# Patient Record
Sex: Male | Born: 1970 | Hispanic: No | Marital: Married | Smoking: Never smoker
Health system: Southern US, Community
[De-identification: ages and names within clinical notes are randomized; demographics above are authoritative.]

## PROBLEM LIST (undated history)

## (undated) DIAGNOSIS — J189 Pneumonia, unspecified organism: Secondary | ICD-10-CM

## (undated) DIAGNOSIS — J45909 Unspecified asthma, uncomplicated: Secondary | ICD-10-CM

## (undated) DIAGNOSIS — K219 Gastro-esophageal reflux disease without esophagitis: Secondary | ICD-10-CM

## (undated) DIAGNOSIS — E785 Hyperlipidemia, unspecified: Secondary | ICD-10-CM

## (undated) DIAGNOSIS — C801 Malignant (primary) neoplasm, unspecified: Secondary | ICD-10-CM

## (undated) DIAGNOSIS — I10 Essential (primary) hypertension: Secondary | ICD-10-CM

## (undated) DIAGNOSIS — E119 Type 2 diabetes mellitus without complications: Secondary | ICD-10-CM

## (undated) DIAGNOSIS — F1110 Opioid abuse, uncomplicated: Secondary | ICD-10-CM

## (undated) HISTORY — DX: Opioid abuse, uncomplicated: F11.10

## (undated) HISTORY — DX: Essential (primary) hypertension: I10

## (undated) HISTORY — DX: Gastro-esophageal reflux disease without esophagitis: K21.9

## (undated) HISTORY — DX: Hyperlipidemia, unspecified: E78.5

## (undated) HISTORY — DX: Type 2 diabetes mellitus without complications: E11.9

## (undated) HISTORY — DX: Malignant (primary) neoplasm, unspecified: C80.1

---

## 2003-08-18 ENCOUNTER — Emergency Department (HOSPITAL_COMMUNITY): Admission: EM | Admit: 2003-08-18 | Discharge: 2003-08-18 | Payer: Self-pay | Admitting: Emergency Medicine

## 2010-09-03 ENCOUNTER — Other Ambulatory Visit: Payer: Self-pay | Admitting: Orthopedic Surgery

## 2010-09-03 DIAGNOSIS — M79603 Pain in arm, unspecified: Secondary | ICD-10-CM

## 2010-09-09 ENCOUNTER — Other Ambulatory Visit: Payer: Self-pay

## 2010-09-14 ENCOUNTER — Other Ambulatory Visit: Payer: Self-pay

## 2010-09-14 DIAGNOSIS — R52 Pain, unspecified: Secondary | ICD-10-CM

## 2010-09-15 ENCOUNTER — Ambulatory Visit
Admission: RE | Admit: 2010-09-15 | Discharge: 2010-09-15 | Disposition: A | Discharge status: Home / Self Care | Payer: 59 | Source: Ambulatory Visit

## 2010-09-15 DIAGNOSIS — R52 Pain, unspecified: Secondary | ICD-10-CM

## 2011-08-01 ENCOUNTER — Ambulatory Visit (INDEPENDENT_AMBULATORY_CARE_PROVIDER_SITE_OTHER): Payer: 59

## 2011-08-01 DIAGNOSIS — R7989 Other specified abnormal findings of blood chemistry: Secondary | ICD-10-CM

## 2011-08-01 DIAGNOSIS — E7800 Pure hypercholesterolemia, unspecified: Secondary | ICD-10-CM

## 2011-08-03 HISTORY — PX: OTHER SURGICAL HISTORY: SHX169

## 2012-07-20 ENCOUNTER — Other Ambulatory Visit: Payer: Self-pay | Admitting: Family Medicine

## 2012-07-21 NOTE — Telephone Encounter (Signed)
Chart pulled and forwarded to pa pool Dos [phone]  bf

## 2012-07-21 NOTE — Telephone Encounter (Signed)
Needs office visit.

## 2012-10-11 ENCOUNTER — Ambulatory Visit (INDEPENDENT_AMBULATORY_CARE_PROVIDER_SITE_OTHER): Payer: 59 | Admitting: Family Medicine

## 2012-10-11 VITALS — BP 140/100 | HR 88 | Temp 98.4°F | Resp 18 | Wt 192.0 lb

## 2012-10-11 DIAGNOSIS — I10 Essential (primary) hypertension: Secondary | ICD-10-CM

## 2012-10-11 DIAGNOSIS — B001 Herpesviral vesicular dermatitis: Secondary | ICD-10-CM

## 2012-10-11 DIAGNOSIS — J309 Allergic rhinitis, unspecified: Secondary | ICD-10-CM

## 2012-10-11 DIAGNOSIS — L0100 Impetigo, unspecified: Secondary | ICD-10-CM | POA: Insufficient documentation

## 2012-10-11 DIAGNOSIS — E785 Hyperlipidemia, unspecified: Secondary | ICD-10-CM

## 2012-10-11 DIAGNOSIS — E7800 Pure hypercholesterolemia, unspecified: Secondary | ICD-10-CM

## 2012-10-11 MED ORDER — MUPIROCIN CALCIUM 2 % EX CREA: TOPICAL_CREAM | Freq: Three times a day (TID) | CUTANEOUS | Status: DC

## 2012-10-11 MED ORDER — PRAVASTATIN SODIUM 40 MG PO TABS: 40.0000 mg | ORAL_TABLET | Freq: Every day | ORAL | Status: DC

## 2012-10-11 MED ORDER — FLUTICASONE PROPIONATE 50 MCG/ACT NA SUSP: 2.0000 | Freq: Every day | NASAL | Status: DC

## 2012-10-11 MED ORDER — VALACYCLOVIR HCL 1 G PO TABS: 1000.0000 mg | ORAL_TABLET | Freq: Two times a day (BID) | ORAL | Status: DC

## 2012-10-11 MED ORDER — AMLODIPINE BESYLATE 10 MG PO TABS: 10.0000 mg | ORAL_TABLET | Freq: Every day | ORAL | Status: DC

## 2012-10-11 NOTE — Assessment & Plan Note (Signed)
Still elevated but not on any medications at this time. Refilled his amlodipine. He will take this daily. Patient will come back in 6 weeks for further checkup. The patient told to keep a log at home of his blood pressures and if his systolic stays above 140 while on medication to come back in for evaluation. The patient does return he will get a basic metabolic panel. Future order in computer.

## 2012-10-11 NOTE — Patient Instructions (Signed)
Very nice to meet you. I will refill your medications. I would like you to come back in 6 weeks and have your labs drawn. I would like you to come back in 8 weeks to see Dr. Perrin Maltese to review your labs and check meds accordingly.  I am giving you a nose spray for your allergies.  I will give you a script for valtrex in case your cold sore comes back.  I am givng you a cream for your mouth to help with the impetigo. Here is some information about it below.     Impetigo Impetigo is an infection of the skin, most common in babies and children.  CAUSES  It is caused by staphylococcal or streptococcal germs (bacteria). Impetigo can start after any damage to the skin. The damage to the skin may be from things like:   Chickenpox.  Scrapes.  Scratches.  Insect bites (common when children scratch the bite).  Cuts.  Nail biting or chewing. Impetigo is contagious. It can be spread from one person to another. Avoid close skin contact, or sharing towels or clothing. SYMPTOMS  Impetigo usually starts out as small blisters or pustules. Then they turn into tiny yellow-crusted sores (lesions).  There may also be:  Large blisters.  Itching or pain.  Pus.  Swollen lymph glands. With scratching, irritation, or non-treatment, these small areas may get larger. Scratching can cause the germs to get under the fingernails; then scratching another part of the skin can cause the infection to be spread there. DIAGNOSIS  Diagnosis of impetigo is usually made by a physical exam. A skin culture (test to grow bacteria) may be done to prove the diagnosis or to help decide the best treatment.  TREATMENT  Mild impetigo can be treated with prescription antibiotic cream. Oral antibiotic medicine may be used in more severe cases. Medicines for itching may be used. HOME CARE INSTRUCTIONS   To avoid spreading impetigo to other body areas:  Keep fingernails short and clean.  Avoid scratching.  Cover infected  areas if necessary to keep from scratching.  Gently wash the infected areas with antibiotic soap and water.  Soak crusted areas in warm soapy water using antibiotic soap.  Gently rub the areas to remove crusts. Do not scrub.  Wash hands often to avoid spread this infection.  Keep children with impetigo home from school or daycare until they have used an antibiotic cream for 48 hours (2 days) or oral antibiotic medicine for 24 hours (1 day), and their skin shows significant improvement.  Children may attend school or daycare if they only have a few sores and if the sores can be covered by a bandage or clothing. SEEK MEDICAL CARE IF:   More blisters or sores show up despite treatment.  Other family members get sores.  Rash is not improving after 48 hours (2 days) of treatment. SEEK IMMEDIATE MEDICAL CARE IF:   You see spreading redness or swelling of the skin around the sores.  You see red streaks coming from the sores.  Your child develops a fever of 100.4 F (37.2 C) or higher.  Your child develops a sore throat.  Your child is acting ill (lethargic, sick to their stomach). Document Released: 07/16/2000 Document Revised: 10/11/2011 Document Reviewed: 05/15/2008 [facility] Patient Information 2013 [place], [place].

## 2012-10-11 NOTE — Progress Notes (Signed)
  Subjective:    Patient ID: Terry [name], male    DOB: [birth year], 42 y.o.   MRN: [id number]  HPI 1. Hypertension Blood pressure at home: not checking Blood pressure today: 140/100 on recheck Taking Meds: no not for 2 months Side effects:none ROS: Denies headache visual changes nausea, vomiting, chest pain or abdominal pain or shortness of breath  Hypercholesterolemia- Has been on pravastatin for quite some time ran out of it a couple months ago. Patient states that it was significantly elevated previously. Unable to find documentation. Patient denies any side effects to the medications and he was taking it.  Patient had a cold sore on the right side of his mouth it has gone away but now he has some dryness of the skin which is fairly painful. Patient states that it has been there for approximately 3 weeks and starting to become very irritating.  Patient is also having some seasonal allergies. Patient has had this multiple years overall and continues over-the-counter antihistamines. Patient says that they seem to improve during the day but gets worse then again at night. Patient denies any fevers or chills or any type of productive cough.  Review of Systems     Objective:   Physical Exam BP 140/120  Pulse 88  Temp(Src) 98.4 F (36.9 C) (Oral)  Resp 18  Wt 192 lb (87.091 kg) General appearance: alert and cooperative Eyes: conjunctivae/corneas clear. PERRL, EOM's intact. Fundi benign. Ears: Tympanic membranes are visualized bilaterally with mild fluid air levels. Nonbulging nonerythematous. Throat: patient has what appears to be impetigo on right concer of mouth.   mild post nasal drip Neck: no adenopathy, supple, symmetrical, trachea midline and thyroid not enlarged, symmetric, no tenderness/mass/nodules Back: symmetric, no curvature. ROM normal. No CVA tenderness. Lungs: clear to auscultation bilaterally Heart: regular rate and rhythm, S1, S2 normal, no murmur, click, rub or  gallop Abdomen: soft, non-tender; bowel sounds normal; no masses,  no organomegaly Extremities: extremities normal, atraumatic, no cyanosis or edema Pulses: 2+ and symmetric Neurologic: Grossly normal     Assessment & Plan:

## 2012-10-11 NOTE — Assessment & Plan Note (Signed)
Patient has history of high cholesterol. Unable to find record stating this. Patient though has been on prednisone in the past and did have it refilled. Patient will return in 6 weeks have labs drawn. Patient will follow up with primary care provider in 8 weeks.

## 2013-01-16 ENCOUNTER — Ambulatory Visit (INDEPENDENT_AMBULATORY_CARE_PROVIDER_SITE_OTHER): Payer: 59 | Admitting: Family Medicine

## 2013-01-16 VITALS — BP 143/91 | HR 78 | Temp 98.4°F | Resp 17 | Ht 70.5 in | Wt 190.0 lb

## 2013-01-16 DIAGNOSIS — J309 Allergic rhinitis, unspecified: Secondary | ICD-10-CM

## 2013-01-16 DIAGNOSIS — B009 Herpesviral infection, unspecified: Secondary | ICD-10-CM

## 2013-01-16 DIAGNOSIS — I10 Essential (primary) hypertension: Secondary | ICD-10-CM

## 2013-01-16 DIAGNOSIS — R42 Dizziness and giddiness: Secondary | ICD-10-CM

## 2013-01-16 DIAGNOSIS — E785 Hyperlipidemia, unspecified: Secondary | ICD-10-CM

## 2013-01-16 LAB — POCT CBC
Granulocyte percent: 70.2 %G (ref 37–80)
HCT, POC: 52.3 % (ref 43.5–53.7)
Hemoglobin: 17.2 g/dL (ref 14.1–18.1)
Lymph, poc: 1.7 (ref 0.6–3.4)
MCHC: 32.9 g/dL (ref 31.8–35.4)
MPV: 6.7 fL (ref 0–99.8)
POC Granulocyte: 5.5 (ref 2–6.9)
POC MID %: 7.7 %M (ref 0–12)
RBC: 5.7 M/uL (ref 4.69–6.13)

## 2013-01-16 LAB — POCT URINALYSIS DIPSTICK
Blood, UA: NEGATIVE
Glucose, UA: NEGATIVE
Nitrite, UA: NEGATIVE
Spec Grav, UA: >=1.03
Urobilinogen, UA: 0.2
pH, UA: 5.5

## 2013-01-16 LAB — GLUCOSE, POCT (MANUAL RESULT ENTRY): POC Glucose: 91 mg/dl (ref 70–99)

## 2013-01-16 MED ORDER — AMLODIPINE BESYLATE 10 MG PO TABS: 10.0000 mg | ORAL_TABLET | Freq: Every day | ORAL | Status: DC

## 2013-01-16 MED ORDER — PRAVASTATIN SODIUM 40 MG PO TABS: 40.0000 mg | ORAL_TABLET | Freq: Every day | ORAL | Status: DC

## 2013-01-16 MED ORDER — MECLIZINE HCL 25 MG PO TABS: 25.0000 mg | ORAL_TABLET | Freq: Three times a day (TID) | ORAL | Status: DC | PRN

## 2013-01-16 MED ORDER — VALACYCLOVIR HCL 1 G PO TABS: ORAL_TABLET | ORAL | Status: DC

## 2013-01-16 NOTE — Patient Instructions (Addendum)
At onset of cold sore symptoms - take 2 of Valtrex 1 gram tablets, then repeat this dose once in 12 hours. If frequent outbreaks - return to discuss daily medications.  Restart blood pressure and cholesterol medicines and recheck fasting office visit in next 2 - 3 months. Keep a record of your blood pressures outside of the office and bring them to the next office visit. Ok to restart flonase for allergies and nasal congestion.  Increase water and fluids, decrease caffiene by 1/2.  If not improved tomorrow - can try meclizine.  If still not improved in 3-4 days with increasing fluid intake - recheck. You should receive a call or letter about your lab results within the next week to 10 days.  Return to the clinic or go to the nearest emergency room if any of your symptoms worsen or new symptoms occur.  Dizziness Dizziness is a common problem. It is a feeling of unsteadiness or lightheadedness. You may feel like you are about to faint. Dizziness can lead to injury if you stumble or fall. A person of any age group can suffer from dizziness, but dizziness is more common in older adults. CAUSES  Dizziness can be caused by many different things, including:  Middle ear problems.  Standing for too long.  Infections.  An allergic reaction.  Aging.  An emotional response to something, such as the sight of blood.  Side effects of medicines.  Fatigue.  Problems with circulation or blood pressure.  Excess use of alcohol, medicines, or illegal drug use.  Breathing too fast (hyperventilation).  An arrhythmia or problems with your heart rhythm.  Low red blood cell count (anemia).  Pregnancy.  Vomiting, diarrhea, fever, or other illnesses that cause dehydration.  Diseases or conditions such as Parkinson's disease, high blood pressure (hypertension), diabetes, and thyroid problems.  Exposure to extreme heat. DIAGNOSIS  To find the cause of your dizziness, your caregiver may do a physical  exam, lab tests, radiologic imaging scans, or an electrocardiography test (ECG).  TREATMENT  Treatment of dizziness depends on the cause of your symptoms and can vary greatly. HOME CARE INSTRUCTIONS   Drink enough fluids to keep your urine clear or pale yellow. This is especially important in very hot weather. In the elderly, it is also important in cold weather.  If your dizziness is caused by medicines, take them exactly as directed. When taking blood pressure medicines, it is especially important to get up slowly.  Rise slowly from chairs and steady yourself until you feel okay.  In the morning, first sit up on the side of the bed. When this seems okay, stand slowly while holding onto something until you know your balance is fine.  If you need to stand in one place for a long time, be sure to move your legs often. Tighten and relax the muscles in your legs while standing.  If dizziness continues to be a problem, have someone stay with you for a day or two. Do this until you feel you are well enough to stay alone. Have the person call your caregiver if he or she notices changes in you that are concerning.  Do not drive or use heavy machinery if you feel dizzy.  Do not drink alcohol. SEEK IMMEDIATE MEDICAL CARE IF:   Your dizziness or lightheadedness gets worse.  You feel nauseous or vomit.  You develop problems with talking, walking, weakness, or using your arms, hands, or legs.  You are not thinking clearly or  you have difficulty forming sentences. It may take a friend or family member to determine if your thinking is normal.  You develop chest pain, abdominal pain, shortness of breath, or sweating.  Your vision changes.  You notice any bleeding.  You have side effects from medicine that seems to be getting worse rather than better. MAKE SURE YOU:   Understand these instructions.  Will watch your condition.  Will get help right away if you are not doing well or get  worse. Document Released: 01/12/2001 Document Revised: 10/11/2011 Document Reviewed: 02/05/2011 [facility] Patient Information 2014 [place], [place].

## 2013-01-16 NOTE — Progress Notes (Signed)
Subjective:    Patient ID: Terry [name], male    DOB: 1971/06/13, 42 y.o.   MRN: [id number]  HPI Tyjay Galindo is a 42 y.o. male Hx of HTN, hyperlipidemia presenting with dizziness/lightheadedness.  Started past 3-4 days. Out on the beach Sunday when first noticed. Noticed with standing.  Only had 1 beer at the time. Did not feel overheated, but had only been drinking diet pepsi's.  No chest pain/palpitations. No ha/focal weakness/slurred speech. No syncope. Notes more with standing, not with head movement. No personal hx of cardiac disease, no fh of early CAD.    Hx of cold sores - out of valtrex. Episodic flairs - sometimes few per month, sometimes months without.   Ran out of blood pressure medicine and cholesterol medicine past few months.   SH: 5 diet pepsi per day. No other caffeine. Nonsmoker. Few drinks per week. Rare marijuana about a month ago.  22, 21,and 64 yo children.    Past Medical History  Diagnosis Date  . Hyperlipidemia   . Hypertension    History reviewed. No pertinent past surgical history. No Known Allergies Prior to Admission medications   Medication Sig Start Date End Date Taking? Authorizing Provider  amLODipine (NORVASC) 10 MG tablet Take 1 tablet (10 mg total) by mouth daily. 10/11/12  Yes Judi Saa, DO  pravastatin (PRAVACHOL) 40 MG tablet Take 1 tablet (40 mg total) by mouth daily. 10/11/12  Yes Judi Saa, DO   History   Social History  . Marital Status: Married    Spouse Name: N/A    Number of Children: N/A  . Years of Education: N/A   Occupational History  . Not on file.   Social History Main Topics  . Smoking status: Never Smoker   . Smokeless tobacco: Not on file  . Alcohol Use: Yes  . Drug Use: No  . Sexually Active: Yes   Other Topics Concern  . Not on file   Social History Narrative  . No narrative on file    Review of Systems  Constitutional: Negative for fever and chills.  HENT: Negative for congestion (slight  congestion this summer with allergies. no recent nasal spray or allergy meds. ), facial swelling, rhinorrhea, neck stiffness and sinus pressure.   Eyes: Negative for photophobia and visual disturbance.  Respiratory: Negative for chest tightness and shortness of breath.   Cardiovascular: Negative for chest pain, palpitations and leg swelling.  Gastrointestinal: Negative for vomiting, abdominal pain, diarrhea, blood in stool and anal bleeding.  Genitourinary: Negative for difficulty urinating.       Dark yellow urine.   Neurological: Positive for dizziness and light-headedness. Negative for tremors, seizures, syncope, facial asymmetry, speech difficulty, weakness and headaches.      Objective:   Physical Exam  Vitals reviewed. Constitutional: He is oriented to person, place, and time. He appears well-developed and well-nourished.  HENT:  Head: Normocephalic and atraumatic.  Eyes: Pupils are equal, round, and reactive to light. Right eye exhibits nystagmus. Left eye exhibits nystagmus (1-2 beats of horizontal nystagmus looking left only with partial reproduction of symptoms. ).  Neck: No JVD present. Carotid bruit is not present.  Cardiovascular: Normal rate, regular rhythm and normal heart sounds.   No murmur heard. Pulmonary/Chest: Effort normal and breath sounds normal. He has no rales.  Musculoskeletal: He exhibits no edema.  Neurological: He is alert and oriented to person, place, and time. No cranial nerve deficit. Coordination and gait normal. GCS eye subscore is 4.  GCS verbal subscore is 5. GCS motor subscore is 6.  Skin: Skin is warm and dry.  Psychiatric: He has a normal mood and affect.    EKG: TWI in III only, early repol precordial leads. No acute findings otherwise.   Results for orders placed in visit on 01/16/13  GLUCOSE, POCT (MANUAL RESULT ENTRY)      Result Value Range   POC Glucose 91  70 - 99 mg/dl  POCT URINALYSIS DIPSTICK      Result Value Range   Color, UA  yellow     Clarity, UA clear     Glucose, UA neg     Bilirubin, UA small     Ketones, UA 15     Spec Grav, UA >=1.030     Blood, UA neg     pH, UA 5.5     Protein, UA trace     Urobilinogen, UA 0.2     Nitrite, UA neg     Leukocytes, UA Negative    POCT CBC      Result Value Range   WBC 7.8  4.6 - 10.2 K/uL   Lymph, poc 1.7  0.6 - 3.4   POC LYMPH PERCENT 22.1  10 - 50 %L   MID (cbc) 0.6  0 - 0.9   POC MID % 7.7  0 - 12 %M   POC Granulocyte 5.5  2 - 6.9   Granulocyte percent 70.2  37 - 80 %G   RBC 5.70  4.69 - 6.13 M/uL   Hemoglobin 17.2  14.1 - 18.1 g/dL   HCT, POC 16.1  09.6 - 53.7 %   MCV 91.8  80 - 97 fL   MCH, POC 30.2  27 - 31.2 pg   MCHC 32.9  31.8 - 35.4 g/dL   RDW, POC 04.5     Platelet Count, POC 470 (*) 142 - 424 K/uL   MPV 6.7  0 - 99.8 fL      Assessment & Plan:  Trendon Zaring is a 42 y.o. male Dizziness - Plan: EKG 12-Lead, Comprehensive metabolic panel, POCT glucose (manual entry), POCT urinalysis dipstick, POCT CBC, TSH.  mulifactorial likely with relative volume depletion - decrease soda intake and increase water, ETD with allergic rhinitis/vertigo with slight nystagmus.  nonfocal exam and unlikely any acute findings on EKG.  Increase fluid intake, restart pravachol and norvasc for HTN, flonase for AR,  meclizine if needed, but recehck next few days if not improving with above.  ER/RTC precautions discussed.  Was oow yesterday and today.  HTN (hypertension) - Plan: Comprehensive metabolic panel, amLODipine (NORVASC) 10 MG tablet refilled. Outside BP record to next ov.   Other and unspecified hyperlipidemia - Plan: Comprehensive metabolic panel, pravastatin (PRAVACHOL) 40 MG tablet, restarted.  Plan on fasting visiting in next 2-3 months for lipids and to assess BP on meds.   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 Discussed use.   Allergic rhinitis - restart flonase NS.    Meds ordered this encounter  Medications  . valACYclovir  (VALTREX) 1000 MG tablet    Sig: Take as instructed.    Dispense:  30 tablet    Refill:  0  . pravastatin (PRAVACHOL) 40 MG tablet    Sig: Take 1 tablet (40 mg total) by mouth daily.    Dispense:  90 tablet    Refill:  1  . amLODipine (NORVASC) 10 MG tablet    Sig: Take 1 tablet (10 mg total)  by mouth daily.    Dispense:  90 tablet    Refill:  1  . meclizine (ANTIVERT) 25 MG tablet    Sig: Take 1 tablet (25 mg total) by mouth 3 (three) times daily as needed.    Dispense:  30 tablet    Refill:  0   Patient Instructions  At onset of cold sore symptoms - take 2 of Valtrex 1 gram tablets, then repeat this dose once in 12 hours. If frequent outbreaks - return to discuss daily medications.  Restart blood pressure and cholesterol medicines and recheck fasting office visit in next 2 - 3 months. Keep a record of your blood pressures outside of the office and bring them to the next office visit. Ok to restart flonase for allergies and nasal congestion.  Increase water and fluids, decrease caffiene by 1/2.  If not improved tomorrow - can try meclizine.  If still not improved in 3-4 days with increasing fluid intake - recheck. You should receive a call or letter about your lab results within the next week to 10 days.  Return to the clinic or go to the nearest emergency room if any of your symptoms worsen or new symptoms occur.  Dizziness Dizziness is a common problem. It is a feeling of unsteadiness or lightheadedness. You may feel like you are about to faint. Dizziness can lead to injury if you stumble or fall. A person of any age group can suffer from dizziness, but dizziness is more common in older adults. CAUSES  Dizziness can be caused by many different things, including:  Middle ear problems.  Standing for too long.  Infections.  An allergic reaction.  Aging.  An emotional response to something, such as the sight of blood.  Side effects of medicines.  Fatigue.  Problems with  circulation or blood pressure.  Excess use of alcohol, medicines, or illegal drug use.  Breathing too fast (hyperventilation).  An arrhythmia or problems with your heart rhythm.  Low red blood cell count (anemia).  Pregnancy.  Vomiting, diarrhea, fever, or other illnesses that cause dehydration.  Diseases or conditions such as Parkinson's disease, high blood pressure (hypertension), diabetes, and thyroid problems.  Exposure to extreme heat. DIAGNOSIS  To find the cause of your dizziness, your caregiver may do a physical exam, lab tests, radiologic imaging scans, or an electrocardiography test (ECG).  TREATMENT  Treatment of dizziness depends on the cause of your symptoms and can vary greatly. HOME CARE INSTRUCTIONS   Drink enough fluids to keep your urine clear or pale yellow. This is especially important in very hot weather. In the elderly, it is also important in cold weather.  If your dizziness is caused by medicines, take them exactly as directed. When taking blood pressure medicines, it is especially important to get up slowly.  Rise slowly from chairs and steady yourself until you feel okay.  In the morning, first sit up on the side of the bed. When this seems okay, stand slowly while holding onto something until you know your balance is fine.  If you need to stand in one place for a long time, be sure to move your legs often. Tighten and relax the muscles in your legs while standing.  If dizziness continues to be a problem, have someone stay with you for a day or two. Do this until you feel you are well enough to stay alone. Have the person call your caregiver if he or she notices changes in you that are  concerning.  Do not drive or use heavy machinery if you feel dizzy.  Do not drink alcohol. SEEK IMMEDIATE MEDICAL CARE IF:   Your dizziness or lightheadedness gets worse.  You feel nauseous or vomit.  You develop problems with talking, walking, weakness, or using  your arms, hands, or legs.  You are not thinking clearly or you have difficulty forming sentences. It may take a friend or family member to determine if your thinking is normal.  You develop chest pain, abdominal pain, shortness of breath, or sweating.  Your vision changes.  You notice any bleeding.  You have side effects from medicine that seems to be getting worse rather than better. MAKE SURE YOU:   Understand these instructions.  Will watch your condition.  Will get help right away if you are not doing well or get worse. Document Released: 01/12/2001 Document Revised: 10/11/2011 Document Reviewed: 02/05/2011 [facility] Patient Information 2014 [place], [place].

## 2013-01-17 LAB — COMPREHENSIVE METABOLIC PANEL
ALT: 21 U/L (ref 0–53)
AST: 14 U/L (ref 0–37)
Albumin: 4.4 g/dL (ref 3.5–5.2)
CO2: 24 mEq/L (ref 19–32)
Calcium: 10 mg/dL (ref 8.4–10.5)
Chloride: 101 mEq/L (ref 96–112)
Creat: 1.25 mg/dL (ref 0.50–1.35)
Potassium: 4.1 mEq/L (ref 3.5–5.3)
Total Protein: 7.1 g/dL (ref 6.0–8.3)

## 2013-04-02 HISTORY — PX: TRICEPS TENDON REPAIR: SHX2577

## 2013-04-19 ENCOUNTER — Other Ambulatory Visit: Payer: Self-pay | Admitting: Orthopedic Surgery

## 2013-04-19 DIAGNOSIS — M25522 Pain in left elbow: Secondary | ICD-10-CM

## 2013-04-20 ENCOUNTER — Ambulatory Visit
Admission: RE | Admit: 2013-04-20 | Discharge: 2013-04-20 | Disposition: A | Discharge status: Home / Self Care | Payer: 59 | Source: Ambulatory Visit | Attending: Orthopedic Surgery | Admitting: Orthopedic Surgery

## 2013-04-20 DIAGNOSIS — M25522 Pain in left elbow: Secondary | ICD-10-CM

## 2013-10-18 ENCOUNTER — Ambulatory Visit (INDEPENDENT_AMBULATORY_CARE_PROVIDER_SITE_OTHER): Payer: BC Managed Care – PPO | Admitting: Emergency Medicine

## 2013-10-18 VITALS — BP 128/88 | HR 88 | Temp 98.1°F | Resp 16 | Ht 68.5 in | Wt 185.8 lb

## 2013-10-18 DIAGNOSIS — I10 Essential (primary) hypertension: Secondary | ICD-10-CM

## 2013-10-18 DIAGNOSIS — E785 Hyperlipidemia, unspecified: Secondary | ICD-10-CM

## 2013-10-18 DIAGNOSIS — Z9119 Patient's noncompliance with other medical treatment and regimen: Secondary | ICD-10-CM

## 2013-10-18 DIAGNOSIS — B009 Herpesviral infection, unspecified: Secondary | ICD-10-CM

## 2013-10-18 DIAGNOSIS — E7800 Pure hypercholesterolemia, unspecified: Secondary | ICD-10-CM

## 2013-10-18 DIAGNOSIS — Z91199 Patient's noncompliance with other medical treatment and regimen due to unspecified reason: Secondary | ICD-10-CM

## 2013-10-18 MED ORDER — AMLODIPINE BESYLATE 10 MG PO TABS: 10.0000 mg | ORAL_TABLET | Freq: Every day | ORAL | Status: DC

## 2013-10-18 MED ORDER — VALACYCLOVIR HCL 1 G PO TABS: ORAL_TABLET | ORAL | Status: DC

## 2013-10-18 MED ORDER — VALACYCLOVIR HCL 1 G PO TABS: 1000.0000 mg | ORAL_TABLET | Freq: Three times a day (TID) | ORAL | Status: DC

## 2013-10-18 MED ORDER — PRAVASTATIN SODIUM 40 MG PO TABS: 40.0000 mg | ORAL_TABLET | Freq: Every day | ORAL | Status: DC

## 2013-10-18 NOTE — Patient Instructions (Signed)

## 2013-10-18 NOTE — Progress Notes (Signed)
[facility] [address] [place] [address] [phone]  Date:  10/18/2013   Name:  Terry [name]   DOB:  [birth year]   MRN:  [id number]  PCP:  Kennon Portela, MD    Chief Complaint: Medication Refill, Headache and Dizziness   History of Present Illness:  Terry [name] is a 43 y.o. very pleasant male patient who presents with the following:  Has a history of hypertension and hyperlipidemia. Has been off his meds for 2 months.  Yesterday he noted his blood pressure to be 170/100 and today woke up with a headache and took a neighbor's medication.  He is now asymptomatic.  He has a current outbreak of vesicular lesions on his face and has been treated for herpes Type 1 but has never been tested.  No improvement with over the counter medications or other home remedies. Denies other complaint or health concern today.   Patient Active Problem List   Diagnosis Date Noted  . HTN (hypertension) 10/11/2012  . Pure hypercholesterolemia 10/11/2012  . Allergic rhinitis 10/11/2012  . Impetigo 10/11/2012  . 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 Hyperlipidemia   . Hypertension     Past Surgical History  Procedure Laterality Date  . Triceps tendon repair Left 04/2013    History  Substance Use Topics  . Smoking status: Never Smoker   . Smokeless tobacco: Not on file  . Alcohol Use: Yes    Family History  Problem Relation Age of Onset  . Diabetes Mother   . Diabetes Father     No Known Allergies  Medication list has been reviewed and updated.  Current Outpatient Prescriptions on File Prior to Visit  Medication Sig Dispense Refill  . amLODipine (NORVASC) 10 MG tablet Take 1 tablet (10 mg total) by mouth daily.  90 tablet  1  . pravastatin (PRAVACHOL) 40 MG tablet Take 1 tablet (40 mg total) by mouth daily.  90 tablet  1  . valACYclovir (VALTREX) 1000 MG tablet Take as instructed.  30 tablet  0   No current  facility-administered medications on file prior to visit.    Review of Systems:  As per HPI, otherwise negative.   Physical Examination: Filed Vitals:   10/18/13 1856  BP: 128/88  Pulse: 88  Temp: 98.1 F (36.7 C)  Resp: 16   Filed Vitals:   10/18/13 1856  Height: 5' 8.5" (1.74 m)  Weight: 185 lb 12.8 oz (84.278 kg)   Body mass index is 27.84 kg/(m^2). Ideal Body Weight: Weight in (lb) to have BMI = 25: 166.5  GEN: WDWN, NAD, Non-toxic, A & O x 3 HEENT: Atraumatic, Normocephalic. Neck supple. No masses, No LAD. Ears and Nose: No external deformity. CV: RRR, No M/G/R. No JVD. No thrill. No extra heart sounds. PULM: CTA B, no wheezes, crackles, rhonchi. No retractions. No resp. distress. No accessory muscle use. ABD: S, NT, ND, +BS. No rebound. No HSM. EXTR: No c/c/e NEURO Normal gait.  PSYCH: Normally interactive. Conversant. Not depressed or anxious appearing.  Calm demeanor.  Skin:  Vesicular eruption on right side of face consistent with herpes with two patches of coalescent lesions.    Assessment and Plan: Hypertension Hyperlipidemia Herpes  Continue valtrex Resume meds Discussed need to make an informed judgement about his meds and the need to follow up while he is on the medications.  Signed,  Ellison Carwin, MD

## 2013-10-22 LAB — HERPES SIMPLEX VIRUS CULTURE: ORGANISM ID, BACTERIA: DETECTED

## 2014-12-07 ENCOUNTER — Emergency Department (HOSPITAL_COMMUNITY)
Admission: EM | Admit: 2014-12-07 | Discharge: 2014-12-07 | Disposition: A | Discharge status: Home / Self Care | Payer: Self-pay | Attending: Emergency Medicine | Admitting: Emergency Medicine

## 2014-12-07 ENCOUNTER — Encounter (HOSPITAL_COMMUNITY): Payer: Self-pay | Admitting: Emergency Medicine

## 2014-12-07 ENCOUNTER — Emergency Department (HOSPITAL_COMMUNITY): Payer: Self-pay

## 2014-12-07 DIAGNOSIS — J189 Pneumonia, unspecified organism: Secondary | ICD-10-CM

## 2014-12-07 DIAGNOSIS — Z79899 Other long term (current) drug therapy: Secondary | ICD-10-CM | POA: Insufficient documentation

## 2014-12-07 DIAGNOSIS — E785 Hyperlipidemia, unspecified: Secondary | ICD-10-CM | POA: Insufficient documentation

## 2014-12-07 DIAGNOSIS — R062 Wheezing: Secondary | ICD-10-CM

## 2014-12-07 DIAGNOSIS — I10 Essential (primary) hypertension: Secondary | ICD-10-CM | POA: Insufficient documentation

## 2014-12-07 DIAGNOSIS — J159 Unspecified bacterial pneumonia: Secondary | ICD-10-CM | POA: Insufficient documentation

## 2014-12-07 LAB — CBC WITH DIFFERENTIAL/PLATELET
BASOS PCT: 1 % (ref 0–1)
Basophils Absolute: 0.1 10*3/uL (ref 0.0–0.1)
EOS ABS: 1.6 10*3/uL — AB (ref 0.0–0.7)
Eosinophils Relative: 15 % — ABNORMAL HIGH (ref 0–5)
HCT: 48.6 % (ref 39.0–52.0)
HEMOGLOBIN: 17 g/dL (ref 13.0–17.0)
LYMPHS PCT: 21 % (ref 12–46)
Lymphs Abs: 2.2 10*3/uL (ref 0.7–4.0)
MCH: 29.4 pg (ref 26.0–34.0)
MCHC: 35 g/dL (ref 30.0–36.0)
MCV: 84.1 fL (ref 78.0–100.0)
Monocytes Absolute: 0.7 10*3/uL (ref 0.1–1.0)
Monocytes Relative: 7 % (ref 3–12)
Neutro Abs: 6.1 10*3/uL (ref 1.7–7.7)
Neutrophils Relative %: 56 % (ref 43–77)
PLATELETS: 335 10*3/uL (ref 150–400)
RBC: 5.78 MIL/uL (ref 4.22–5.81)
RDW: 12.1 % (ref 11.5–15.5)
WBC: 10.8 10*3/uL — AB (ref 4.0–10.5)

## 2014-12-07 LAB — BASIC METABOLIC PANEL
Anion gap: 5 (ref 5–15)
BUN: 18 mg/dL (ref 6–20)
CO2: 26 mmol/L (ref 22–32)
Calcium: 9 mg/dL (ref 8.9–10.3)
Chloride: 107 mmol/L (ref 101–111)
Creatinine, Ser: 1.14 mg/dL (ref 0.61–1.24)
GFR calc Af Amer: >60 mL/min (ref >60)
GFR calc non Af Amer: >60 mL/min (ref >60)
GLUCOSE: 148 mg/dL — AB (ref 70–99)
POTASSIUM: 3.8 mmol/L (ref 3.5–5.1)
SODIUM: 138 mmol/L (ref 135–145)

## 2014-12-07 MED ORDER — IPRATROPIUM-ALBUTEROL 0.5-2.5 (3) MG/3ML IN SOLN
3.0000 mL | Freq: Once | RESPIRATORY_TRACT | Status: AC
  Administered 2014-12-07: 3 mL via RESPIRATORY_TRACT
  Filled 2014-12-07: qty 3

## 2014-12-07 MED ORDER — AZITHROMYCIN 250 MG PO TABS: 250.0000 mg | ORAL_TABLET | Freq: Every day | ORAL | Status: DC

## 2014-12-07 NOTE — ED Provider Notes (Signed)
CSN: [id number]     Arrival date & time 12/07/14  1635 History   First MD Initiated Contact with Patient 12/07/14 1653     Chief Complaint  Patient presents with  . Shortness of Breath     (Consider location/radiation/quality/duration/timing/severity/associated sxs/prior Treatment) Patient is a 44 y.o. male presenting with shortness of breath. The history is provided by the patient. No language interpreter was used.  Shortness of Breath Associated symptoms: cough   Associated symptoms: no fever, no sore throat and no vomiting   Mr. Gut is a 44 y.o male with a history of HTN and hyperlipidemia who presents for new onset shortness of breath that has worsened over the last week.  He has had a non productive cough for the past couple of days. He states he has been wheezing which has been audible to his wife while sitting across the room.  It is worse with exertion and he states even walk back to the ED room made it worse.  He has never had this before. He denies smoking cigarettes.  He denies a history of asthma or COPD.  He denies any fever, chills, headache, or sore throat.  Past Medical History  Diagnosis Date  . Hyperlipidemia   . Hypertension    Past Surgical History  Procedure Laterality Date  . Triceps tendon repair Left 04/2013   Family History  Problem Relation Age of Onset  . Diabetes Mother   . Diabetes Father    History  Substance Use Topics  . Smoking status: Never Smoker   . Smokeless tobacco: Not on file  . Alcohol Use: Yes    Review of Systems  Constitutional: Negative for fever.  HENT: Negative for sore throat.   Respiratory: Positive for cough and shortness of breath.   Gastrointestinal: Negative for nausea and vomiting.  All other systems reviewed and are negative.     Allergies  Review of patient's allergies indicates no known allergies.  Home Medications   Prior to Admission medications   Medication Sig Start Date End Date Taking? Authorizing  Provider  Phenylephrine-DM-GG 2.5-5-100 MG/5ML LIQD Take 30 mLs by mouth every 4 (four) hours as needed (for cold and cough).   Yes Historical Provider, MD  amLODipine (NORVASC) 10 MG tablet Take 1 tablet (10 mg total) by mouth daily. Patient not taking: Reported on 12/07/2014 10/18/13   Roselee Culver, MD  azithromycin (ZITHROMAX) 250 MG tablet Take 1 tablet (250 mg total) by mouth daily. Take first 2 tablets together, then 1 every day until finished. 12/07/14   Reynard Christoffersen Patel-Mills, PA-C  pravastatin (PRAVACHOL) 40 MG tablet Take 1 tablet (40 mg total) by mouth daily. Patient not taking: Reported on 12/07/2014 10/18/13   Roselee Culver, MD  valACYclovir (VALTREX) 1000 MG tablet Take as instructed. Patient not taking: Reported on 12/07/2014 10/18/13   Roselee Culver, MD  valACYclovir (VALTREX) 1000 MG tablet Take 1 tablet (1,000 mg total) by mouth 3 (three) times daily. Patient not taking: Reported on 12/07/2014 10/18/13   Roselee Culver, MD   BP 166/118 mmHg  Pulse 88  Temp(Src) 98 F (36.7 C) (Oral)  Resp 22  SpO2 93% Physical Exam  Constitutional: He is oriented to person, place, and time. He appears well-developed and well-nourished.  HENT:  Head: Normocephalic and atraumatic.  Eyes: Conjunctivae are normal.  Neck: Normal range of motion. Neck supple.  Cardiovascular: Normal rate, regular rhythm and normal heart sounds.   Pulmonary/Chest: Effort normal. No accessory muscle usage.  No respiratory distress. He has wheezes in the right middle field, the left middle field and the left lower field. He has no rales.  Patient receiving neb treatment.   Abdominal: Soft. There is no tenderness.  Musculoskeletal: Normal range of motion. He exhibits no edema.  Neurological: He is alert and oriented to person, place, and time.  Skin: Skin is warm and dry.  Nursing note and vitals reviewed.   ED Course  Procedures (including critical care time) Labs Review Labs Reviewed  CBC WITH  DIFFERENTIAL/PLATELET - Abnormal; Notable for the following:    WBC 10.8 (*)    Eosinophils Relative 15 (*)    Eosinophils Absolute 1.6 (*)    All other components within normal limits  BASIC METABOLIC PANEL - Abnormal; Notable for the following:    Glucose, Bld 148 (*)    All other components within normal limits    Imaging Review Dg Chest 2 View  12/07/2014   CLINICAL DATA:  Shortness of breath for 2 days  EXAM: CHEST  2 VIEW  COMPARISON:  None.  FINDINGS: Cardiac shadow is within normal limits. The lungs are well aerated bilaterally. Some fullness is noted in the right infrahilar region which may represent some early infiltrate. Short-term followup following appropriate therapy is recommended. No other focal abnormality is seen. No bony abnormality is noted.  IMPRESSION: Increased density in the right infrahilar region likely representing an early infiltrate. Followup PA and lateral chest X-ray is recommended in 3-4 weeks following trial of antibiotic therapy to ensure resolution and exclude underlying malignancy.   Electronically Signed   By: Inez Catalina M.D.   On: 12/07/2014 17:47     EKG Interpretation None      MDM   Final diagnoses:  Wheezing  Community acquired pneumonia  Patient presents for shortness of breath for the past week.  No history of asthma, no sore throat, no chest pain. He has bilateral lower lobe wheezing on exam L>R.   17:58 He states he is feeling "wonderful" after nebulizer treatment.  His chest xray shows right infrahilar region likely representing an early infiltrate.  He is non toxic, not hypoxic and not febrile. His WBC is 10.8 and his BMP is normal.  I will put him on azithromycin and he can follow up with his pcp in 2 days using the resource guide.  I also discussed getting a repeat chest xray in 3-4 weeks. I have given him strict return precautions. He agrees with the plan.      Ottie Glazier, PA-C 12/07/14 1846  Quintella Reichert, MD 12/07/14  Curly Rim

## 2014-12-07 NOTE — Discharge Instructions (Signed)
Pneumonia °Pneumonia is an infection of the lungs.  °CAUSES °Pneumonia may be caused by bacteria or a virus. Usually, these infections are caused by breathing infectious particles into the lungs (respiratory tract). °SIGNS AND SYMPTOMS  °· Cough. °· Fever. °· Chest pain. °· Increased rate of breathing. °· Wheezing. °· Mucus production. °DIAGNOSIS  °If you have the common symptoms of pneumonia, your health care provider will typically confirm the diagnosis with a chest X-ray. The X-ray will show an abnormality in the lung (pulmonary infiltrate) if you have pneumonia. Other tests of your blood, urine, or sputum may be done to find the specific cause of your pneumonia. Your health care provider may also do tests (blood gases or pulse oximetry) to see how well your lungs are working. °TREATMENT  °Some forms of pneumonia may be spread to other people when you cough or sneeze. You may be asked to wear a mask before and during your exam. Pneumonia that is caused by bacteria is treated with antibiotic medicine. Pneumonia that is caused by the influenza virus may be treated with an antiviral medicine. Most other viral infections must run their course. These infections will not respond to antibiotics.  °HOME CARE INSTRUCTIONS  °· Cough suppressants may be used if you are losing too much rest. However, coughing protects you by clearing your lungs. You should avoid using cough suppressants if you can. °· Your health care provider may have prescribed medicine if he or she thinks your pneumonia is caused by bacteria or influenza. Finish your medicine even if you start to feel better. °· Your health care provider may also prescribe an expectorant. This loosens the mucus to be coughed up. °· Take medicines only as directed by your health care provider. °· Do not smoke. Smoking is a common cause of bronchitis and can contribute to pneumonia. If you are a smoker and continue to smoke, your cough may last several weeks after your  pneumonia has cleared. °· A cold steam vaporizer or humidifier in your room or home may help loosen mucus. °· Coughing is often worse at night. Sleeping in a semi-upright position in a recliner or using a couple pillows under your head will help with this. °· Get rest as you feel it is needed. Your body will usually let you know when you need to rest. °PREVENTION °A pneumococcal shot (vaccine) is available to prevent a common bacterial cause of pneumonia. This is usually suggested for: °· People over 65 years old. °· Patients on chemotherapy. °· People with chronic lung problems, such as bronchitis or emphysema. °· People with immune system problems. °If you are over 65 or have a high risk condition, you may receive the pneumococcal vaccine if you have not received it before. In some countries, a routine influenza vaccine is also recommended. This vaccine can help prevent some cases of pneumonia. You may be offered the influenza vaccine as part of your care. °If you smoke, it is time to quit. You may receive instructions on how to stop smoking. Your health care provider can provide medicines and counseling to help you quit. °SEEK MEDICAL CARE IF: °You have a fever. °SEEK IMMEDIATE MEDICAL CARE IF:  °· Your illness becomes worse. This is especially true if you are elderly or weakened from any other disease. °· You cannot control your cough with suppressants and are losing sleep. °· You begin coughing up blood. °· You develop pain which is getting worse or is uncontrolled with medicines. °· Any of the symptoms   which initially brought you in for treatment are getting worse rather than better. °· You develop shortness of breath or chest pain. °MAKE SURE YOU:  °· Understand these instructions. °· Will watch your condition. °· Will get help right away if you are not doing well or get worse. °Document Released: 07/19/2005 Document Revised: 12/03/2013 Document Reviewed: 10/08/2010 °ExitCare® Patient Information ©2015  [employer]. This information is not intended to replace advice given to you by your health care provider. Make sure you discuss any questions you have with your health care provider. ° ° °Emergency Department Resource Guide °1) Find a Doctor and Pay Out of Pocket °Although you won't have to find out who is covered by your insurance plan, it is a good idea to ask around and get recommendations. You will then need to call the office and see if the doctor you have chosen will accept you as a new patient and what types of options they offer for patients who are self-pay. Some doctors offer discounts or will set up payment plans for their patients who do not have insurance, but you will need to ask so you aren't surprised when you get to your appointment. ° °2) Contact Your Local Health Department °Not all health departments have doctors that can see patients for sick visits, but many do, so it is worth a call to see if yours does. If you don't know where your local health department is, you can check in your phone book. The CDC also has a tool to help you locate your state's health department, and many state websites also have listings of all of their local health departments. ° °3) Find a Walk-in Clinic °If your illness is not likely to be very severe or complicated, you may want to try a walk in clinic. These are popping up all over the country in pharmacies, drugstores, and shopping centers. They're usually staffed by nurse practitioners or physician assistants that have been trained to treat common illnesses and complaints. They're usually fairly quick and inexpensive. However, if you have serious medical issues or chronic medical problems, these are probably not your best option. ° °No Primary Care Doctor: °- Call [facility] at  832-8000 - they can help you locate a primary care doctor that  accepts your insurance, provides certain services, etc. °- Physician Referral Service- 1-800-533-3463 ° °Chronic Pain  Problems: °Organization         Address  Phone   Notes  °[facility]  (336) 297-2271 Patients need to be referred by their primary care doctor.  ° °Medication Assistance: °Organization         Address  Phone   Notes  °[place] [facility] [address] °[place], [place] [address] (336) 641-8030 --Must be a resident of [place] °-- Must have NO insurance coverage whatsoever (no Medicaid/ Medicare, etc.) °-- The pt. MUST have a primary care doctor that directs their care regularly and follows them in the community °  °[facility]  (866) 331-1348   °[employer]  (888) 892-1162   ° °Agencies that provide inexpensive medical care: °Organization         Address  Phone   Notes  °[facility]  (336) 832-8035   °[facility]    (336) 832-7272   °[facility] [address] °[place], [place] [address] (336) 832-4777   °[facility] [address], °[place] (336) 271-4999   °  [facility]    (336) 373-0678   °[facility]    (336) 272-1050   °[facility] ° [address], [place] Phone:  (336) 832-4444, Fax:  (336) 832-4440 Hours of Operation:  9 am - 6 pm, M-F.  Also accepts Medicaid/Medicare and self-pay.  °[facility] ° [address], [place] Phone: (336) 832-3150, Fax: (336) 832-3151. Hours of Operation:  8:30 am - 5:30 pm, M-F.  Also accepts Medicaid and self-pay.  °[facility] [place] [address], [place] Phone: (336) 878-6027   °[facility] [address], [place], [place] (336)723-1848, Ext. 123 Mondays & Thursdays: 7-9 AM.  First 15 patients are seen on a first come, first serve basis. °  ° °Medicaid-accepting [place] Providers: ° °Organization         Address  Phone   Notes  °[facility] [address], [place] (336) 641-2100 Also  accepts self-pay patients.  °[facility] [address], [place] ° (336) 856-9996   °[facility] [address], [place] (336) 288-8857   °[facility] [address] [place] [address], [place] (336) 299-7000   °Veita Bland [address], [place]  ° (336) 373-1557 Only accepts [place] Access Medicaid patients after they have their name applied to their card.  ° °Self-Pay (no insurance) in [place]: ° °Organization         Address  Phone   Notes  °Sickle Cell Patients, [facility] [address], [place] (336) 832-1970   °[facility] [address], [place] (336) 832-4400   °[facility] ° [address], [place] (336) 992-4800   °[facility]/Dr. Osei-Bonsu ° [address] [place] [address], [place] or [address], [place] (336) [phone] Phone number for both [place] and [place] locations is the same.  °[facility] [address], [place] (336) 299-0000   °[facility] [address] [place] [address], [place] or [address] (336) 852-7530 °(336) 878-2260   °[facility] [address], [place] (336) 350-1642, phone; (336) 294-5005, fax Sees patients 1st and 3rd Saturday of every month.  Must not qualify for public or private insurance (i.e. Medicaid, Medicare, [place] Health Choice, Veterans' Benefits) • Household income should be no more than 200% of the poverty level •The clinic cannot treat you if you are pregnant or think you are pregnant • Sexually transmitted diseases are not treated at the clinic.  ° ° °Dental Care: °Organization         Address  Phone  Notes  °[facility] [address],  (336) 641-6152 Accepts children up to age 21 who are enrolled in Medicaid or [place] Health Choice; pregnant  women with a Medicaid card; and children who have applied for Medicaid or [place] Health Choice, but were declined, whose parents can pay a reduced fee at time of service.  °[facility] [place]  [address], [place] (336) 641-7733 Accepts children up to age 21 who are enrolled in Medicaid or [place] Health Choice; pregnant women with a Medicaid card; and children who have applied for Medicaid or [place] Health Choice, but were declined,   whose parents can pay a reduced fee at time of service.  °[facility] ° [address], [place] (336) 641-4533 Patients are seen by appointment only. Walk-ins are not accepted. [facility] will see patients 18 years of age and older. °Monday - Tuesday (8am-5pm) °Most Wednesdays (8:30-5pm) °$30 per visit, cash only  °[facility] ° [address], [place] (336) 641-4533 Patients are seen by appointment only. Walk-ins are not accepted. [facility] will see patients 18 years of age and older. °One Wednesday Evening (Monthly: Volunteer Based).  $30 per visit, cash only  °[facility]  (919) 537-3737 for adults; Children under age 4, call Graduate Pediatric Dentistry at (919) [phone]. Children aged 4-14, please call (919) [phone] to request a pediatric application. ° Dental services are provided in all areas of dental care including fillings, crowns and bridges, complete and partial dentures, implants, gum treatment, root canals, and extractions. Preventive care is also provided. Treatment is provided to both adults and children. °Patients are selected via a lottery and there is often a waiting list. °  °[facility] [address], °[place] ° (336) 763-8833 [email or web address] °  °[facility] [address], [place], [place] (336)723-1848, Ext. 123 Second and Fourth Thursday of each month, opens at 6:30 AM; Clinic ends at 9 AM.  Patients are  seen on a first-come first-served basis, and a limited number are seen during each clinic.  ° °[facility] ° [address], [place], [place] (336) 723-7904   Eligibility Requirements °You must have lived in [place], [place], or [place] counties for at least the last three months. °  You cannot be eligible for state or federal sponsored healthcare insurance, including Veterans Administration, Medicaid, or Medicare. °  You generally cannot be eligible for healthcare insurance through your employer.  °  How to apply: °Eligibility screenings are held every Tuesday and Wednesday afternoon from 1:00 pm until 4:00 pm. You do not need an appointment for the interview!  °[facility] [address], [place], [place] 336-631-2330   °[facility]  336-342-8273   °[facility]  336-703-3100   °[facility]  336-570-6415   ° °Behavioral Health Resources in the Community: °Intensive Outpatient Programs °Organization         Address  Phone  Notes  °[facility] [address], [place], [place] 336-878-6098   °[facility] [address], [place], [place] 336-832-9800   °[facility] [address], [place], [place] ° 336-882-2125   °[facility] [address],  °[place], [place] 1-800-853-5163 or 336-641-4981   °Substance Abuse Resources °Organization         Address  Phone  Notes  °[facility]  336-882-2125   °[facility]  336-784-9470   °[facility]  336-285-9073   °[facility]  336-845-3988   °Residential & Outpatient Substance Abuse Program  1-800-659-3381   °Psychological Services °Organization         Address  Phone  Notes  °[facility]  336- 832-9600   °[facility]  336- 378-7881   °[facility] [address], [place] 1-800-853-5163 or 336-641-4981   ° °Mobile Crisis  Teams °Organization         Address  Phone  Notes  °[facility]   Crisis Care Unit  1-877-626-1772   °Assertive °[facility] ° [address] [place], [place] 336-834-9664   °Sharon DeEsch [address] °[place] 336-554-5454   ° °Self-Help/Support Groups °Organization         Address  Phone             Notes  °[facility] [place] - variety of support groups  336- 373-1402 Call for more information  °Narcotics Anonymous (NA), [facility] [address], °[place]  2 meetings at this location  ° °Residential Treatment Programs °Organization         Address  Phone  Notes  °[facility] [address],    °[place]  1-866-801-8205   °[facility] ° [address], [place], [place] 704-293-8524   °[facility] [address], [place] 336-845-3988 Admissions: 8am-3pm M-F  °[facility] [address]    °[place], [place] 336-841-1104   °[facility] [address], [place], [place] 336-379-7146   °[facility] [address]  °[place], [place] 336-285-9073   °[facility] - Intensive Outpatient [address], [place], [place] 336-852-3033   °[facility] [address]  °[place], [place] 1-877-615-2722 or 336-784-9470   °[facility] [address] [place], [place] 336-227-7417 Accepts Medicaid  °[facility] [address]  °[place] 1-800-659-3381 Substance Abuse/Addiction Treatment  ° °[place] Behavioral Health Resources °Organization         Address  Phone  Notes  °[facility]  (888) 581-9988   °Julie Brannon, PhD [address] [place], [place]   (336) 349-5553 or (336) 951-0000   °[facility]   [address] °[place], [place] (336) 349-4454   °[facility] [address], [place], [place] (336) 342-8316  Insurance/Medicaid/sponsorship through [facility]  °[facility] [address]                                    [place], [place] (336) 342-8316 Therapy/tele-psych/case  °[facility] [address]  ° [place], [place] (336) 349-2233    °Dr. Arfeen  (336) 349-4544   °[facility] [place]  [employer] [place] [facility] 1) [address], [place] °2) [address], [place] °3)  [address], [place] (336) 349-3220 °(336) 342-7768 ° °(336) 342-8140   °[place] [facility] (336) 342-1394 or (336) 342-3537 (After Hours)    ° ° ° °

## 2014-12-07 NOTE — ED Notes (Signed)
Patient c/o SOB, significant inspiratory and expiratory wheezing throughout lung fields.

## 2014-12-08 ENCOUNTER — Encounter (HOSPITAL_COMMUNITY): Payer: Self-pay | Admitting: Emergency Medicine

## 2014-12-08 ENCOUNTER — Emergency Department (HOSPITAL_COMMUNITY): Payer: Self-pay

## 2014-12-08 ENCOUNTER — Inpatient Hospital Stay (HOSPITAL_COMMUNITY)
Admission: EM | Admit: 2014-12-08 | Discharge: 2014-12-11 | DRG: 193 | Disposition: A | Discharge status: Home / Self Care | Payer: Self-pay | Attending: Internal Medicine | Admitting: Internal Medicine

## 2014-12-08 DIAGNOSIS — I10 Essential (primary) hypertension: Secondary | ICD-10-CM | POA: Diagnosis present

## 2014-12-08 DIAGNOSIS — I471 Supraventricular tachycardia: Secondary | ICD-10-CM

## 2014-12-08 DIAGNOSIS — J189 Pneumonia, unspecified organism: Principal | ICD-10-CM | POA: Diagnosis present

## 2014-12-08 DIAGNOSIS — Z833 Family history of diabetes mellitus: Secondary | ICD-10-CM

## 2014-12-08 DIAGNOSIS — Z79899 Other long term (current) drug therapy: Secondary | ICD-10-CM

## 2014-12-08 DIAGNOSIS — J9601 Acute respiratory failure with hypoxia: Secondary | ICD-10-CM | POA: Diagnosis present

## 2014-12-08 DIAGNOSIS — E785 Hyperlipidemia, unspecified: Secondary | ICD-10-CM | POA: Diagnosis present

## 2014-12-08 DIAGNOSIS — R000 Tachycardia, unspecified: Secondary | ICD-10-CM | POA: Insufficient documentation

## 2014-12-08 LAB — BASIC METABOLIC PANEL
ANION GAP: 10 (ref 5–15)
BUN: 15 mg/dL (ref 6–20)
CO2: 24 mmol/L (ref 22–32)
Calcium: 9.2 mg/dL (ref 8.9–10.3)
Chloride: 104 mmol/L (ref 101–111)
Creatinine, Ser: 1.11 mg/dL (ref 0.61–1.24)
GFR calc Af Amer: >60 mL/min (ref >60)
GLUCOSE: 136 mg/dL — AB (ref 70–99)
POTASSIUM: 3.8 mmol/L (ref 3.5–5.1)
SODIUM: 138 mmol/L (ref 135–145)

## 2014-12-08 LAB — CBC WITH DIFFERENTIAL/PLATELET
BASOS PCT: 1 % (ref 0–1)
Basophils Absolute: 0.1 10*3/uL (ref 0.0–0.1)
EOS ABS: 2 10*3/uL — AB (ref 0.0–0.7)
Eosinophils Relative: 16 % — ABNORMAL HIGH (ref 0–5)
HEMATOCRIT: 49.1 % (ref 39.0–52.0)
HEMOGLOBIN: 17.2 g/dL — AB (ref 13.0–17.0)
LYMPHS ABS: 2.9 10*3/uL (ref 0.7–4.0)
Lymphocytes Relative: 24 % (ref 12–46)
MCH: 29.4 pg (ref 26.0–34.0)
MCHC: 35 g/dL (ref 30.0–36.0)
MCV: 83.9 fL (ref 78.0–100.0)
MONO ABS: 0.6 10*3/uL (ref 0.1–1.0)
MONOS PCT: 5 % (ref 3–12)
NEUTROS PCT: 54 % (ref 43–77)
Neutro Abs: 6.4 10*3/uL (ref 1.7–7.7)
Platelets: 383 10*3/uL (ref 150–400)
RBC: 5.85 MIL/uL — ABNORMAL HIGH (ref 4.22–5.81)
RDW: 12 % (ref 11.5–15.5)
WBC: 12 10*3/uL — ABNORMAL HIGH (ref 4.0–10.5)

## 2014-12-08 LAB — I-STAT CG4 LACTIC ACID, ED: Lactic Acid, Venous: 1.12 mmol/L (ref 0.5–2.0)

## 2014-12-08 MED ORDER — DEXTROSE 5 % IV SOLN
500.0000 mg | Freq: Once | INTRAVENOUS | Status: AC
  Administered 2014-12-08: 500 mg via INTRAVENOUS
  Filled 2014-12-08: qty 500

## 2014-12-08 MED ORDER — SODIUM CHLORIDE 0.9 % IV BOLUS (SEPSIS)
1000.0000 mL | Freq: Once | INTRAVENOUS | Status: AC
  Administered 2014-12-08: 1000 mL via INTRAVENOUS

## 2014-12-08 MED ORDER — ALBUTEROL (5 MG/ML) CONTINUOUS INHALATION SOLN
15.0000 mg/h | INHALATION_SOLUTION | RESPIRATORY_TRACT | Status: DC
  Administered 2014-12-08: 15 mg/h via RESPIRATORY_TRACT
  Filled 2014-12-08: qty 20

## 2014-12-08 MED ORDER — AZITHROMYCIN 500 MG PO TABS
500.0000 mg | ORAL_TABLET | ORAL | Status: DC
  Administered 2014-12-09 – 2014-12-10 (×2): 500 mg via ORAL
  Filled 2014-12-08 (×4): qty 1

## 2014-12-08 MED ORDER — DEXTROSE 5 % IV SOLN: 500.0000 mg | Freq: Once | INTRAVENOUS | Status: DC

## 2014-12-08 MED ORDER — DEXTROSE 5 % IV SOLN
1.0000 g | Freq: Once | INTRAVENOUS | Status: AC
  Administered 2014-12-08: 1 g via INTRAVENOUS
  Filled 2014-12-08: qty 10

## 2014-12-08 MED ORDER — ALBUTEROL SULFATE (2.5 MG/3ML) 0.083% IN NEBU
2.5000 mg | INHALATION_SOLUTION | Freq: Four times a day (QID) | RESPIRATORY_TRACT | Status: DC
  Administered 2014-12-09 (×2): 2.5 mg via RESPIRATORY_TRACT
  Filled 2014-12-08 (×2): qty 3

## 2014-12-08 MED ORDER — METHYLPREDNISOLONE SODIUM SUCC 125 MG IJ SOLR
125.0000 mg | Freq: Once | INTRAMUSCULAR | Status: AC
  Administered 2014-12-08: 125 mg via INTRAVENOUS
  Filled 2014-12-08: qty 2

## 2014-12-08 MED ORDER — IPRATROPIUM-ALBUTEROL 0.5-2.5 (3) MG/3ML IN SOLN
3.0000 mL | Freq: Once | RESPIRATORY_TRACT | Status: AC
  Administered 2014-12-08: 3 mL via RESPIRATORY_TRACT
  Filled 2014-12-08: qty 3

## 2014-12-08 MED ORDER — CEFTRIAXONE SODIUM IN DEXTROSE 20 MG/ML IV SOLN
1.0000 g | INTRAVENOUS | Status: DC
  Administered 2014-12-09 – 2014-12-10 (×2): 1 g via INTRAVENOUS
  Filled 2014-12-08 (×2): qty 50

## 2014-12-08 MED ORDER — ALBUTEROL SULFATE (2.5 MG/3ML) 0.083% IN NEBU
2.5000 mg | INHALATION_SOLUTION | RESPIRATORY_TRACT | Status: DC | PRN
  Administered 2014-12-09: 2.5 mg via RESPIRATORY_TRACT
  Filled 2014-12-08: qty 3

## 2014-12-08 MED ORDER — ENOXAPARIN SODIUM 40 MG/0.4ML ~~LOC~~ SOLN
40.0000 mg | SUBCUTANEOUS | Status: DC
  Administered 2014-12-09 – 2014-12-10 (×2): 40 mg via SUBCUTANEOUS
  Filled 2014-12-08 (×3): qty 0.4

## 2014-12-08 NOTE — ED Notes (Signed)
Pt from home c/o shortness of breath. Seen yesterday DX with Pneumonia and d/c with Z pack. Inspiratory and expiratory wheezes throughout.

## 2014-12-08 NOTE — H&P (Signed)
[facility] Admission History and Physical       Terry [name] BOF:[id number] DOB: [birth year] DOA: 12/08/2014  Referring physician: EDP PCP: Pcp Not In System  Specialists:   Chief Complaint: Fever Chills Cough  HPI: Terry [name] is a 44 y.o. male who resents to the ED with complaints of worsening SOB, Cough and Fevers, Chills x 4-5 days He began to have Wheezing over the past 24 hours.  He was seen in the ED 1 days ago and diagnosed with Pneumonia and placed on Azithromycin.  He took his first dose of Azithromycin in the AM.   He was evaluated in the ED this evening and his WBCs had increased from 10.8 to 12.0, and his chest X-ray findings have also worsened with RLL infiltrate and now a right apical infiltrate.    He was placed on IV Rocephin and Azithromycin for CAP pneumonia and also administered IV Solumedrol due to hs Wheezing.     Review of Systems:  Constitutional: No Weight Loss, No Weight Gain, Night Sweats, +Fevers, +Chills, Dizziness, Light Headedness, Fatigue, or Generalized Weakness HEENT: No Headaches, Difficulty Swallowing,Tooth/Dental Problems,Sore Throat,  No Sneezing, Rhinitis, Ear Ache, Nasal Congestion, or Post Nasal Drip,  Cardio-vascular:  No Chest pain, Orthopnea, PND, Edema in Lower Extremities, Anasarca, Dizziness, Palpitations  Resp: No Dyspnea, No DOE, No Productive Cough, +Non-Productive Cough, No Hemoptysis, No Wheezing.    GI: No Heartburn, Indigestion, Abdominal Pain, Nausea, Vomiting, Diarrhea, Constipation, Hematemesis, Hematochezia, Melena, Change in Bowel Habits,  Loss of Appetite  GU: No Dysuria, No Change in Color of Urine, No Urgency or Urinary Frequency, No Flank pain.  Musculoskeletal: No Joint Pain or Swelling, No Decreased Range of Motion, No Back Pain.  Neurologic: No Syncope, No Seizures, Muscle Weakness, Paresthesia, Vision Disturbance or Loss, No Diplopia, No Vertigo, No Difficulty Walking,  Skin: No Rash or Lesions. Psych: No Change in  Mood or Affect, No Depression or Anxiety, No Memory loss, No Confusion, or Hallucinations   Past Medical History  Diagnosis Date  . Hyperlipidemia   . Hypertension      Past Surgical History  Procedure Laterality Date  . Triceps tendon repair Left 04/2013      Prior to Admission medications   Medication Sig Start Date End Date Taking? Authorizing Provider  azithromycin (ZITHROMAX) 250 MG tablet Take 1 tablet (250 mg total) by mouth daily. Take first 2 tablets together, then 1 every day until finished. 12/07/14  Yes Hanna Patel-Mills, PA-C  Phenylephrine-DM-GG 2.5-5-100 MG/5ML LIQD Take 30 mLs by mouth every 4 (four) hours as needed (for cold and cough).   Yes Historical Provider, MD  amLODipine (NORVASC) 10 MG tablet Take 1 tablet (10 mg total) by mouth daily. Patient not taking: Reported on 12/07/2014 10/18/13   Roselee Culver, MD  pravastatin (PRAVACHOL) 40 MG tablet Take 1 tablet (40 mg total) by mouth daily. Patient not taking: Reported on 12/07/2014 10/18/13   Roselee Culver, MD  valACYclovir (VALTREX) 1000 MG tablet Take as instructed. Patient not taking: Reported on 12/07/2014 10/18/13   Roselee Culver, MD  valACYclovir (VALTREX) 1000 MG tablet Take 1 tablet (1,000 mg total) by mouth 3 (three) times daily. Patient not taking: Reported on 12/07/2014 10/18/13   Roselee Culver, MD     No Known Allergies  Social History:  reports that he has never smoked. He does not have any smokeless tobacco history on file. He reports that he drinks alcohol. He reports that he does not use illicit  drugs.    Family History  Problem Relation Age of Onset  . Diabetes Mother   . Diabetes Father        Physical Exam:  GEN:  Pleasant Well Nourished and Well Developed   44 y.o. Hispanic  male examined and in no acute distress; cooperative with exam Filed Vitals:   12/08/14 2200 12/08/14 2217 12/08/14 2230 12/08/14 2252  BP: 150/86 123/86 134/74 134/74  Pulse: 120 74 41 57  Temp:        TempSrc:      Resp: 17 19 25 18   SpO2: 98% 94% 93% 94%   Blood pressure 134/74, pulse 57, temperature 98.1 F (36.7 C), temperature source Oral, resp. rate 18, SpO2 94 %. PSYCH: He is alert and oriented x4; does not appear anxious does not appear depressed; affect is normal HEENT: Normocephalic and Atraumatic, Mucous membranes pink; PERRLA; EOM intact; Fundi:  Benign;  No scleral icterus, Nares: Patent, Oropharynx: Clear, Fair Dentition,    Neck:  FROM, No Cervical Lymphadenopathy nor Thyromegaly or Carotid Bruit; No JVD; Breasts:: Not examined CHEST WALL: No tenderness CHEST: diffuse Expiratory Wheezes,   Decreased Breath Sounds  No Rhonchi, or Rales,    HEART: Regular rate and rhythm; no murmurs rubs or gallops BACK: No kyphosis or scoliosis; No CVA tenderness ABDOMEN: Positive Bowel Sounds, Soft Non-Tender, No Rebound or Guarding; No Masses, No Organomegaly Rectal Exam: Not done EXTREMITIES: No Cyanosis, Clubbing, or Edema; No Ulcerations. Genitalia: not examined PULSES: 2+ and symmetric SKIN: Normal hydration no rash or ulceration CNS:  Alert and Oriented x 4, No Focal Deficits Vascular: pulses palpable throughout    Labs on Admission:  Basic Metabolic Panel:  Recent Labs Lab 12/07/14 1723 12/08/14 2046  NA 138 138  K 3.8 3.8  CL 107 104  CO2 26 24  GLUCOSE 148* 136*  BUN 18 15  CREATININE 1.14 1.11  CALCIUM 9.0 9.2   Liver Function Tests: No results for input(s): AST, ALT, ALKPHOS, BILITOT, PROT, ALBUMIN in the last 168 hours. No results for input(s): LIPASE, AMYLASE in the last 168 hours. No results for input(s): AMMONIA in the last 168 hours. CBC:  Recent Labs Lab 12/07/14 1723 12/08/14 2046  WBC 10.8* 12.0*  NEUTROABS 6.1 6.4  HGB 17.0 17.2*  HCT 48.6 49.1  MCV 84.1 83.9  PLT 335 383   Cardiac Enzymes: No results for input(s): CKTOTAL, CKMB, CKMBINDEX, TROPONINI in the last 168 hours.  BNP (last 3 results) No results for input(s): BNP in the last  8760 hours.  ProBNP (last 3 results) No results for input(s): PROBNP in the last 8760 hours.  CBG: No results for input(s): GLUCAP in the last 168 hours.  Radiological Exams on Admission: Dg Chest 2 View  12/07/2014   CLINICAL DATA:  Shortness of breath for 2 days  EXAM: CHEST  2 VIEW  COMPARISON:  None.  FINDINGS: Cardiac shadow is within normal limits. The lungs are well aerated bilaterally. Some fullness is noted in the right infrahilar region which may represent some early infiltrate. Short-term followup following appropriate therapy is recommended. No other focal abnormality is seen. No bony abnormality is noted.  IMPRESSION: Increased density in the right infrahilar region likely representing an early infiltrate. Followup PA and lateral chest X-ray is recommended in 3-4 weeks following trial of antibiotic therapy to ensure resolution and exclude underlying malignancy.   Electronically Signed   By: Inez Catalina M.D.   On: 12/07/2014 17:47   Dg Chest [facility]  1 View  12/08/2014   CLINICAL DATA:  Shortness of Breath  EXAM: PORTABLE CHEST - 1 VIEW  COMPARISON:  12/07/2014  FINDINGS: Cardiac shadow is stable. The lungs are well aerated bilaterally. Persistent right infrahilar density is. There is also some increased density in the medial aspect of the right lung apex. A portion of this is related to extrinsic artifact although mild infiltrative density is seen. No bony abnormality is noted.  IMPRESSION: Persistent right basilar changes with new right apical changes.   Electronically Signed   By: Inez Catalina M.D.   On: 12/08/2014 21:27     EKG: Independently reviewed.    Assessment/Plan:   44 y.o. male with  Principal Problem:   1.   Community acquired pneumonia   IV Rocephin and Azithromycin   Albuterol Nebs   IVFs   Active Problems:   2.   Sinus Tachycardia      3.    DVT Prophylaxis   Lovenox          Code Status:     FULL CODE       Family Communication:    Family Present      Disposition Plan:    Inpatient  Status        Time spent:  [facility] [place] Pager [phone]   If [place] Please Contact the Day Rounding Team MD for [facility]  If 7PM-7AM, Please Contact Night-Floor Coverage  [email or web address] Password [email or web address] 12/08/2014, 11:09 PM     ADDENDUM:   Patient was seen and examined on 12/08/2014

## 2014-12-08 NOTE — ED Provider Notes (Signed)
CSN: [id number]     Arrival date & time 12/08/14  2008 History   First MD Initiated Contact with Patient 12/08/14 2026     Chief Complaint  Patient presents with  . Shortness of Breath     (Consider location/radiation/quality/duration/timing/severity/associated sxs/prior Treatment) HPI 44 year old male presents with shortness of breath. This is been ongoing for the past 2-3 days. The patient was seen here yesterday, diagnosed with pneumonia, and discharged with azithromycin. He has taken the 500 mg but no other medicines. Patient has been having cough, wheezing, and shortness of breath over this last couple days. No fevers. Patient feels like his gasping for air. Was given a DuoNeb prior to my arrival and states he feels much better. Has chest pain when coughing but otherwise no chest pain. No known history of asthma or COPD. No prior smoking history. Initial oxygen saturation 90% on room air.  Past Medical History  Diagnosis Date  . Hyperlipidemia   . Hypertension    Past Surgical History  Procedure Laterality Date  . Triceps tendon repair Left 04/2013   Family History  Problem Relation Age of Onset  . Diabetes Mother   . Diabetes Father    History  Substance Use Topics  . Smoking status: Never Smoker   . Smokeless tobacco: Not on file  . Alcohol Use: Yes    Review of Systems  Constitutional: Negative for fever.  Respiratory: Positive for cough, shortness of breath and wheezing.   Cardiovascular: Positive for chest pain.  Gastrointestinal: Negative for vomiting.  All other systems reviewed and are negative.     Allergies  Review of patient's allergies indicates no known allergies.  Home Medications   Prior to Admission medications   Medication Sig Start Date End Date Taking? Authorizing Provider  azithromycin (ZITHROMAX) 250 MG tablet Take 1 tablet (250 mg total) by mouth daily. Take first 2 tablets together, then 1 every day until finished. 12/07/14  Yes Hanna  Patel-Mills, PA-C  Phenylephrine-DM-GG 2.5-5-100 MG/5ML LIQD Take 30 mLs by mouth every 4 (four) hours as needed (for cold and cough).   Yes Historical Provider, MD  amLODipine (NORVASC) 10 MG tablet Take 1 tablet (10 mg total) by mouth daily. Patient not taking: Reported on 12/07/2014 10/18/13   Roselee Culver, MD  pravastatin (PRAVACHOL) 40 MG tablet Take 1 tablet (40 mg total) by mouth daily. Patient not taking: Reported on 12/07/2014 10/18/13   Roselee Culver, MD  valACYclovir (VALTREX) 1000 MG tablet Take as instructed. Patient not taking: Reported on 12/07/2014 10/18/13   Roselee Culver, MD  valACYclovir (VALTREX) 1000 MG tablet Take 1 tablet (1,000 mg total) by mouth 3 (three) times daily. Patient not taking: Reported on 12/07/2014 10/18/13   Roselee Culver, MD   BP 160/110 mmHg  Pulse 98  Temp(Src) 98.1 F (36.7 C) (Oral)  Resp 24  SpO2 91% Physical Exam  Constitutional: He is oriented to person, place, and time. He appears well-developed and well-nourished. No distress.  HENT:  Head: Normocephalic and atraumatic.  Right Ear: External ear normal.  Left Ear: External ear normal.  Nose: Nose normal.  Eyes: Right eye exhibits no discharge. Left eye exhibits no discharge.  Neck: Neck supple.  Cardiovascular: Normal rate, regular rhythm, normal heart sounds and intact distal pulses.   Pulmonary/Chest: Effort normal. He has wheezes (diffuse expiratory wheezes).  Abdominal: Soft. He exhibits no distension. There is no tenderness.  Musculoskeletal: He exhibits no edema.  Neurological: He is alert and oriented  to person, place, and time.  Skin: Skin is warm and dry. He is not diaphoretic.  Nursing note and vitals reviewed.   ED Course  Procedures (including critical care time) Labs Review Labs Reviewed  BASIC METABOLIC PANEL - Abnormal; Notable for the following:    Glucose, Bld 136 (*)    All other components within normal limits  CBC WITH DIFFERENTIAL/PLATELET -  Abnormal; Notable for the following:    WBC 12.0 (*)    RBC 5.85 (*)    Hemoglobin 17.2 (*)    Eosinophils Relative 16 (*)    Eosinophils Absolute 2.0 (*)    All other components within normal limits  CULTURE, BLOOD (ROUTINE X 2)  CULTURE, BLOOD (ROUTINE X 2)  CULTURE, BLOOD (ROUTINE X 2)  CULTURE, BLOOD (ROUTINE X 2)  CULTURE, EXPECTORATED SPUTUM-ASSESSMENT  GRAM STAIN  HIV ANTIBODY (ROUTINE TESTING)  LEGIONELLA ANTIGEN, URINE  STREP PNEUMONIAE URINARY ANTIGEN  I-STAT CG4 LACTIC ACID, ED    Imaging Review Dg Chest 2 View  12/07/2014   CLINICAL DATA:  Shortness of breath for 2 days  EXAM: CHEST  2 VIEW  COMPARISON:  None.  FINDINGS: Cardiac shadow is within normal limits. The lungs are well aerated bilaterally. Some fullness is noted in the right infrahilar region which may represent some early infiltrate. Short-term followup following appropriate therapy is recommended. No other focal abnormality is seen. No bony abnormality is noted.  IMPRESSION: Increased density in the right infrahilar region likely representing an early infiltrate. Followup PA and lateral chest X-ray is recommended in 3-4 weeks following trial of antibiotic therapy to ensure resolution and exclude underlying malignancy.   Electronically Signed   By: Inez Catalina M.D.   On: 12/07/2014 17:47   Dg Chest Port 1 View  12/08/2014   CLINICAL DATA:  Shortness of Breath  EXAM: PORTABLE CHEST - 1 VIEW  COMPARISON:  12/07/2014  FINDINGS: Cardiac shadow is stable. The lungs are well aerated bilaterally. Persistent right infrahilar density is. There is also some increased density in the medial aspect of the right lung apex. A portion of this is related to extrinsic artifact although mild infiltrative density is seen. No bony abnormality is noted.  IMPRESSION: Persistent right basilar changes with new right apical changes.   Electronically Signed   By: Inez Catalina M.D.   On: 12/08/2014 21:27     EKG Interpretation   Date/Time:   Sunday Dec 08 2014 20:19:45 EDT Ventricular Rate:  93 PR Interval:  126 QRS Duration: 79 QT Interval:  343 QTC Calculation: 427 R Axis:   91 Text Interpretation:  Sinus tachycardia Atrial premature complexes Right  atrial enlargement Borderline right axis deviation No old tracing to  compare Confirmed by [place]  MD, Cera Rorke ([id number]) on 12/08/2014 8:27:24 PM      MDM   Final diagnoses:  Community acquired pneumonia    Patient has significant bronchospasm and increased work of breathing. Given a continuous albuterol nebulizer with improvement symptomatically but still having expiratory wheezing. Oxygen saturations are hovering between 90 and 93%. Still has some mild increased work of breathing. X-ray appears slightly worse as well blood cell count is up a little bit. At this point I believe patient would benefit from IV antibiotics and respiratory support in the hospital. He will be admitted to the hospitalist.    Sherwood Gambler, MD 12/09/14 0010

## 2014-12-09 ENCOUNTER — Encounter (HOSPITAL_COMMUNITY): Payer: Self-pay | Admitting: *Deleted

## 2014-12-09 DIAGNOSIS — R000 Tachycardia, unspecified: Secondary | ICD-10-CM | POA: Insufficient documentation

## 2014-12-09 LAB — HIV ANTIBODY (ROUTINE TESTING W REFLEX): HIV Screen 4th Generation wRfx: NONREACTIVE

## 2014-12-09 MED ORDER — ALBUTEROL SULFATE (2.5 MG/3ML) 0.083% IN NEBU
2.5000 mg | INHALATION_SOLUTION | Freq: Three times a day (TID) | RESPIRATORY_TRACT | Status: DC
  Administered 2014-12-09 – 2014-12-11 (×6): 2.5 mg via RESPIRATORY_TRACT
  Filled 2014-12-09 (×7): qty 3

## 2014-12-09 MED ORDER — GI COCKTAIL ~~LOC~~
30.0000 mL | Freq: Once | ORAL | Status: AC
  Administered 2014-12-09: 30 mL via ORAL
  Filled 2014-12-09: qty 30

## 2014-12-09 NOTE — Progress Notes (Signed)
Patient Demographics  Terry [name], is a 44 y.o. male, DOB - 03/20/1971, UKG:[id number]  Admit date - 12/08/2014   Admitting Physician Theressa Millard, MD  Outpatient Primary MD for the patient is Pcp Not In System  LOS - 1   Chief Complaint  Patient presents with  . Shortness of Breath        Subjective:   Terry [name] today has, No headache, No chest pain, No abdominal pain - No Nausea, No new weakness tingling or numbness, improved but positive Cough and SOB.    Assessment & Plan    1.CAP with acute hypoxic respiratory failure - continue empiric IV antibiotics, continue supportive care with oxygen nebulizer treatments. Pending HIV, strep pneumo, Legionella, blood and sputum cultures will follow.   2. Tachycardia. Due to #1 above. Supportive care. Lactic acid normal, nontoxic appearing. Do not think this was sepsis.    Code Status: Full  Family Communication: Wife bedside  Disposition Plan: Home   Procedures None   Consults   None   Medications  Scheduled Meds: . albuterol  2.5 mg Nebulization Q6H  . azithromycin  500 mg Oral Q24H  . cefTRIAXone (ROCEPHIN)  IV  1 g Intravenous Q24H  . enoxaparin (LOVENOX) injection  40 mg Subcutaneous Q24H   Continuous Infusions: . albuterol Stopped (12/08/14 2308)   PRN Meds:.albuterol  DVT Prophylaxis  Lovenox    Lab Results  Component Value Date   PLT 383 12/08/2014    Antibiotics     Anti-infectives    Start     Dose/Rate Route Frequency Ordered Stop   12/09/14 2100  azithromycin (ZITHROMAX) tablet 500 mg     500 mg Oral Every 24 hours 12/08/14 2359 12/16/14 2059   12/09/14 2000  cefTRIAXone (ROCEPHIN) 1 g in dextrose 5 % 50 mL IVPB - Premix     1 g 100 mL/hr over 30 Minutes Intravenous Every 24 hours 12/08/14 2359  12/16/14 1959   12/08/14 2245  azithromycin (ZITHROMAX) 500 mg in dextrose 5 % 250 mL IVPB     500 mg 250 mL/hr over 60 Minutes Intravenous  Once 12/08/14 2233 12/09/14 0030   12/08/14 2230  cefTRIAXone (ROCEPHIN) 1 g in dextrose 5 % 50 mL IVPB     1 g 100 mL/hr over 30 Minutes Intravenous  Once 12/08/14 2217 12/09/14 0030   12/08/14 2230  azithromycin (ZITHROMAX) 500 mg in dextrose 5 % 250 mL IVPB  Status:  Discontinued     500 mg 250 mL/hr over 60 Minutes Intravenous  Once 12/08/14 2217 12/08/14 2217          Objective:   Filed Vitals:   12/09/14 0153 12/09/14 0452 12/09/14 0743 12/09/14 0913  BP:  126/81  157/84  Pulse:  117  119  Temp:  97.9 F (36.6 C)  98 F (36.7 C)  TempSrc:  Oral  Oral  Resp:  19  20  Height:      Weight:      SpO2: 94% 95% 92% 93%    Wt Readings from Last 3 Encounters:  12/08/14 77.837 kg (171 lb 9.6 oz)  10/18/13 84.278 kg (185 lb 12.8 oz)  01/16/13 86.183 kg (190 lb)     Intake/Output Summary (  Last 24 hours) at 12/09/14 1005 Last data filed at 12/09/14 0900  Gross per 24 hour  Intake    780 ml  Output      0 ml  Net    780 ml     Physical Exam  Awake Alert, Oriented X 3, No new F.N deficits, Normal affect [place].AT,PERRAL Supple Neck,No JVD, No cervical lymphadenopathy appriciated.  Symmetrical Chest wall movement, Good air movement bilaterally, coarse bilateral breath sounds RRR,No Gallops,Rubs or new Murmurs, No Parasternal Heave +ve B.Sounds, Abd Soft, No tenderness, No organomegaly appriciated, No rebound - guarding or rigidity. No Cyanosis, Clubbing or edema, No new Rash or bruise      Data Review   Micro Results No results found for this or any previous visit (from the past 240 hour(s)).  Radiology Reports Dg Chest 2 View  12/07/2014   CLINICAL DATA:  Shortness of breath for 2 days  EXAM: CHEST  2 VIEW  COMPARISON:  None.  FINDINGS: Cardiac shadow is within normal limits. The lungs are well aerated bilaterally. Some  fullness is noted in the right infrahilar region which may represent some early infiltrate. Short-term followup following appropriate therapy is recommended. No other focal abnormality is seen. No bony abnormality is noted.  IMPRESSION: Increased density in the right infrahilar region likely representing an early infiltrate. Followup PA and lateral chest X-ray is recommended in 3-4 weeks following trial of antibiotic therapy to ensure resolution and exclude underlying malignancy.   Electronically Signed   By: Inez Catalina M.D.   On: 12/07/2014 17:47   Dg Chest Port 1 View  12/08/2014   CLINICAL DATA:  Shortness of Breath  EXAM: PORTABLE CHEST - 1 VIEW  COMPARISON:  12/07/2014  FINDINGS: Cardiac shadow is stable. The lungs are well aerated bilaterally. Persistent right infrahilar density is. There is also some increased density in the medial aspect of the right lung apex. A portion of this is related to extrinsic artifact although mild infiltrative density is seen. No bony abnormality is noted.  IMPRESSION: Persistent right basilar changes with new right apical changes.   Electronically Signed   By: Inez Catalina M.D.   On: 12/08/2014 21:27     CBC  Recent Labs Lab 12/07/14 1723 12/08/14 2046  WBC 10.8* 12.0*  HGB 17.0 17.2*  HCT 48.6 49.1  PLT 335 383  MCV 84.1 83.9  MCH 29.4 29.4  MCHC 35.0 35.0  RDW 12.1 12.0  LYMPHSABS 2.2 2.9  MONOABS 0.7 0.6  EOSABS 1.6* 2.0*  BASOSABS 0.1 0.1    Chemistries   Recent Labs Lab 12/07/14 1723 12/08/14 2046  NA 138 138  K 3.8 3.8  CL 107 104  CO2 26 24  GLUCOSE 148* 136*  BUN 18 15  CREATININE 1.14 1.11  CALCIUM 9.0 9.2   ------------------------------------------------------------------------------------------------------------------ estimated creatinine clearance is 84.7 mL/min (by C-G formula based on Cr of 1.11). ------------------------------------------------------------------------------------------------------------------ No results  for input(s): HGBA1C in the last 72 hours. ------------------------------------------------------------------------------------------------------------------ No results for input(s): CHOL, HDL, LDLCALC, TRIG, CHOLHDL, LDLDIRECT in the last 72 hours. ------------------------------------------------------------------------------------------------------------------ No results for input(s): TSH, T4TOTAL, T3FREE, THYROIDAB in the last 72 hours.  Invalid input(s): FREET3 ------------------------------------------------------------------------------------------------------------------ No results for input(s): VITAMINB12, FOLATE, FERRITIN, TIBC, IRON, RETICCTPCT in the last 72 hours.  Coagulation profile No results for input(s): INR, PROTIME in the last 168 hours.  No results for input(s): DDIMER in the last 72 hours.  Cardiac Enzymes No results for input(s): CKMB, TROPONINI, MYOGLOBIN in the last 168 hours.  Invalid input(s): CK ------------------------------------------------------------------------------------------------------------------ Invalid input(s): POCBNP     Time Spent in minutes 35   SINGH,PRASHANT K M.D on 12/09/2014 at 10:05 AM  Between 7am to 7pm - Pager - [phone]  After 7pm go to [email or web address] - password [email or web address]  [facility]   Office  [phone]

## 2014-12-10 ENCOUNTER — Inpatient Hospital Stay (HOSPITAL_COMMUNITY): Payer: Self-pay

## 2014-12-10 LAB — URINALYSIS, ROUTINE W REFLEX MICROSCOPIC
Bilirubin Urine: NEGATIVE
Glucose, UA: NEGATIVE mg/dL
Hgb urine dipstick: NEGATIVE
KETONES UR: NEGATIVE mg/dL
Leukocytes, UA: NEGATIVE
Nitrite: NEGATIVE
Protein, ur: NEGATIVE mg/dL
Specific Gravity, Urine: 1.021 (ref 1.005–1.030)
Urobilinogen, UA: 0.2 mg/dL (ref 0.0–1.0)
pH: 6.5 (ref 5.0–8.0)

## 2014-12-10 LAB — STREP PNEUMONIAE URINARY ANTIGEN: STREP PNEUMO URINARY ANTIGEN: NEGATIVE

## 2014-12-10 MED ORDER — METHYLPREDNISOLONE SODIUM SUCC 125 MG IJ SOLR
60.0000 mg | Freq: Every day | INTRAMUSCULAR | Status: DC
  Administered 2014-12-10 – 2014-12-11 (×2): 60 mg via INTRAVENOUS
  Filled 2014-12-10 (×2): qty 2

## 2014-12-10 NOTE — Progress Notes (Signed)
Patient Demographics  Caulder Wehner, is a 44 y.o. male, DOB - 02-28-1971, UDJ:[id number]  Admit date - 12/08/2014   Admitting Physician Theressa Millard, MD  Outpatient Primary MD for the patient is Pcp Not In System  LOS - 2   Chief Complaint  Patient presents with  . Shortness of Breath        Subjective:   Samson Ralph today has, No headache, No chest pain, No abdominal pain - No Nausea, No new weakness tingling or numbness, much improved Cough and SOB.    Assessment & Plan    1.CAP with acute hypoxic respiratory failure - has clinically improved and feels better, continue empiric IV antibiotics, continue supportive care with oxygen nebulizer treatments. Does have some wheezing today, question some reactive airway disease due to seasonal allergies may be undiagnosed asthma, trial of solid Medrol one dose, add flutter valve for pulmonary hygiene and monitor. Negative HIV, pending strep pneumo, Legionella, blood and sputum cultures will follow.   2. Tachycardia. Due to #1 above. Resolved with Supportive care. Lactic acid normal, nontoxic appearing. Do not think this was sepsis.    Code Status: Full  Family Communication: Wife bedside  Disposition Plan: Home 12/11/2014 if stable   Procedures None   Consults   None   Medications  Scheduled Meds: . albuterol  2.5 mg Nebulization TID  . azithromycin  500 mg Oral Q24H  . cefTRIAXone (ROCEPHIN)  IV  1 g Intravenous Q24H  . enoxaparin (LOVENOX) injection  40 mg Subcutaneous Q24H  . methylPREDNISolone (SOLU-MEDROL) injection  60 mg Intravenous Daily   Continuous Infusions: . albuterol Stopped (12/08/14 2308)   PRN Meds:.albuterol  DVT Prophylaxis  Lovenox    Lab Results  Component Value Date   PLT 383 12/08/2014     Antibiotics     Anti-infectives    Start     Dose/Rate Route Frequency Ordered Stop   12/09/14 2100  azithromycin (ZITHROMAX) tablet 500 mg     500 mg Oral Every 24 hours 12/08/14 2359 12/16/14 2059   12/09/14 2000  cefTRIAXone (ROCEPHIN) 1 g in dextrose 5 % 50 mL IVPB - Premix     1 g 100 mL/hr over 30 Minutes Intravenous Every 24 hours 12/08/14 2359 12/16/14 1959   12/08/14 2245  azithromycin (ZITHROMAX) 500 mg in dextrose 5 % 250 mL IVPB     500 mg 250 mL/hr over 60 Minutes Intravenous  Once 12/08/14 2233 12/09/14 0030   12/08/14 2230  cefTRIAXone (ROCEPHIN) 1 g in dextrose 5 % 50 mL IVPB     1 g 100 mL/hr over 30 Minutes Intravenous  Once 12/08/14 2217 12/09/14 0030   12/08/14 2230  azithromycin (ZITHROMAX) 500 mg in dextrose 5 % 250 mL IVPB  Status:  Discontinued     500 mg 250 mL/hr over 60 Minutes Intravenous  Once 12/08/14 2217 12/08/14 2217          Objective:   Filed Vitals:   12/09/14 2017 12/09/14 2306 12/09/14 2354 12/10/14 0538  BP:  123/71  109/71  Pulse:  100  73  Temp:  99.3 F (37.4 C)  97.9 F (36.6 C)  TempSrc:  Oral  Oral  Resp:  20  18  Height:  Weight:      SpO2: 95% 100% 99% 94%    Wt Readings from Last 3 Encounters:  12/08/14 77.837 kg (171 lb 9.6 oz)  10/18/13 84.278 kg (185 lb 12.8 oz)  01/16/13 86.183 kg (190 lb)     Intake/Output Summary (Last 24 hours) at 12/10/14 0929 Last data filed at 12/10/14 0900  Gross per 24 hour  Intake   1970 ml  Output    700 ml  Net   1270 ml     Physical Exam  Awake Alert, Oriented X 3, No new F.N deficits, Normal affect [place].AT,PERRAL Supple Neck,No JVD, No cervical lymphadenopathy appriciated.  Symmetrical Chest wall movement, Good air movement bilaterally, coarse bilateral breath sounds with some wheezing RRR,No Gallops,Rubs or new Murmurs, No Parasternal Heave +ve B.Sounds, Abd Soft, No tenderness, No organomegaly appriciated, No rebound - guarding or rigidity. No Cyanosis, Clubbing  or edema, No new Rash or bruise      Data Review   Micro Results No results found for this or any previous visit (from the past 240 hour(s)).  Radiology Reports Dg Chest 2 View  12/10/2014   CLINICAL DATA:  44 year old male with cough congestion and shortness of Breath. Community-acquired pneumonia. Initial encounter.  EXAM: CHEST  2 VIEW  COMPARISON:  12/08/2014 and earlier.  FINDINGS: Streaky bilateral perihilar opacity greater on the right re- identified. Mildly improved ventilation in the left lower lobe since 12/07/2014. No pleural effusion. No new areas of involvement. Lung volumes remain normal. Normal cardiac size and mediastinal contours. Visualized tracheal air column is within normal limits. No acute osseous abnormality identified.  IMPRESSION: Bilateral bronchopneumonia. No pleural effusion. Mildly improved ventilation in the left lower lobe since 12/07/2014.   Electronically Signed   By: Genevie Ann M.D.   On: 12/10/2014 08:58   Dg Chest 2 View  12/07/2014   CLINICAL DATA:  Shortness of breath for 2 days  EXAM: CHEST  2 VIEW  COMPARISON:  None.  FINDINGS: Cardiac shadow is within normal limits. The lungs are well aerated bilaterally. Some fullness is noted in the right infrahilar region which may represent some early infiltrate. Short-term followup following appropriate therapy is recommended. No other focal abnormality is seen. No bony abnormality is noted.  IMPRESSION: Increased density in the right infrahilar region likely representing an early infiltrate. Followup PA and lateral chest X-ray is recommended in 3-4 weeks following trial of antibiotic therapy to ensure resolution and exclude underlying malignancy.   Electronically Signed   By: Inez Catalina M.D.   On: 12/07/2014 17:47   Dg Chest Port 1 View  12/08/2014   CLINICAL DATA:  Shortness of Breath  EXAM: PORTABLE CHEST - 1 VIEW  COMPARISON:  12/07/2014  FINDINGS: Cardiac shadow is stable. The lungs are well aerated bilaterally.  Persistent right infrahilar density is. There is also some increased density in the medial aspect of the right lung apex. A portion of this is related to extrinsic artifact although mild infiltrative density is seen. No bony abnormality is noted.  IMPRESSION: Persistent right basilar changes with new right apical changes.   Electronically Signed   By: Inez Catalina M.D.   On: 12/08/2014 21:27     CBC  Recent Labs Lab 12/07/14 1723 12/08/14 2046  WBC 10.8* 12.0*  HGB 17.0 17.2*  HCT 48.6 49.1  PLT 335 383  MCV 84.1 83.9  MCH 29.4 29.4  MCHC 35.0 35.0  RDW 12.1 12.0  LYMPHSABS 2.2 2.9  MONOABS 0.7 0.6  EOSABS 1.6* 2.0*  BASOSABS 0.1 0.1    Chemistries   Recent Labs Lab 12/07/14 1723 12/08/14 2046  NA 138 138  K 3.8 3.8  CL 107 104  CO2 26 24  GLUCOSE 148* 136*  BUN 18 15  CREATININE 1.14 1.11  CALCIUM 9.0 9.2   ------------------------------------------------------------------------------------------------------------------ estimated creatinine clearance is 84.7 mL/min (by C-G formula based on Cr of 1.11). ------------------------------------------------------------------------------------------------------------------ No results for input(s): HGBA1C in the last 72 hours. ------------------------------------------------------------------------------------------------------------------ No results for input(s): CHOL, HDL, LDLCALC, TRIG, CHOLHDL, LDLDIRECT in the last 72 hours. ------------------------------------------------------------------------------------------------------------------ No results for input(s): TSH, T4TOTAL, T3FREE, THYROIDAB in the last 72 hours.  Invalid input(s): FREET3 ------------------------------------------------------------------------------------------------------------------ No results for input(s): VITAMINB12, FOLATE, FERRITIN, TIBC, IRON, RETICCTPCT in the last 72 hours.  Coagulation profile No results for input(s): INR, PROTIME in the  last 168 hours.  No results for input(s): DDIMER in the last 72 hours.  Cardiac Enzymes No results for input(s): CKMB, TROPONINI, MYOGLOBIN in the last 168 hours.  Invalid input(s): CK ------------------------------------------------------------------------------------------------------------------ Invalid input(s): POCBNP     Time Spent in minutes 35   Lavene Penagos K M.D on 12/10/2014 at 9:29 AM  Between 7am to 7pm - Pager - [phone]  After 7pm go to [email or web address] - password [facility]  [facility]   Office  [phone]

## 2014-12-11 LAB — URINE CULTURE
COLONY COUNT: NO GROWTH
CULTURE: NO GROWTH

## 2014-12-11 LAB — LEGIONELLA ANTIGEN, URINE

## 2014-12-11 MED ORDER — PREDNISONE 5 MG PO TABS: ORAL_TABLET | ORAL | Status: DC

## 2014-12-11 MED ORDER — LEVOFLOXACIN 750 MG PO TABS: 750.0000 mg | ORAL_TABLET | Freq: Every day | ORAL | Status: DC

## 2014-12-11 MED ORDER — ALBUTEROL SULFATE HFA 108 (90 BASE) MCG/ACT IN AERS: 2.0000 | INHALATION_SPRAY | Freq: Four times a day (QID) | RESPIRATORY_TRACT | Status: DC | PRN

## 2014-12-11 NOTE — Discharge Instructions (Signed)

## 2014-12-11 NOTE — Discharge Summary (Signed)
Terry [name], is a 44 y.o. male  DOB July 11, 1971  MRN [id number].  Admission date:  12/08/2014  Admitting Physician  Theressa Millard, MD  Discharge Date:  12/11/2014   Primary MD  Pcp Not In System  Recommendations for primary care physician for things to follow:   Needs repeat 2 view chest x-ray, CBC and BMP in a week   Admission Diagnosis  shortness of breath   Discharge Diagnosis  shortness of breath     Principal Problem:   Community acquired pneumonia Active Problems:   CAP (community acquired pneumonia)   Sinus tachycardia      Past Medical History  Diagnosis Date  . Hyperlipidemia   . Hypertension     Past Surgical History  Procedure Laterality Date  . Triceps tendon repair Left 04/2013       History of present illness and  Hospital Course:     Kindly see H&P for history of present illness and admission details, please review complete Labs, Consult reports and Test reports for all details in brief  HPI  from the history and physical done on the day of admission  Terry [name] is a 44 y.o. male who resents to the ED with complaints of worsening SOB, Cough and Fevers, Chills x 4-5 days He began to have Wheezing over the past 24 hours. He was seen in the ED 1 days ago and diagnosed with Pneumonia and placed on Azithromycin. He took his first dose of Azithromycin in the AM. He was evaluated in the ED this evening and his WBCs had increased from 10.8 to 12.0, and his chest X-ray findings have also worsened with RLL infiltrate and now a right apical infiltrate. He was placed on IV Rocephin and Azithromycin for CAP pneumonia and also administered IV Solumedrol due to hs Wheezing.   Hospital Course    1.CAP with acute hypoxic respiratory failure - has clinically improved and feels better,  he was treated with empiric IV antibiotics, IV fluids initially required oxygen and applies a treatments. Now completely symptom-free, oxygen free, afebrile. Negative HIV & strep pneumo,  blood and sputum cultures, pending legionella antigen. He will be discharged home on oral Levaquin, and C had some wheezing for the last 2 days he has been placed on on a steroid taper and given albuterol inhaler, have requested him to follow with PCP and pulmonary one time. Question undiagnosed reactive airway disease. Needs repeat chest x-ray to document clearing of his infiltrate.   2. Tachycardia. Due to #1 above. Resolved with Supportive care. Lactic acid normal, nontoxic appearing. Do not think this was sepsis.   Discharge Condition: Stable   Follow UP  Follow-up Information    Follow up with [facility]    . Schedule an appointment as soon as possible for a visit in 1 week.   Contact information:   [address] [place] [address] [phone]      Follow up with [facility], MD. Schedule an appointment as  soon as possible for a visit in 1 week.   Specialty:  Pulmonary Disease   Contact information:   [place] [address] [phone]         Discharge Instructions  and  Discharge Medications      Discharge Instructions    Diet - low sodium heart healthy    Complete by:  As directed      Discharge instructions    Complete by:  As directed   Follow with Primary MD  in 7 days   Get CBC, CMP, 2 view Chest X ray checked  by Primary MD next visit.    Activity: As tolerated with Full fall precautions use walker/cane & assistance as needed   Disposition Home    Diet: Heart Healthy   For Heart failure patients - Check your Weight same time everyday, if you gain over 2 pounds, or you develop in leg swelling, experience more shortness of breath or chest pain, call your Primary MD immediately. Follow Cardiac Low  Salt Diet and 1.5 lit/day fluid restriction.   On your next visit with your primary care physician please Get Medicines reviewed and adjusted.   Please request your Prim.MD to go over all Hospital Tests and Procedure/Radiological results at the follow up, please get all Hospital records sent to your Prim MD by signing hospital release before you go home.   If you experience worsening of your admission symptoms, develop shortness of breath, life threatening emergency, suicidal or homicidal thoughts you must seek medical attention immediately by calling 911 or calling your MD immediately  if symptoms less severe.  You Must read complete instructions/literature along with all the possible adverse reactions/side effects for all the Medicines you take and that have been prescribed to you. Take any new Medicines after you have completely understood and accpet all the possible adverse reactions/side effects.   Do not drive, operating heavy machinery, perform activities at heights, swimming or participation in water activities or provide baby sitting services if your were admitted for syncope or siezures until you have seen by Primary MD or a Neurologist and advised to do so again.  Do not drive when taking Pain medications.    Do not take more than prescribed Pain, Sleep and Anxiety Medications  Special Instructions: If you have smoked or chewed Tobacco  in the last 2 yrs please stop smoking, stop any regular Alcohol  and or any Recreational drug use.  Wear Seat belts while driving.   Please note  You were cared for by a hospitalist during your hospital stay. If you have any questions about your discharge medications or the care you received while you were in the hospital after you are discharged, you can call the unit and asked to speak with the hospitalist on call if the hospitalist that took care of you is not available. Once you are discharged, your primary care physician will handle any  further medical issues. Please note that NO REFILLS for any discharge medications will be authorized once you are discharged, as it is imperative that you return to your primary care physician (or establish a relationship with a primary care physician if you do not have one) for your aftercare needs so that they can reassess your need for medications and monitor your lab values.     Increase activity slowly    Complete by:  As directed             Medication List  STOP taking these medications        azithromycin 250 MG tablet  Commonly known as:  ZITHROMAX      TAKE these medications        albuterol 108 (90 BASE) MCG/ACT inhaler  Commonly known as:  PROVENTIL HFA;VENTOLIN HFA  Inhale 2 puffs into the lungs every 6 (six) hours as needed for wheezing or shortness of breath.     amLODipine 10 MG tablet  Commonly known as:  NORVASC  Take 1 tablet (10 mg total) by mouth daily.     levofloxacin 750 MG tablet  Commonly known as:  LEVAQUIN  Take 1 tablet (750 mg total) by mouth daily.     Phenylephrine-DM-GG 2.5-5-100 MG/5ML Liqd  Take 30 mLs by mouth every 4 (four) hours as needed (for cold and cough).     pravastatin 40 MG tablet  Commonly known as:  PRAVACHOL  Take 1 tablet (40 mg total) by mouth daily.     predniSONE 5 MG tablet  Commonly known as:  DELTASONE  Label  & dispense according to the schedule below. 6 Pills PO for 3 days, 4 Pills PO for 3 days, 2 Pills PO for 3 days, 1 Pills PO for 3 days, 1/2 Pill  PO for 3 days then STOP. Total 95 pills.     valACYclovir 1000 MG tablet  Commonly known as:  VALTREX  Take as instructed.     valACYclovir 1000 MG tablet  Commonly known as:  VALTREX  Take 1 tablet (1,000 mg total) by mouth 3 (three) times daily.          Diet and Activity recommendation: See Discharge Instructions above   Consults obtained - None   Major procedures and Radiology Reports - PLEASE review detailed and final reports for all details, in  brief -       Dg Chest 2 View  12/10/2014   CLINICAL DATA:  44 year old male with cough congestion and shortness of Breath. Community-acquired pneumonia. Initial encounter.  EXAM: CHEST  2 VIEW  COMPARISON:  12/08/2014 and earlier.  FINDINGS: Streaky bilateral perihilar opacity greater on the right re- identified. Mildly improved ventilation in the left lower lobe since 12/07/2014. No pleural effusion. No new areas of involvement. Lung volumes remain normal. Normal cardiac size and mediastinal contours. Visualized tracheal air column is within normal limits. No acute osseous abnormality identified.  IMPRESSION: Bilateral bronchopneumonia. No pleural effusion. Mildly improved ventilation in the left lower lobe since 12/07/2014.   Electronically Signed   By: Genevie Ann M.D.   On: 12/10/2014 08:58   Dg Chest 2 View  12/07/2014   CLINICAL DATA:  Shortness of breath for 2 days  EXAM: CHEST  2 VIEW  COMPARISON:  None.  FINDINGS: Cardiac shadow is within normal limits. The lungs are well aerated bilaterally. Some fullness is noted in the right infrahilar region which may represent some early infiltrate. Short-term followup following appropriate therapy is recommended. No other focal abnormality is seen. No bony abnormality is noted.  IMPRESSION: Increased density in the right infrahilar region likely representing an early infiltrate. Followup PA and lateral chest X-ray is recommended in 3-4 weeks following trial of antibiotic therapy to ensure resolution and exclude underlying malignancy.   Electronically Signed   By: Inez Catalina M.D.   On: 12/07/2014 17:47   Dg Chest Port 1 View  12/08/2014   CLINICAL DATA:  Shortness of Breath  EXAM: PORTABLE CHEST - 1 VIEW  COMPARISON:  12/07/2014  FINDINGS: Cardiac shadow is stable. The lungs are well aerated bilaterally. Persistent right infrahilar density is. There is also some increased density in the medial aspect of the right lung apex. A portion of this is related to  extrinsic artifact although mild infiltrative density is seen. No bony abnormality is noted.  IMPRESSION: Persistent right basilar changes with new right apical changes.   Electronically Signed   By: Inez Catalina M.D.   On: 12/08/2014 21:27    Micro Results      Recent Results (from the past 240 hour(s))  Blood culture (routine x 2)     Status: None (Preliminary result)   Collection Time: 12/08/14 10:40 PM  Result Value Ref Range Status   Specimen Description BLOOD RIGHT FOREARM  Final   Special Requests BOTTLES DRAWN AEROBIC AND ANAEROBIC 4ML  Final   Culture   Final           BLOOD CULTURE RECEIVED NO GROWTH TO DATE CULTURE WILL BE HELD FOR 5 DAYS BEFORE ISSUING A FINAL NEGATIVE REPORT Performed at [facility]    Report Status PENDING  Incomplete  Blood culture (routine x 2)     Status: None (Preliminary result)   Collection Time: 12/08/14 10:40 PM  Result Value Ref Range Status   Specimen Description BLOOD LEFT FOREARM  Final   Special Requests BOTTLES DRAWN AEROBIC AND ANAEROBIC 5ML  Final   Culture   Final           BLOOD CULTURE RECEIVED NO GROWTH TO DATE CULTURE WILL BE HELD FOR 5 DAYS BEFORE ISSUING A FINAL NEGATIVE REPORT Performed at [facility]    Report Status PENDING  Incomplete       Today   Subjective:   Terry [name] today has no headache,no chest abdominal pain,no new weakness tingling or numbness, feels much better wants to go home today.   Objective:   Blood pressure 118/75, pulse 68, temperature 97.9 F (36.6 C), temperature source Oral, resp. rate 20, height 5\' 8"  (1.727 m), weight 77.837 kg (171 lb 9.6 oz), SpO2 93 %.   Intake/Output Summary (Last 24 hours) at 12/11/14 0823 Last data filed at 12/10/14 1500  Gross per 24 hour  Intake    840 ml  Output    325 ml  Net    515 ml    Exam Awake Alert, Oriented x 3, No new F.N deficits, Normal affect .AT,PERRAL Supple Neck,No JVD, No cervical lymphadenopathy appriciated.    Symmetrical Chest wall movement, Good air movement bilaterally, minimal wheezing RRR,No Gallops,Rubs or new Murmurs, No Parasternal Heave +ve B.Sounds, Abd Soft, Non tender, No organomegaly appriciated, No rebound -guarding or rigidity. No Cyanosis, Clubbing or edema, No new Rash or bruise  Data Review   CBC w Diff: Lab Results  Component Value Date   WBC 12.0* 12/08/2014   WBC 7.8 01/16/2013   HGB 17.2* 12/08/2014   HGB 17.2 01/16/2013   HCT 49.1 12/08/2014   HCT 52.3 01/16/2013   PLT 383 12/08/2014   LYMPHOPCT 24 12/08/2014   MONOPCT 5 12/08/2014   EOSPCT 16* 12/08/2014   BASOPCT 1 12/08/2014    CMP: Lab Results  Component Value Date   NA 138 12/08/2014   K 3.8 12/08/2014   CL 104 12/08/2014   CO2 24 12/08/2014   BUN 15 12/08/2014   CREATININE 1.11 12/08/2014   CREATININE 1.25 01/16/2013   PROT 7.1 01/16/2013   ALBUMIN 4.4 01/16/2013   BILITOT 1.8* 01/16/2013  ALKPHOS 70 01/16/2013   AST 14 01/16/2013   ALT 21 01/16/2013  .   Total Time in preparing paper work, data evaluation and todays exam - 35 minutes  Thurnell Lose M.D on 12/11/2014 at 8:23 AM  [facility]   Office  [phone]

## 2014-12-15 LAB — CULTURE, BLOOD (ROUTINE X 2)
Culture: NO GROWTH
Culture: NO GROWTH

## 2014-12-16 ENCOUNTER — Inpatient Hospital Stay: Payer: Self-pay | Admitting: Family Medicine

## 2014-12-26 ENCOUNTER — Inpatient Hospital Stay: Payer: Self-pay | Admitting: Internal Medicine

## 2015-04-25 ENCOUNTER — Encounter (HOSPITAL_COMMUNITY): Payer: Self-pay | Admitting: Emergency Medicine

## 2015-04-25 ENCOUNTER — Emergency Department (HOSPITAL_COMMUNITY)
Admission: EM | Admit: 2015-04-25 | Discharge: 2015-04-26 | Disposition: A | Discharge status: Home / Self Care | Payer: Self-pay | Attending: Emergency Medicine | Admitting: Emergency Medicine

## 2015-04-25 ENCOUNTER — Emergency Department (HOSPITAL_COMMUNITY): Payer: Self-pay

## 2015-04-25 DIAGNOSIS — I10 Essential (primary) hypertension: Secondary | ICD-10-CM | POA: Insufficient documentation

## 2015-04-25 DIAGNOSIS — Z79899 Other long term (current) drug therapy: Secondary | ICD-10-CM | POA: Insufficient documentation

## 2015-04-25 DIAGNOSIS — E785 Hyperlipidemia, unspecified: Secondary | ICD-10-CM | POA: Insufficient documentation

## 2015-04-25 DIAGNOSIS — J9801 Acute bronchospasm: Secondary | ICD-10-CM | POA: Insufficient documentation

## 2015-04-25 DIAGNOSIS — Z792 Long term (current) use of antibiotics: Secondary | ICD-10-CM | POA: Insufficient documentation

## 2015-04-25 MED ORDER — IPRATROPIUM-ALBUTEROL 0.5-2.5 (3) MG/3ML IN SOLN
3.0000 mL | Freq: Once | RESPIRATORY_TRACT | Status: AC
  Administered 2015-04-25: 3 mL via RESPIRATORY_TRACT
  Filled 2015-04-25: qty 3

## 2015-04-25 MED ORDER — PREDNISONE 20 MG PO TABS
60.0000 mg | ORAL_TABLET | Freq: Once | ORAL | Status: AC
  Administered 2015-04-26: 60 mg via ORAL
  Filled 2015-04-25: qty 3

## 2015-04-25 MED ORDER — ALBUTEROL SULFATE (2.5 MG/3ML) 0.083% IN NEBU
5.0000 mg | INHALATION_SOLUTION | Freq: Once | RESPIRATORY_TRACT | Status: AC
  Administered 2015-04-25: 5 mg via RESPIRATORY_TRACT
  Filled 2015-04-25: qty 6

## 2015-04-25 NOTE — ED Notes (Signed)
Pt reports SOB started yesterday without precipitating events. 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 Denies recent colds/sickness. Was diagnosed with community acquired PNA in May but says, "I feel like I got over that." Recently ran out of Albuterol inhaler which was given to him after PNA treatment. Denies hx asthma. Audible inspiratory and expiratory wheezing without associated chest pain noted. RR even but labored. No other c/c.

## 2015-04-25 NOTE — ED Provider Notes (Signed)
CSN: [id number]     Arrival date & time 04/25/15  2232 History  This chart was scribed for Virgel Manifold, MD by Rayna Sexton, ED scribe. This patient was seen in room WA21/WA21 and the patient's care was started at 11:16 PM.  Chief Complaint  Patient presents with  . Shortness of Breath   The history is provided by the patient. No language interpreter was used.    HPI Comments: Terry [name] is a 44 y.o. male, with a hx of HLD and HTN, who presents to the Emergency Department complaining of worsening, moderate, SOB with onset 4 days ago. Pt notes having had pneumonia 2 months ago and to deal with his chronic SOB was using an albuterol inhaler as needed and since running out of his rx 4 days ago began experiencing a worsening of his SOB. He notes associated, moderate, productive cough and moderate wheezing. He notes that he recently began working a new job around [employer] and further notes a recent lack of sleep due to work which he says has exacerbated his symptoms. He denies any hx of asthma, bronchitis or breathing issues. Pt denies CP, leg swelling, fevers and chills.   Past Medical History  Diagnosis Date  . Hyperlipidemia   . Hypertension    Past Surgical History  Procedure Laterality Date  . Triceps tendon repair Left 04/2013   Family History  Problem Relation Age of Onset  . Diabetes Mother   . Diabetes Father    Social History  Substance Use Topics  . Smoking status: Never Smoker   . Smokeless tobacco: None  . Alcohol Use: Yes    Review of Systems  Respiratory: Positive for cough, shortness of breath and wheezing.   All other systems reviewed and are negative.  Allergies  Review of patient's allergies indicates no known allergies.  Home Medications   Prior to Admission medications   Medication Sig Start Date End Date Taking? Authorizing Provider  albuterol (PROVENTIL HFA;VENTOLIN HFA) 108 (90 BASE) MCG/ACT inhaler Inhale 2 puffs into the lungs every 6 (six) hours  as needed for wheezing or shortness of breath. 12/11/14   Thurnell Lose, MD  amLODipine (NORVASC) 10 MG tablet Take 1 tablet (10 mg total) by mouth daily. Patient not taking: Reported on 12/07/2014 10/18/13   Roselee Culver, MD  levofloxacin (LEVAQUIN) 750 MG tablet Take 1 tablet (750 mg total) by mouth daily. 12/11/14   Thurnell Lose, MD  Phenylephrine-DM-GG 2.5-5-100 MG/5ML LIQD Take 30 mLs by mouth every 4 (four) hours as needed (for cold and cough).    Historical Provider, MD  pravastatin (PRAVACHOL) 40 MG tablet Take 1 tablet (40 mg total) by mouth daily. Patient not taking: Reported on 12/07/2014 10/18/13   Roselee Culver, MD  predniSONE (DELTASONE) 5 MG tablet Label  & dispense according to the schedule below. 6 Pills PO for 3 days, 4 Pills PO for 3 days, 2 Pills PO for 3 days, 1 Pills PO for 3 days, 1/2 Pill  PO for 3 days then STOP. Total 95 pills. 12/11/14   Thurnell Lose, MD  valACYclovir (VALTREX) 1000 MG tablet Take as instructed. Patient not taking: Reported on 12/07/2014 10/18/13   Roselee Culver, MD  valACYclovir (VALTREX) 1000 MG tablet Take 1 tablet (1,000 mg total) by mouth 3 (three) times daily. Patient not taking: Reported on 12/07/2014 10/18/13   Roselee Culver, MD   BP 167/112 mmHg  Pulse 92  Temp(Src) 98.4 F (36.9 C) (  Oral)  Resp 30  SpO2 91% Physical Exam  Constitutional: He is oriented to person, place, and time. He appears well-developed and well-nourished.  HENT:  Head: Normocephalic and atraumatic.  Mouth/Throat: No oropharyngeal exudate.  Neck: Normal range of motion. No tracheal deviation present.  Cardiovascular: Normal rate.   Pulmonary/Chest: Tachypnea noted. He has wheezes.  Course breath sounds bilaterally expiratory wheezing; tachypnea present  Abdominal: Soft. There is no tenderness.  Musculoskeletal: Normal range of motion. He exhibits no edema or tenderness.  No LE edema or back tenderness  Neurological: He is alert and oriented to  person, place, and time.  Skin: Skin is warm and dry. He is not diaphoretic.  Psychiatric: He has a normal mood and affect. His behavior is normal.  Nursing note and vitals reviewed.  ED Course  Procedures  DIAGNOSTIC STUDIES: Oxygen Saturation is 91% on RA, low by my interpretation.    COORDINATION OF CARE: 11:19 PM Discussed treatment plan with pt at bedside and pt agreed to plan.  Labs Review Labs Reviewed - No data to display   Dg Chest 2 View  04/25/2015   CLINICAL DATA:  Shortness of breath starting yesterday.  EXAM: CHEST  2 VIEW  COMPARISON:  12/10/2014  FINDINGS: Normal inspiration. Normal heart size and pulmonary vascularity. Peribronchial thickening with streaky perihilar opacities probably representing asthma or bronchiolitis. No focal consolidation or airspace disease. No blunting of costophrenic angles. No pneumothorax. Mediastinal contours appear intact.  IMPRESSION: Peribronchial and perihilar changes likely representing asthma or bronchiolitis.   Electronically Signed   By: Lucienne Capers M.D.   On: 04/25/2015 23:29    Imaging Review Dg Chest 2 View  04/25/2015   CLINICAL DATA:  Shortness of breath starting yesterday.  EXAM: CHEST  2 VIEW  COMPARISON:  12/10/2014  FINDINGS: Normal inspiration. Normal heart size and pulmonary vascularity. Peribronchial thickening with streaky perihilar opacities probably representing asthma or bronchiolitis. No focal consolidation or airspace disease. No blunting of costophrenic angles. No pneumothorax. Mediastinal contours appear intact.  IMPRESSION: Peribronchial and perihilar changes likely representing asthma or bronchiolitis.   Electronically Signed   By: Lucienne Capers M.D.   On: 04/25/2015 23:29   I have personally reviewed and evaluated these images and lab results as part of my medical decision-making.   EKG Interpretation None      MDM   Final diagnoses:  Acute bronchospasm    1:26 AM Pt sleeping in bed on recheck.  Mild tachypnea right around 20 but otherwise appears comfortable. Better air movement. Reports feels significantly better. Will give albuterol MDI at DC. Continued steroids.       Virgel Manifold, MD 05/03/15 [phone]

## 2015-04-26 MED ORDER — PREDNISONE 20 MG PO TABS: 40.0000 mg | ORAL_TABLET | Freq: Every day | ORAL | Status: DC

## 2015-04-26 MED ORDER — ALBUTEROL SULFATE HFA 108 (90 BASE) MCG/ACT IN AERS
2.0000 | INHALATION_SPRAY | Freq: Once | RESPIRATORY_TRACT | Status: AC
  Administered 2015-04-26: 2 via RESPIRATORY_TRACT
  Filled 2015-04-26: qty 6.7

## 2015-04-26 NOTE — Discharge Instructions (Signed)

## 2015-07-23 ENCOUNTER — Emergency Department (HOSPITAL_COMMUNITY): Payer: Self-pay

## 2015-07-23 ENCOUNTER — Encounter (HOSPITAL_COMMUNITY): Payer: Self-pay | Admitting: *Deleted

## 2015-07-23 ENCOUNTER — Emergency Department (HOSPITAL_COMMUNITY)
Admission: EM | Admit: 2015-07-23 | Discharge: 2015-07-23 | Disposition: A | Discharge status: Home / Self Care | Payer: Self-pay | Attending: Emergency Medicine | Admitting: Emergency Medicine

## 2015-07-23 DIAGNOSIS — J40 Bronchitis, not specified as acute or chronic: Secondary | ICD-10-CM | POA: Insufficient documentation

## 2015-07-23 DIAGNOSIS — E785 Hyperlipidemia, unspecified: Secondary | ICD-10-CM | POA: Insufficient documentation

## 2015-07-23 DIAGNOSIS — Z7952 Long term (current) use of systemic steroids: Secondary | ICD-10-CM | POA: Insufficient documentation

## 2015-07-23 DIAGNOSIS — Z79899 Other long term (current) drug therapy: Secondary | ICD-10-CM | POA: Insufficient documentation

## 2015-07-23 DIAGNOSIS — I10 Essential (primary) hypertension: Secondary | ICD-10-CM | POA: Insufficient documentation

## 2015-07-23 MED ORDER — ALBUTEROL SULFATE (2.5 MG/3ML) 0.083% IN NEBU
5.0000 mg | INHALATION_SOLUTION | Freq: Once | RESPIRATORY_TRACT | Status: AC
  Administered 2015-07-23: 5 mg via RESPIRATORY_TRACT
  Filled 2015-07-23: qty 6

## 2015-07-23 MED ORDER — ALBUTEROL SULFATE HFA 108 (90 BASE) MCG/ACT IN AERS
2.0000 | INHALATION_SPRAY | RESPIRATORY_TRACT | Status: DC | PRN
  Administered 2015-07-23: 2 via RESPIRATORY_TRACT
  Filled 2015-07-23: qty 6.7

## 2015-07-23 MED ORDER — IPRATROPIUM BROMIDE 0.02 % IN SOLN
0.5000 mg | Freq: Once | RESPIRATORY_TRACT | Status: AC
  Administered 2015-07-23: 0.5 mg via RESPIRATORY_TRACT
  Filled 2015-07-23: qty 2.5

## 2015-07-23 MED ORDER — PREDNISONE 20 MG PO TABS
60.0000 mg | ORAL_TABLET | Freq: Once | ORAL | Status: AC
  Administered 2015-07-23: 60 mg via ORAL
  Filled 2015-07-23: qty 3

## 2015-07-23 MED ORDER — ALBUTEROL SULFATE HFA 108 (90 BASE) MCG/ACT IN AERS: 2.0000 | INHALATION_SPRAY | Freq: Four times a day (QID) | RESPIRATORY_TRACT | Status: DC | PRN

## 2015-07-23 MED ORDER — PREDNISONE 20 MG PO TABS: ORAL_TABLET | ORAL | Status: DC

## 2015-07-23 NOTE — ED Notes (Signed)
Pt states he was just here for bronchitis and prescribed inhaler, but couldn't afford it. Asking for a treatment again today

## 2015-07-23 NOTE — Discharge Instructions (Signed)

## 2015-07-23 NOTE — ED Provider Notes (Signed)
CSN: [id number]     Arrival date & time 07/23/15  1335 History  By signing my name below, I, Terry [name], attest that this documentation has been prepared under the direction and in the presence of Domenic Moras, PA-C. Electronically Signed: Julien [name], ED Scribe. 07/23/2015. 2:32 PM.    Chief Complaint  Patient presents with  . Wheezing  . Shortness of Breath      The history is provided by the patient. No language interpreter was used.   HPI Comments: Terry [name] is a 44 y.o. male who has a hx of HTN and hyperlipidemia presents to the Emergency Department complaining of constant, gradual worsening wheezing and shortness of breath onset one week ago. Pt states yesterday his symptoms worsened after starting his new job that required physical activity. He states his wheezing is worse upon exertion. Pt reports using an albuterol treatment to alleviate his symptoms with relief but he states he has been out for the past couple weeks because he cannot afford it. Pt was hospitalized earlier this year for pneumonia and has a hx of bronchitis. Pt denies hx of asthma, rhinorrhea, fever, chills, sore throat, vomiting, and diarrhea. Pt is a non-smoker.  Past Medical History  Diagnosis Date  . Hyperlipidemia   . Hypertension    Past Surgical History  Procedure Laterality Date  . Triceps tendon repair Left 04/2013   Family History  Problem Relation Age of Onset  . Diabetes Mother   . Diabetes Father    Social History  Substance Use Topics  . Smoking status: Never Smoker   . Smokeless tobacco: None  . Alcohol Use: Yes    Review of Systems  Respiratory: Positive for shortness of breath and wheezing.       Allergies  Review of patient's allergies indicates no known allergies.  Home Medications   Prior to Admission medications   Medication Sig Start Date End Date Taking? Authorizing Provider  albuterol (PROVENTIL HFA;VENTOLIN HFA) 108 (90 BASE) MCG/ACT inhaler Inhale 2 puffs into  the lungs every 6 (six) hours as needed for wheezing or shortness of breath. 12/11/14   Thurnell Lose, MD  amLODipine (NORVASC) 10 MG tablet Take 1 tablet (10 mg total) by mouth daily. Patient not taking: Reported on 12/07/2014 10/18/13   Roselee Culver, MD  levofloxacin (LEVAQUIN) 750 MG tablet Take 1 tablet (750 mg total) by mouth daily. Patient not taking: Reported on 04/25/2015 12/11/14   Thurnell Lose, MD  Phenylephrine-DM-GG 2.5-5-100 MG/5ML LIQD Take 30 mLs by mouth every 4 (four) hours as needed (for cold and cough).    Historical Provider, MD  pravastatin (PRAVACHOL) 40 MG tablet Take 1 tablet (40 mg total) by mouth daily. Patient not taking: Reported on 12/07/2014 10/18/13   Roselee Culver, MD  predniSONE (DELTASONE) 20 MG tablet Take 2 tablets (40 mg total) by mouth daily. 04/26/15   Virgel Manifold, MD  predniSONE (DELTASONE) 5 MG tablet Label  & dispense according to the schedule below. 6 Pills PO for 3 days, 4 Pills PO for 3 days, 2 Pills PO for 3 days, 1 Pills PO for 3 days, 1/2 Pill  PO for 3 days then STOP. Total 95 pills. Patient not taking: Reported on 04/25/2015 12/11/14   Thurnell Lose, MD  valACYclovir (VALTREX) 1000 MG tablet Take as instructed. Patient not taking: Reported on 12/07/2014 10/18/13   Roselee Culver, MD  valACYclovir (VALTREX) 1000 MG tablet Take 1 tablet (1,000 mg total) by mouth 3 (  three) times daily. Patient not taking: Reported on 12/07/2014 10/18/13   Roselee Culver, MD   Triage vitals: BP 165/112 mmHg  Pulse 91  Temp(Src) 97.6 F (36.4 C) (Oral)  Resp 16  SpO2 95% Physical Exam  Constitutional: He appears well-developed and well-nourished. No distress.  HENT:  Head: Normocephalic and atraumatic.  Eyes: Right eye exhibits no discharge. Left eye exhibits no discharge.  Pulmonary/Chest: Effort normal. No respiratory distress. He has wheezes. He has no rales.  Inspiratory and expiratory wheezes heard throughout  Neurological: He is alert.  Coordination normal.  Skin: No rash noted. He is not diaphoretic.  Psychiatric: He has a normal mood and affect. His behavior is normal.  Nursing note and vitals reviewed.   ED Course  Procedures  DIAGNOSTIC STUDIES: Oxygen Saturation is 95% on RA, adequate by my interpretation.  COORDINATION OF CARE:  2:31 PM Discussed treatment plan which includes nebulizer treatment with pt at bedside and pt agreed to plan.  3:37 PM Evidence of bronchitis.  Will give albuterol inhaler and steroid burst/taper.  Reeturn precaution discussed.  Pt felt much better after receiving breathing treatment in ER.    Labs Review Labs Reviewed - No data to display  Imaging Review Dg Chest 2 View  07/23/2015  CLINICAL DATA:  Shortness breath and wheezing since yesterday, progressively worsening. History of bronchitis. EXAM: CHEST  2 VIEW COMPARISON:  04/25/2015 FINDINGS: Heart size is normal. Mediastinal shadows are normal. There is central bronchial thickening. There is mild chronic volume loss in the right middle lobe. No evidence of consolidation or lobar collapse. No effusions. Bony structures are unremarkable. IMPRESSION: Bronchial thickening consistent with bronchitis. Mild chronic volume loss in the right middle lobe. Electronically Signed   By: Nelson Chimes M.D.   On: 07/23/2015 14:31   I have personally reviewed and evaluated these images and lab results as part of my medical decision-making.   EKG Interpretation None      MDM   Final diagnoses:  Bronchitis    BP 165/112 mmHg  Pulse 91  Temp(Src) 97.6 F (36.4 C) (Oral)  Resp 16  SpO2 95% The patient was noted to be hypertensive today in the emergency department. I have spoken with the patient regarding hypertension and the need for improved management. I instructed the patient to followup with the Primary care doctor within 4 days to improve the management of the patient's hypertension. I also counseled the patient regarding the signs and  symptoms which would require an emergent visit to an emergency department for hypertensive urgency and/or hypertensive emergency. The patient understood the need for improved hypertensive management.  I personally performed the services described in this documentation, which was scribed in my presence. The recorded information has been reviewed and is accurate.     Domenic Moras, PA-C 07/23/15 1538  Dorie Rank, MD 07/23/15 [phone]

## 2015-07-23 NOTE — ED Notes (Addendum)
Pt complains of wheezing and shortness of breath since yesterday. Pt states he used an albuterol inhaler yesterday, which he states helped at the time. Pt states he woke up this morning with wheezing and shortness of breath. Pt states he primarily came for a doctor's note due to starting a new job and could not afford to buy inhaler he was prescribed at his last visit 3 months ago. Pt states he also has not been taking his BP medication because he cannot afford it.

## 2015-09-09 ENCOUNTER — Encounter (HOSPITAL_COMMUNITY): Payer: Self-pay | Admitting: *Deleted

## 2015-09-09 ENCOUNTER — Emergency Department (HOSPITAL_COMMUNITY): Payer: Self-pay

## 2015-09-09 ENCOUNTER — Emergency Department (HOSPITAL_COMMUNITY)
Admission: EM | Admit: 2015-09-09 | Discharge: 2015-09-09 | Disposition: A | Discharge status: Home / Self Care | Payer: Self-pay | Attending: Emergency Medicine | Admitting: Emergency Medicine

## 2015-09-09 DIAGNOSIS — Z8701 Personal history of pneumonia (recurrent): Secondary | ICD-10-CM | POA: Insufficient documentation

## 2015-09-09 DIAGNOSIS — E785 Hyperlipidemia, unspecified: Secondary | ICD-10-CM | POA: Insufficient documentation

## 2015-09-09 DIAGNOSIS — J988 Other specified respiratory disorders: Secondary | ICD-10-CM

## 2015-09-09 DIAGNOSIS — Z79899 Other long term (current) drug therapy: Secondary | ICD-10-CM | POA: Insufficient documentation

## 2015-09-09 DIAGNOSIS — I10 Essential (primary) hypertension: Secondary | ICD-10-CM | POA: Insufficient documentation

## 2015-09-09 HISTORY — DX: Pneumonia, unspecified organism: J18.9

## 2015-09-09 MED ORDER — PREDNISONE 20 MG PO TABS: ORAL_TABLET | ORAL | Status: DC

## 2015-09-09 MED ORDER — ALBUTEROL SULFATE (2.5 MG/3ML) 0.083% IN NEBU
5.0000 mg | INHALATION_SOLUTION | Freq: Once | RESPIRATORY_TRACT | Status: DC
  Filled 2015-09-09: qty 6

## 2015-09-09 MED ORDER — ALBUTEROL SULFATE HFA 108 (90 BASE) MCG/ACT IN AERS
2.0000 | INHALATION_SPRAY | Freq: Once | RESPIRATORY_TRACT | Status: AC
  Administered 2015-09-09: 2 via RESPIRATORY_TRACT
  Filled 2015-09-09: qty 6.7

## 2015-09-09 MED ORDER — IPRATROPIUM-ALBUTEROL 0.5-2.5 (3) MG/3ML IN SOLN
3.0000 mL | RESPIRATORY_TRACT | Status: AC
  Administered 2015-09-09 (×3): 3 mL via RESPIRATORY_TRACT
  Filled 2015-09-09: qty 9

## 2015-09-09 MED ORDER — AEROCHAMBER PLUS W/MASK MISC
1.0000 | Freq: Once | Status: AC
  Administered 2015-09-09: 1
  Filled 2015-09-09: qty 1

## 2015-09-09 MED ORDER — PREDNISONE 20 MG PO TABS
40.0000 mg | ORAL_TABLET | Freq: Once | ORAL | Status: AC
  Administered 2015-09-09: 40 mg via ORAL
  Filled 2015-09-09: qty 2

## 2015-09-09 NOTE — ED Notes (Signed)
Pt reports sob, cough and wheezing x 2 weeks.  Was admitted about a year ago and was found to have had PNA and bronchitis.  Pt reports pain in her chest when coughing.

## 2015-09-09 NOTE — Discharge Instructions (Signed)
User inhaler every 4 hours while awake 4 puffs for the next couple days. I have provided contact information for a pulmonology group in the area. It may be a good idea if you're having recurrent episodes of this to see a lung doctor.

## 2015-09-09 NOTE — ED Notes (Signed)
RT [facility] contacted to start neb tx

## 2015-09-09 NOTE — ED Provider Notes (Signed)
CSN: [id number]     Arrival date & time 09/09/15  1306 History   First MD Initiated Contact with Patient 09/09/15 1318     Chief Complaint  Patient presents with  . Wheezing  . Respiratory Distress     (Consider location/radiation/quality/duration/timing/severity/associated sxs/prior Treatment) Patient is a 45 y.o. male presenting with shortness of breath. The history is provided by the patient.  Shortness of Breath Severity:  Moderate Onset quality:  Sudden Duration:  2 weeks Timing:  Constant Progression:  Worsening Chronicity:  Recurrent Context: URI   Relieved by:  Nothing Worsened by:  Nothing tried Ineffective treatments:  None tried Associated symptoms: wheezing   Associated symptoms: no abdominal pain, no chest pain, no fever, no headaches, no rash and no vomiting     45 yo M with a chief complaint shortness of breath. This been going on for the past couple weeks and getting slowly worsened. Patient has had cough congestion denies fevers or chills. Has a history of illnesses like this in the past and was told he had bronchitis and pneumonia. Patient denies history of asthma or COPD. Denies smoking history.  Past Medical History  Diagnosis Date  . Hyperlipidemia   . Hypertension   . Pneumonia    Past Surgical History  Procedure Laterality Date  . Triceps tendon repair Left 04/2013   Family History  Problem Relation Age of Onset  . Diabetes Mother   . Diabetes Father    Social History  Substance Use Topics  . Smoking status: Never Smoker   . Smokeless tobacco: None  . Alcohol Use: Yes    Review of Systems  Constitutional: Negative for fever and chills.  HENT: Negative for congestion and facial swelling.   Eyes: Negative for discharge and visual disturbance.  Respiratory: Positive for shortness of breath and wheezing.   Cardiovascular: Negative for chest pain and palpitations.  Gastrointestinal: Negative for vomiting, abdominal pain and diarrhea.   Musculoskeletal: Negative for myalgias and arthralgias.  Skin: Negative for color change and rash.  Neurological: Negative for tremors, syncope and headaches.  Psychiatric/Behavioral: Negative for confusion and dysphoric mood.      Allergies  Review of patient's allergies indicates no known allergies.  Home Medications   Prior to Admission medications   Medication Sig Start Date End Date Taking? Authorizing Provider  amLODipine (NORVASC) 10 MG tablet Take 1 tablet (10 mg total) by mouth daily. 10/18/13  Yes Roselee Culver, MD  Phenylephrine-DM-GG 2.5-5-100 MG/5ML LIQD Take 30 mLs by mouth every 4 (four) hours as needed (for cold and cough).   Yes Historical Provider, MD  pravastatin (PRAVACHOL) 40 MG tablet Take 1 tablet (40 mg total) by mouth daily. 10/18/13  Yes Roselee Culver, MD  valACYclovir (VALTREX) 1000 MG tablet Take as instructed. Patient taking differently: Take 1,000 mg by mouth 2 (two) times daily as needed (cold sore breakouts). Take as instructed. 10/18/13  Yes Roselee Culver, MD  albuterol (PROVENTIL HFA;VENTOLIN HFA) 108 (90 BASE) MCG/ACT inhaler Inhale 2 puffs into the lungs every 6 (six) hours as needed for wheezing or shortness of breath. Patient not taking: Reported on 09/09/2015 07/23/15   Domenic Moras, PA-C  predniSONE (DELTASONE) 20 MG tablet 2 tabs po daily x 4 days 09/09/15   Deno Etienne, DO   BP 140/86 mmHg  Pulse 77  Temp(Src) 97.9 F (36.6 C) (Oral)  Resp 20  SpO2 91% Physical Exam  Constitutional: He is oriented to person, place, and time. He appears well-developed  and well-nourished.  HENT:  Head: Normocephalic and atraumatic.  Eyes: EOM are normal. Pupils are equal, round, and reactive to light.  Neck: Normal range of motion. Neck supple. No JVD present.  Cardiovascular: Normal rate and regular rhythm.  Exam reveals no gallop and no friction rub.   No murmur heard. Pulmonary/Chest: No respiratory distress. He has wheezes (diffuse with prolonged  expiration).  Abdominal: He exhibits no distension. There is no tenderness. There is no rebound and no guarding.  Musculoskeletal: Normal range of motion.  Neurological: He is alert and oriented to person, place, and time.  Skin: No rash noted. No pallor.  Psychiatric: He has a normal mood and affect. His behavior is normal.  Nursing note and vitals reviewed.   ED Course  Procedures (including critical care time) Labs Review Labs Reviewed - No data to display  Imaging Review Dg Chest 2 View  09/09/2015  CLINICAL DATA:  Shortness of breath with cough and wheezing for 2 weeks EXAM: CHEST  2 VIEW COMPARISON:  July 23, 2015 FINDINGS: Mild central peribronchial thickening is stable. There is no frank edema or consolidation. Heart size and pulmonary vascularity are normal. No adenopathy. No bone lesions. IMPRESSION: Findings indicative of a degree of chronic bronchitis centrally. No edema or consolidation. Electronically Signed   By: Lowella Grip III M.D.   On: 09/09/2015 14:29   I have personally reviewed and evaluated these images and lab results as part of my medical decision-making.   EKG Interpretation None      MDM   Final diagnoses:  Wheezing-associated respiratory infection (WARI)    45 yo M with a chief complaint of shortness of breath. Patient with diffuse wheezes on exam with prolonged expiration. Patient does not have a history of asthma as far as he knows. We'll give 3 DuoNeb's back-to-back and steroids and reassess.  Patient feels much better after getting breathing treatments. Lungs with much improved aeration trace wheezes. Will give the patient an albuterol inhaler with a spacer have him follow-up with his family doctor. Given referral to pulmonology as this is a recurrent issue for him.  2:37 PM:  I have discussed the diagnosis/risks/treatment options with the patient and believe the pt to be eligible for discharge home to follow-up with PCP/pulm. We also  discussed returning to the ED immediately if new or worsening sx occur. We discussed the sx which are most concerning (e.g., sudden worsening sob, need to use inhaler more often than every 4 hours, fever, inability to tolerate by mouth) that necessitate immediate return. Medications administered to the patient during their visit and any new prescriptions provided to the patient are listed below.  Medications given during this visit Medications  ipratropium-albuterol (DUONEB) 0.5-2.5 (3) MG/3ML nebulizer solution 3 mL (3 mLs Nebulization Given 09/09/15 1330)  predniSONE (DELTASONE) tablet 40 mg (40 mg Oral Given 09/09/15 1326)  aerochamber plus with mask device 1 each (1 each Other Given 09/09/15 1436)  albuterol (PROVENTIL HFA;VENTOLIN HFA) 108 (90 Base) MCG/ACT inhaler 2 puff (2 puffs Inhalation Given 09/09/15 1436)    New Prescriptions   PREDNISONE (DELTASONE) 20 MG TABLET    2 tabs po daily x 4 days    The patient appears reasonably screen and/or stabilized for discharge and I doubt any other medical condition or other [facility] requiring further screening, evaluation, or treatment in the ED at this time prior to discharge.    Deno Etienne, DO 09/09/15 1437

## 2015-11-22 ENCOUNTER — Emergency Department (HOSPITAL_COMMUNITY)
Admission: EM | Admit: 2015-11-22 | Discharge: 2015-11-22 | Disposition: A | Discharge status: Home / Self Care | Payer: Self-pay | Attending: Emergency Medicine | Admitting: Emergency Medicine

## 2015-11-22 ENCOUNTER — Emergency Department (HOSPITAL_COMMUNITY): Payer: Self-pay

## 2015-11-22 ENCOUNTER — Encounter (HOSPITAL_COMMUNITY): Payer: Self-pay | Admitting: Emergency Medicine

## 2015-11-22 DIAGNOSIS — R0603 Acute respiratory distress: Secondary | ICD-10-CM

## 2015-11-22 DIAGNOSIS — I10 Essential (primary) hypertension: Secondary | ICD-10-CM | POA: Insufficient documentation

## 2015-11-22 DIAGNOSIS — E785 Hyperlipidemia, unspecified: Secondary | ICD-10-CM | POA: Insufficient documentation

## 2015-11-22 DIAGNOSIS — R0600 Dyspnea, unspecified: Secondary | ICD-10-CM | POA: Insufficient documentation

## 2015-11-22 DIAGNOSIS — Z79899 Other long term (current) drug therapy: Secondary | ICD-10-CM | POA: Insufficient documentation

## 2015-11-22 MED ORDER — ALBUTEROL (5 MG/ML) CONTINUOUS INHALATION SOLN
10.0000 mg/h | INHALATION_SOLUTION | RESPIRATORY_TRACT | Status: DC
  Administered 2015-11-22: 10 mg/h via RESPIRATORY_TRACT
  Filled 2015-11-22: qty 20

## 2015-11-22 MED ORDER — ALBUTEROL SULFATE HFA 108 (90 BASE) MCG/ACT IN AERS
2.0000 | INHALATION_SPRAY | Freq: Four times a day (QID) | RESPIRATORY_TRACT | Status: DC
  Administered 2015-11-22: 2 via RESPIRATORY_TRACT
  Filled 2015-11-22: qty 6.7

## 2015-11-22 MED ORDER — PREDNISONE 20 MG PO TABS: 40.0000 mg | ORAL_TABLET | Freq: Every day | ORAL | Status: DC

## 2015-11-22 MED ORDER — ALBUTEROL SULFATE (2.5 MG/3ML) 0.083% IN NEBU
5.0000 mg | INHALATION_SOLUTION | Freq: Once | RESPIRATORY_TRACT | Status: AC
  Administered 2015-11-22: 5 mg via RESPIRATORY_TRACT
  Filled 2015-11-22: qty 6

## 2015-11-22 MED ORDER — METHYLPREDNISOLONE SODIUM SUCC 125 MG IJ SOLR
125.0000 mg | Freq: Once | INTRAMUSCULAR | Status: AC
  Administered 2015-11-22: 125 mg via INTRAVENOUS
  Filled 2015-11-22: qty 2

## 2015-11-22 NOTE — ED Notes (Signed)
Pt reports SOB and wheezing for the past 3 days. Pt SOB in triage, speaking in short sentences. 93% on RA.

## 2015-11-22 NOTE — Discharge Instructions (Signed)
As discussed, it is important that he follow up with pulmonology colleagues for further evaluation of recurrent episodes of respiratory distress.  Next 2 days, please use albuterol, every 4 hours in addition to the prescribed steroids.  Return here for concerning changes in your condition.

## 2015-11-22 NOTE — ED Provider Notes (Addendum)
CSN: [id number]     Arrival date & time 11/22/15  1604 History   First MD Initiated Contact with Patient 11/22/15 1641     Chief Complaint  Patient presents with  . Shortness of Breath  . Wheezing     (Consider location/radiation/quality/duration/timing/severity/associated sxs/prior Treatment) HPI Patient presents with concern of dyspnea. Symptoms began about one week ago.  Patient had remaining albuterol, and this mitigated his symptoms until 3 days ago. Patient ran out of medication, and since that time has had increasing dyspnea, generalized discomfort, lightheadedness. No fever, substantial cough, syncope. Patient has history of recurrent pulmonary issues over the past year, but no history of emphysema, COPD, asthma. Patient does not smoke.  Past Medical History  Diagnosis Date  . Hyperlipidemia   . Hypertension   . Pneumonia    Past Surgical History  Procedure Laterality Date  . Triceps tendon repair Left 04/2013   Family History  Problem Relation Age of Onset  . Diabetes Mother   . Diabetes Father    Social History  Substance Use Topics  . Smoking status: Never Smoker   . Smokeless tobacco: None  . Alcohol Use: Yes    Review of Systems  Constitutional:       Per HPI, otherwise negative  HENT:       Per HPI, otherwise negative  Respiratory:       Per HPI, otherwise negative  Cardiovascular:       Per HPI, otherwise negative  Gastrointestinal: Negative for vomiting.  Endocrine:       Negative aside from HPI  Genitourinary:       Neg aside from HPI   Musculoskeletal:       Per HPI, otherwise negative  Skin: Negative.   Neurological: Negative for syncope.      Allergies  Review of patient's allergies indicates no known allergies.  Home Medications   Prior to Admission medications   Medication Sig Start Date End Date Taking? Authorizing Provider  albuterol (PROVENTIL HFA;VENTOLIN HFA) 108 (90 BASE) MCG/ACT inhaler Inhale 2 puffs into the lungs  every 6 (six) hours as needed for wheezing or shortness of breath. 07/23/15  Yes Domenic Moras, PA-C  amLODipine (NORVASC) 10 MG tablet Take 1 tablet (10 mg total) by mouth daily. 10/18/13  Yes Roselee Culver, MD  pravastatin (PRAVACHOL) 40 MG tablet Take 1 tablet (40 mg total) by mouth daily. 10/18/13  Yes Roselee Culver, MD  valACYclovir (VALTREX) 1000 MG tablet Take as instructed. Patient taking differently: Take 1,000 mg by mouth 2 (two) times daily as needed (cold sore breakouts). Take as instructed. 10/18/13  Yes Roselee Culver, MD  predniSONE (DELTASONE) 20 MG tablet 2 tabs po daily x 4 days Patient not taking: Reported on 11/22/2015 09/09/15   Deno Etienne, DO   BP 129/86 mmHg  Pulse 106  Temp(Src) 98.2 F (36.8 C) (Oral)  Resp 22  SpO2 94% Physical Exam  Constitutional: He is oriented to person, place, and time. He appears well-developed. No distress.  HENT:  Head: Normocephalic and atraumatic.  Eyes: Conjunctivae and EOM are normal.  Cardiovascular: Normal rate and regular rhythm.   Pulmonary/Chest: No stridor. He is in respiratory distress. He has wheezes.  Abdominal: He exhibits no distension.  Musculoskeletal: He exhibits no edema.  Neurological: He is alert and oriented to person, place, and time.  Skin: Skin is warm and dry.  Psychiatric: He has a normal mood and affect.  Nursing note and vitals reviewed.  ED Course  Procedures (including critical care time)  Imaging Review Dg Chest 2 View  11/22/2015  CLINICAL DATA:  Respiratory distress EXAM: CHEST  2 VIEW COMPARISON:  09/09/2015 chest radiograph. FINDINGS: Stable cardiomediastinal silhouette with normal heart size. No pneumothorax. No pleural effusion. Lungs appear clear, with no acute consolidative airspace disease and no pulmonary edema. IMPRESSION: No active cardiopulmonary disease. Electronically Signed   By: Ilona Sorrel M.D.   On: 11/22/2015 18:19   I have personally reviewed and evaluated these images and  lab results as part of my medical decision-making.  Following initial albuterol session the patient has substantially improved work of breathing.  Pulse oximetry is 95%, as the cannula, abnormal  7:15 PM Patient substantially better, have included individual of ureteral session, continues nebulizer session. I reviewed all findings, and given the patient's current episodes of respiratory distress, he will follow up with pulmonology.  Patient is minimally tachycardic, not hypoxic.   MDM  Patient presents with ongoing respiratory distress. Here, the patient is initially tachycardic, tachypneic, borderline hypoxic. After multiple albuterol sessions, and IV steroids, the patient improved substantially. No E/O PE, no risk factors for ACS, no xr e/o PTX. She started on a home therapy course, will follow-up in the pulmonology clinic.  Carmin Muskrat, MD 11/22/15 1916  Carmin Muskrat, MD 11/22/15 [phone]

## 2015-11-22 NOTE — ED Notes (Signed)
Pt reports nebulizer treatment is "opening him up."

## 2016-01-05 ENCOUNTER — Encounter (HOSPITAL_COMMUNITY): Payer: Self-pay | Admitting: Emergency Medicine

## 2016-01-05 ENCOUNTER — Emergency Department (HOSPITAL_COMMUNITY)
Admission: EM | Admit: 2016-01-05 | Discharge: 2016-01-05 | Disposition: A | Discharge status: Home / Self Care | Payer: Self-pay | Attending: Emergency Medicine | Admitting: Emergency Medicine

## 2016-01-05 DIAGNOSIS — F1110 Opioid abuse, uncomplicated: Secondary | ICD-10-CM | POA: Insufficient documentation

## 2016-01-05 DIAGNOSIS — I10 Essential (primary) hypertension: Secondary | ICD-10-CM | POA: Insufficient documentation

## 2016-01-05 DIAGNOSIS — Z79899 Other long term (current) drug therapy: Secondary | ICD-10-CM | POA: Insufficient documentation

## 2016-01-05 DIAGNOSIS — E785 Hyperlipidemia, unspecified: Secondary | ICD-10-CM | POA: Insufficient documentation

## 2016-01-05 DIAGNOSIS — F1910 Other psychoactive substance abuse, uncomplicated: Secondary | ICD-10-CM

## 2016-01-05 DIAGNOSIS — Z008 Encounter for other general examination: Secondary | ICD-10-CM | POA: Insufficient documentation

## 2016-01-05 DIAGNOSIS — F1410 Cocaine abuse, uncomplicated: Secondary | ICD-10-CM | POA: Insufficient documentation

## 2016-01-05 DIAGNOSIS — R51 Headache: Secondary | ICD-10-CM | POA: Insufficient documentation

## 2016-01-05 MED ORDER — CLONIDINE HCL 0.1 MG PO TABS
0.2000 mg | ORAL_TABLET | Freq: Once | ORAL | Status: AC
  Administered 2016-01-05: 0.2 mg via ORAL
  Filled 2016-01-05: qty 2

## 2016-01-05 MED ORDER — CLONIDINE HCL 0.2 MG PO TABS: 0.2000 mg | ORAL_TABLET | Freq: Two times a day (BID) | ORAL | Status: DC

## 2016-01-05 MED ORDER — ONDANSETRON 4 MG PO TBDP
8.0000 mg | ORAL_TABLET | Freq: Once | ORAL | Status: AC
  Administered 2016-01-05: 8 mg via ORAL

## 2016-01-05 MED ORDER — ONDANSETRON 4 MG PO TBDP
ORAL_TABLET | ORAL | Status: AC
  Filled 2016-01-05: qty 2

## 2016-01-05 NOTE — ED Provider Notes (Signed)
CSN: [id number]     Arrival date & time 01/05/16  1716 History   First MD Initiated Contact with Patient 01/05/16 1741     Chief Complaint  Patient presents with  . Withdrawal    (Consider location/radiation/quality/duration/timing/severity/associated sxs/prior Treatment) HPI 45 y.o. male with a hx of drug abuse, presents to the Emergency Department today for heroin/cocaine detox. Notes last use yesterday. Denies any other substance use. Notes generalized pain that is 10/10. No CP/SOB/ABD pain. Notes nausea with no emesis or diarrhea. No headaches. No fevers. No SI or HI. No other symptoms noted.    Past Medical History  Diagnosis Date  . Hyperlipidemia   . Hypertension   . Pneumonia    Past Surgical History  Procedure Laterality Date  . Triceps tendon repair Left 04/2013   Family History  Problem Relation Age of Onset  . Diabetes Mother   . Diabetes Father    Social History  Substance Use Topics  . Smoking status: Never Smoker   . Smokeless tobacco: None  . Alcohol Use: Yes    Review of Systems  Constitutional: Negative for fever.  Respiratory: Negative for chest tightness and shortness of breath.   Gastrointestinal: Positive for nausea. Negative for vomiting.   Allergies  Review of patient's allergies indicates no known allergies.  Home Medications   Prior to Admission medications   Medication Sig Start Date End Date Taking? Authorizing Provider  albuterol (PROVENTIL HFA;VENTOLIN HFA) 108 (90 BASE) MCG/ACT inhaler Inhale 2 puffs into the lungs every 6 (six) hours as needed for wheezing or shortness of breath. 07/23/15   Domenic Moras, PA-C  amLODipine (NORVASC) 10 MG tablet Take 1 tablet (10 mg total) by mouth daily. 10/18/13   Roselee Culver, MD  pravastatin (PRAVACHOL) 40 MG tablet Take 1 tablet (40 mg total) by mouth daily. 10/18/13   Roselee Culver, MD  predniSONE (DELTASONE) 20 MG tablet Take 2 tablets (40 mg total) by mouth daily with breakfast. For the next  four days 11/22/15   Carmin Muskrat, MD  valACYclovir (VALTREX) 1000 MG tablet Take as instructed. Patient taking differently: Take 1,000 mg by mouth 2 (two) times daily as needed (cold sore breakouts). Take as instructed. 10/18/13   Roselee Culver, MD   BP 184/125 mmHg  Pulse 77  Temp(Src) 98.5 F (36.9 C) (Oral)  Resp 16  SpO2 96%   Physical Exam  Constitutional: He is oriented to person, place, and time. Vital signs are normal. He appears well-developed and well-nourished.  HENT:  Head: Normocephalic and atraumatic.  Eyes: EOM are normal. Pupils are equal, round, and reactive to light.  Neck: Normal range of motion. Neck supple. No tracheal deviation present.  Cardiovascular: Normal rate, regular rhythm, normal heart sounds and intact distal pulses.   No murmur heard. Pulmonary/Chest: Effort normal and breath sounds normal. No respiratory distress. He has no wheezes. He has no rales. He exhibits no tenderness.  Abdominal: Soft. There is no tenderness.  Musculoskeletal: Normal range of motion.  Neurological: He is alert and oriented to person, place, and time.  Skin: Skin is warm and dry.  Psychiatric: He has a normal mood and affect. His behavior is normal. Thought content normal.  Nursing note and vitals reviewed.  ED Course  Procedures (including critical care time) Labs Review Labs Reviewed - No data to display  Imaging Review No results found. I have personally reviewed and evaluated these images and lab results as part of my medical decision-making.  EKG Interpretation None      MDM  I have reviewed the relevant previous healthcare records. I obtained HPI from historian. Patient discussed with supervising physician  ED Course:  Assessment: Pt is a 89yM with hx drug abuse who presents for heroin detox. On exam, pt in NAD. Nontoxic/nonseptic appearing. VSS. Afebrile. Lungs CTA. Heart RRR. Abdomen nontender soft. Given clonidine in ED. Pt medically cleared for  detox facility. Pt not actively withdrawing in ED no tachycardia. No emesis. Plan is to DC home with paperwork for detox facilities. Given Rx clonidine. At time of discharge, Patient is in no acute distress. Vital Signs are stable. Patient is able to ambulate. Patient able to tolerate PO.    Disposition/Plan:  DC Home Additional Verbal discharge instructions given and discussed with patient.  Pt Instructed to f/u with PCP in the next week for evaluation and treatment of symptoms. Return precautions given Pt acknowledges and agrees with plan  Supervising Physician Charlesetta Shanks, MD   Final diagnoses:  Substance abuse     Shary Decamp, PA-C 01/05/16 1821  Charlesetta Shanks, MD 01/18/16 [phone]

## 2016-01-05 NOTE — ED Notes (Signed)
Patient here for detox off of heroin and cocaine.  Patient states that he has chills, nausea, headache and pain all over.  He last had heroin and cocaine yesterday.

## 2016-01-05 NOTE — ED Notes (Signed)
Patient called for triage, family member states patient "stepped out to the car."

## 2016-01-05 NOTE — Discharge Instructions (Signed)
Community Resource Guide Inpatient Behavioral Health/Residential  °Substance Abuse Treatment °Adults °The [employer]’s “211” is a great source of information about community services available.  Access by dialing 2-1-1 from anywhere in , or by website -  www.nc211.org.  ° °(Updated 08/2015) ° °Crisis Assistance °24 hours a day °  °Services Offered ° °  °Area Served  °[facility] • 24-hour crisis assistance: 800-939-5911 [place], [place]  ° [facility] • 24-hour crisis assistance:336-342-8316 [place]  °[facility] ° • 24-hour crisis assistance: 336-676-6840 [place], [place] °  °[facility] Care [facility] • 24-hour crisis assistance; 800-256-2452 All °  °[facility] • 24-hour crisis response line: 877-626-1772 All  ° °Other Local Resources (Updated 08/2015) ° °Inpatient Behavioral Health/Residential Substance Abuse Treatment Programs °  °Services  ° ° °  °Address and Phone Number  °[facility] ° • 14-day residential rehabilitation  919-575-7928 °[address] °[place], [place]  °[facility])  ° • Detox - private pay only °• 14-day residential rehabilitation -  Medicaid, insurance, private pay only 336-784-9470, or °877-615-2722 °[address], [place], [place] [address]   °[facility] • Private Insurance only °• Multiple facilities [phone] admissions °  °[facility]) ° • 90-day program °• Must be homeless to participate ° 336-725-8389, or °800-758-6077 °[place], [place]  °[facility] ° ° ° • Private Insurance only 828-575-2701, or  °844-684-9200 °[address] °[place], [place] [address]  °[facility] ° ° ° • Must make an appointment °• Transportation is offered from [employer] on [address] °• Accepts private pay, Medicare, [place] Medicaid [phone]  °[address] [place], [place] [address]   °[facility] • Females only °• Associated with [facility] [place] [facility] 704-333-HOPE (4673) °[address] °[place], [place] [address]  °[facility] ° • Private insurance only 336-621-3381, or °800-659-3381 °[address] °[place], [place]  °[facility] ° ° • Detox °• Residential rehabilitation °• Private insurance only °• Multiple locations [phone] admissions  °[facility] [place] ° ° • Private pay °• Private insurance [phone] °[address] °[place] [address]  °[facility] ° ° • Males only °• Fee required at time of admission [phone] °[address] °[place], [place] [address]  °[facility] ° ° • Private pay only ° [phone] °[address] °[place], [place]  °[facility]  ° • Detox - private pay, Medicaid °• Residential rehabilitation for males  - Medicare, Medicaid, insurance, private pay 336-227-7417 °[address] °, [place]   °[facility]  ° • Walk-in interviews Monday - Saturday from 8 am - 4 pm °• Individuals with legal charges are not eligible 919-419-1059 °[address] °[place], [place] [address]  °[facility]  • Must be willing to work °• Must attend Alcoholics Anonymous meetings 336-285-9073 °[address] °[place], [place]   °[place] [facility]  ° • Faith-based program °• Private pay only 336-725-1848 °[address] °[place], [place]  ° °

## 2016-01-05 NOTE — ED Notes (Signed)
Pt reports for heroine and cocaine detox, last use yesterday for both. Denies use of any other substances.

## 2017-01-23 ENCOUNTER — Emergency Department (HOSPITAL_COMMUNITY)
Admission: EM | Admit: 2017-01-23 | Discharge: 2017-01-23 | Disposition: A | Discharge status: Home / Self Care | Payer: Self-pay | Attending: Emergency Medicine | Admitting: Emergency Medicine

## 2017-01-23 ENCOUNTER — Emergency Department (HOSPITAL_COMMUNITY): Payer: Self-pay

## 2017-01-23 ENCOUNTER — Encounter (HOSPITAL_COMMUNITY): Payer: Self-pay | Admitting: Emergency Medicine

## 2017-01-23 DIAGNOSIS — I10 Essential (primary) hypertension: Secondary | ICD-10-CM | POA: Insufficient documentation

## 2017-01-23 DIAGNOSIS — Z79899 Other long term (current) drug therapy: Secondary | ICD-10-CM | POA: Insufficient documentation

## 2017-01-23 DIAGNOSIS — J9801 Acute bronchospasm: Secondary | ICD-10-CM | POA: Insufficient documentation

## 2017-01-23 LAB — COMPREHENSIVE METABOLIC PANEL
ALBUMIN: 4.5 g/dL (ref 3.5–5.0)
ALK PHOS: 89 U/L (ref 38–126)
ALT: 32 U/L (ref 17–63)
ANION GAP: 7 (ref 5–15)
AST: 24 U/L (ref 15–41)
BILIRUBIN TOTAL: 0.9 mg/dL (ref 0.3–1.2)
BUN: 17 mg/dL (ref 6–20)
CALCIUM: 9 mg/dL (ref 8.9–10.3)
CO2: 27 mmol/L (ref 22–32)
CREATININE: 1.38 mg/dL — AB (ref 0.61–1.24)
Chloride: 106 mmol/L (ref 101–111)
GFR calc Af Amer: >60 mL/min (ref >60)
GFR calc non Af Amer: >60 mL/min (ref >60)
GLUCOSE: 150 mg/dL — AB (ref 65–99)
Potassium: 4.3 mmol/L (ref 3.5–5.1)
Sodium: 140 mmol/L (ref 135–145)
TOTAL PROTEIN: 7.7 g/dL (ref 6.5–8.1)

## 2017-01-23 LAB — CBC WITH DIFFERENTIAL/PLATELET
BASOS PCT: 0 %
Basophils Absolute: 0 10*3/uL (ref 0.0–0.1)
Eosinophils Absolute: 1 10*3/uL — ABNORMAL HIGH (ref 0.0–0.7)
Eosinophils Relative: 15 %
HEMATOCRIT: 43 % (ref 39.0–52.0)
HEMOGLOBIN: 15 g/dL (ref 13.0–17.0)
Lymphocytes Relative: 38 %
Lymphs Abs: 2.6 10*3/uL (ref 0.7–4.0)
MCH: 29.4 pg (ref 26.0–34.0)
MCHC: 34.9 g/dL (ref 30.0–36.0)
MCV: 84.3 fL (ref 78.0–100.0)
MONOS PCT: 8 %
Monocytes Absolute: 0.5 10*3/uL (ref 0.1–1.0)
NEUTROS ABS: 2.6 10*3/uL (ref 1.7–7.7)
NEUTROS PCT: 39 %
Platelets: 306 10*3/uL (ref 150–400)
RBC: 5.1 MIL/uL (ref 4.22–5.81)
RDW: 12.1 % (ref 11.5–15.5)
WBC: 6.8 10*3/uL (ref 4.0–10.5)

## 2017-01-23 MED ORDER — ALBUTEROL SULFATE (2.5 MG/3ML) 0.083% IN NEBU
5.0000 mg | INHALATION_SOLUTION | Freq: Once | RESPIRATORY_TRACT | Status: AC
  Administered 2017-01-23: 5 mg via RESPIRATORY_TRACT
  Filled 2017-01-23: qty 6

## 2017-01-23 MED ORDER — ALBUTEROL SULFATE HFA 108 (90 BASE) MCG/ACT IN AERS
2.0000 | INHALATION_SPRAY | RESPIRATORY_TRACT | Status: DC
  Filled 2017-01-23: qty 6.7

## 2017-01-23 MED ORDER — PREDNISONE 50 MG PO TABS: ORAL_TABLET | ORAL | 0 refills | Status: DC

## 2017-01-23 MED ORDER — PREDNISONE 20 MG PO TABS
60.0000 mg | ORAL_TABLET | Freq: Once | ORAL | Status: DC
Start: 2017-01-23 — End: 2017-01-23

## 2017-01-23 MED ORDER — ALBUTEROL SULFATE HFA 108 (90 BASE) MCG/ACT IN AERS: 1.0000 | INHALATION_SPRAY | Freq: Four times a day (QID) | RESPIRATORY_TRACT | 2 refills | Status: DC | PRN

## 2017-01-23 MED ORDER — PREDNISONE 20 MG PO TABS
60.0000 mg | ORAL_TABLET | Freq: Once | ORAL | Status: AC
  Administered 2017-01-23: 60 mg via ORAL
  Filled 2017-01-23: qty 3

## 2017-01-23 MED ORDER — ALBUTEROL SULFATE (2.5 MG/3ML) 0.083% IN NEBU: 5.0000 mg | INHALATION_SOLUTION | Freq: Once | RESPIRATORY_TRACT | Status: DC

## 2017-01-23 MED ORDER — IPRATROPIUM BROMIDE 0.02 % IN SOLN
0.5000 mg | Freq: Once | RESPIRATORY_TRACT | Status: AC
  Administered 2017-01-23: 0.5 mg via RESPIRATORY_TRACT
  Filled 2017-01-23: qty 2.5

## 2017-01-23 MED ORDER — IPRATROPIUM BROMIDE 0.02 % IN SOLN: 0.5000 mg | Freq: Once | RESPIRATORY_TRACT | Status: DC

## 2017-01-23 NOTE — ED Triage Notes (Signed)
Patient is complaining of wheezing. Patient states he doesn't have insurance to see a doctor. Patient had albuterol inhaler but has ran out.

## 2017-01-23 NOTE — ED Provider Notes (Signed)
[facility] DEPT Provider Note   CSN: [id number] Arrival date & time: 01/23/17  1957     History   Chief Complaint Chief Complaint  Patient presents with  . Wheezing    HPI Terry [name] is a 46 y.o. male.  46 year old male who is here with wheezing times several weeks. Has never been formally diagnosed with asthma but has been self-medicating with an albuterol inhaler. No cough or congestion. No fever or chills. No chest pain. Symptoms are worse at work. Is currently out of his albuterol.      Past Medical History:  Diagnosis Date  . Hyperlipidemia   . Hypertension   . Pneumonia     Patient Active Problem List   Diagnosis Date Noted  . Sinus tachycardia   . Community acquired pneumonia 12/08/2014  . CAP (community acquired pneumonia) 12/08/2014  . HTN (hypertension) 10/11/2012  . Pure hypercholesterolemia 10/11/2012  . Allergic rhinitis 10/11/2012  . Impetigo 10/11/2012  . 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 TRICEPS TENDON REPAIR Left 04/2013       Home Medications    Prior to Admission medications   Medication Sig Start Date End Date Taking? Authorizing Provider  albuterol (PROVENTIL HFA;VENTOLIN HFA) 108 (90 BASE) MCG/ACT inhaler Inhale 2 puffs into the lungs every 6 (six) hours as needed for wheezing or shortness of breath. 07/23/15   Domenic Moras, PA-C  amLODipine (NORVASC) 10 MG tablet Take 1 tablet (10 mg total) by mouth daily. 10/18/13   Roselee Culver, MD  cloNIDine (CATAPRES) 0.2 MG tablet Take 1 tablet (0.2 mg total) by mouth 2 (two) times daily. 01/05/16   Shary Decamp, PA-C  pravastatin (PRAVACHOL) 40 MG tablet Take 1 tablet (40 mg total) by mouth daily. 10/18/13   Roselee Culver, MD  predniSONE (DELTASONE) 20 MG tablet Take 2 tablets (40 mg total) by mouth daily with breakfast. For the next four days 11/22/15   Carmin Muskrat, MD  valACYclovir (VALTREX) 1000 MG tablet Take as  instructed. Patient taking differently: Take 1,000 mg by mouth 2 (two) times daily as needed (cold sore breakouts). Take as instructed. 10/18/13   Roselee Culver, MD    Family History Family History  Problem Relation Age of Onset  . Diabetes Mother   . Diabetes Father     Social History Social History  Substance Use Topics  . Smoking status: Never Smoker  . Smokeless tobacco: Never Used  . Alcohol use Yes     Allergies   Patient has no known allergies.   Review of Systems Review of Systems  All other systems reviewed and are negative.    Physical Exam Updated Vital Signs BP (!) 136/97 (BP Location: Left Arm)   Pulse 75   Temp 97.9 F (36.6 C) (Oral)   Resp 16   Ht 1.727 m (5\' 8" )   Wt 95.3 kg (210 lb)   SpO2 97%   BMI 31.93 kg/m   Physical Exam  Constitutional: He is oriented to person, place, and time. He appears well-developed and well-nourished.  Non-toxic appearance. No distress.  HENT:  Head: Normocephalic and atraumatic.  Eyes: Conjunctivae, EOM and lids are normal. Pupils are equal, round, and reactive to light.  Neck: Normal range of motion. Neck supple. No tracheal deviation present. No thyroid mass present.  Cardiovascular: Normal rate, regular rhythm and normal heart sounds.  Exam reveals no gallop.   No murmur heard. Pulmonary/Chest: Effort normal. No  stridor. No respiratory distress. He has decreased breath sounds in the right lower field and the left lower field. He has wheezes in the right lower field and the left lower field. He has no rhonchi. He has no rales.  Abdominal: Soft. Normal appearance and bowel sounds are normal. He exhibits no distension. There is no tenderness. There is no rebound and no CVA tenderness.  Musculoskeletal: Normal range of motion. He exhibits no edema or tenderness.  Neurological: He is alert and oriented to person, place, and time. He has normal strength. No cranial nerve deficit or sensory deficit. GCS eye  subscore is 4. GCS verbal subscore is 5. GCS motor subscore is 6.  Skin: Skin is warm and dry. No abrasion and no rash noted.  Psychiatric: He has a normal mood and affect. His speech is normal and behavior is normal.  Nursing note and vitals reviewed.    ED Treatments / Results  Labs (all labs ordered are listed, but only abnormal results are displayed) Labs Reviewed  CBC WITH DIFFERENTIAL/PLATELET - Abnormal; Notable for the following:       Result Value   Eosinophils Absolute 1.0 (*)    All other components within normal limits  COMPREHENSIVE METABOLIC PANEL    EKG  EKG Interpretation None       Radiology No results found.  Procedures Procedures (including critical care time)  Medications Ordered in ED Medications  albuterol (PROVENTIL) (2.5 MG/3ML) 0.083% nebulizer solution 5 mg (not administered)  ipratropium (ATROVENT) nebulizer solution 0.5 mg (not administered)  predniSONE (DELTASONE) tablet 60 mg (60 mg Oral Given 01/23/17 2032)     Initial Impression / Assessment and Plan / ED Course  I have reviewed the triage vital signs and the nursing notes.  Pertinent labs & imaging results that were available during my care of the patient were reviewed by me and considered in my medical decision making (see chart for details).     Patient given albuterol with prednisone and feels better. Will be given epidural inhaler to go as well as short course of prednisone.  Final Clinical Impressions(s) / ED Diagnoses   Final diagnoses:  None    New Prescriptions New Prescriptions   No medications on file     Lacretia Leigh, MD 01/23/17 2156

## 2017-04-09 ENCOUNTER — Emergency Department (HOSPITAL_COMMUNITY): Payer: Self-pay

## 2017-04-09 ENCOUNTER — Emergency Department (HOSPITAL_COMMUNITY)
Admission: EM | Admit: 2017-04-09 | Discharge: 2017-04-09 | Disposition: A | Discharge status: Home / Self Care | Payer: Self-pay | Attending: Emergency Medicine | Admitting: Emergency Medicine

## 2017-04-09 ENCOUNTER — Encounter (HOSPITAL_COMMUNITY): Payer: Self-pay

## 2017-04-09 DIAGNOSIS — R0602 Shortness of breath: Secondary | ICD-10-CM | POA: Insufficient documentation

## 2017-04-09 DIAGNOSIS — I10 Essential (primary) hypertension: Secondary | ICD-10-CM | POA: Insufficient documentation

## 2017-04-09 DIAGNOSIS — J45909 Unspecified asthma, uncomplicated: Secondary | ICD-10-CM | POA: Insufficient documentation

## 2017-04-09 HISTORY — DX: Unspecified asthma, uncomplicated: J45.909

## 2017-04-09 MED ORDER — ALBUTEROL SULFATE (2.5 MG/3ML) 0.083% IN NEBU
5.0000 mg | INHALATION_SOLUTION | Freq: Once | RESPIRATORY_TRACT | Status: AC
  Administered 2017-04-09: 5 mg via RESPIRATORY_TRACT
  Filled 2017-04-09: qty 6

## 2017-04-09 MED ORDER — PREDNISONE 20 MG PO TABS
60.0000 mg | ORAL_TABLET | Freq: Once | ORAL | Status: AC
  Administered 2017-04-09: 60 mg via ORAL
  Filled 2017-04-09: qty 3

## 2017-04-09 MED ORDER — PREDNISONE 10 MG (21) PO TBPK: ORAL_TABLET | ORAL | 0 refills | Status: DC

## 2017-04-09 MED ORDER — FLUTICASONE-SALMETEROL 100-50 MCG/DOSE IN AEPB: 1.0000 | INHALATION_SPRAY | Freq: Two times a day (BID) | RESPIRATORY_TRACT | 0 refills | Status: DC

## 2017-04-09 MED ORDER — ALBUTEROL SULFATE HFA 108 (90 BASE) MCG/ACT IN AERS
2.0000 | INHALATION_SPRAY | Freq: Once | RESPIRATORY_TRACT | Status: AC
  Administered 2017-04-09: 2 via RESPIRATORY_TRACT
  Filled 2017-04-09: qty 6.7

## 2017-04-09 NOTE — ED Provider Notes (Signed)
[facility] DEPT Provider Note   CSN: [id number] Arrival date & time: 04/09/17  1930     History   Chief Complaint Chief Complaint  Patient presents with  . Shortness of Breath    HPI Terry [name] is a 46 y.o. male with a history of community-acquired pneumonia who presents to the emergency department with a chief complaint of dyspnea with associated wheezing that worsened this afternoon after he ran out of his albuterol inhaler. He reports that he was seen for the same symptoms back in June, but has been able to control his symptoms by using his albuterol inhaler an average of 6-7 times a day. He reports he was seen for similar symptoms in June and discharged with a course of prednisone, which helped his symptoms for a few weeks before they returned.  He denies fever, chills, chest pain, productive cough. He reports he thinks that his wheezing may be due to his indoor cats. He has never been diagnosed with asthma. No PCP. He reports that he recently started a new job and his health insurance should be kicking in within a couple of months.  No h/o of previous intubations. He was previously hospitalized for CAP, but no previous hospitalizations for wheezing.   The history is provided by the patient. No language interpreter was used.    Past Medical History:  Diagnosis Date  . Asthma   . Hyperlipidemia   . Hypertension   . Pneumonia     Patient Active Problem List   Diagnosis Date Noted  . Sinus tachycardia   . Community acquired pneumonia 12/08/2014  . CAP (community acquired pneumonia) 12/08/2014  . HTN (hypertension) 10/11/2012  . Pure hypercholesterolemia 10/11/2012  . Allergic rhinitis 10/11/2012  . Impetigo 10/11/2012  . 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 TRICEPS TENDON REPAIR Left 04/2013     Home Medications    Prior to Admission medications   Medication Sig Start Date End Date Taking? Authorizing  Provider  albuterol (PROVENTIL HFA;VENTOLIN HFA) 108 (90 BASE) MCG/ACT inhaler Inhale 2 puffs into the lungs every 6 (six) hours as needed for wheezing or shortness of breath. 07/23/15   Domenic Moras, PA-C  albuterol (PROVENTIL HFA;VENTOLIN HFA) 108 [phone] Base) MCG/ACT inhaler Inhale 1-2 puffs into the lungs every 6 (six) hours as needed for wheezing or shortness of breath. 01/23/17   Lacretia Leigh, MD  amLODipine (NORVASC) 10 MG tablet Take 1 tablet (10 mg total) by mouth daily. Patient not taking: Reported on 01/23/2017 10/18/13   Roselee [facility], MD  cloNIDine (CATAPRES) 0.2 MG tablet Take 1 tablet (0.2 mg total) by mouth 2 (two) times daily. Patient not taking: Reported on 01/23/2017 01/05/16   Shary Decamp, PA-C  Fluticasone-Salmeterol (ADVAIR DISKUS) 100-50 MCG/DOSE AEPB Inhale 1 puff into the lungs 2 (two) times daily. 04/09/17   Griffon Herberg A, PA-C  pravastatin (PRAVACHOL) 40 MG tablet Take 1 tablet (40 mg total) by mouth daily. Patient not taking: Reported on 01/23/2017 10/18/13   Roselee [facility], MD  predniSONE (STERAPRED UNI-PAK 21 TAB) 10 MG (21) TBPK tablet Take 6 tabs day 1, 5 tabs day 2, 4 tabs day 3, 3 tabs day 4, 2 tabs day 5, and 1 on day 6 04/09/17   Dennys Traughber A, PA-C  valACYclovir (VALTREX) 1000 MG tablet Take as instructed. Patient not taking: Reported on 01/23/2017 10/18/13   Roselee [facility], MD    Family History Family History  Problem Relation Age of Onset  . Diabetes Mother   . Diabetes Father     Social History Social History  Substance Use Topics  . Smoking status: Never Smoker  . Smokeless tobacco: Never Used  . Alcohol use Yes     Allergies   Patient has no known allergies.   Review of Systems Review of Systems  Constitutional: Negative for activity change, chills and fever.  Respiratory: Positive for shortness of breath and wheezing. Negative for cough.   Cardiovascular: Negative for chest pain.  Gastrointestinal: Negative for abdominal pain.    Musculoskeletal: Negative for back pain.  Skin: Negative for rash.     Physical Exam Updated Vital Signs BP (!) 145/106 (BP Location: Right Arm)   Pulse 71   Temp 98 F (36.7 C) (Oral)   Resp 19   SpO2 96%   Physical Exam  Constitutional: He appears well-developed. No distress.  HENT:  Head: Normocephalic and atraumatic.  Right Ear: External ear normal.  Left Ear: External ear normal.  Nose: Nose normal.  Mouth/Throat: Oropharynx is clear and moist.  Eyes: Conjunctivae are normal.  Neck: Neck supple.  Cardiovascular: Normal rate, regular rhythm and normal heart sounds.  Exam reveals no gallop and no friction rub.   No murmur heard. Pulmonary/Chest: Effort normal and breath sounds normal. No respiratory distress. He has no rales.  Bilateral inspiratory respiratory wheezes in all fields.  Abdominal: Soft. He exhibits no distension.  Neurological: He is alert.  Skin: Skin is warm and dry. He is not diaphoretic.  Psychiatric: His behavior is normal.  Nursing note and vitals reviewed.    ED Treatments / Results  Labs (all labs ordered are listed, but only abnormal results are displayed) Labs Reviewed - No data to display  EKG  EKG Interpretation None       Radiology Dg Chest 2 View  Result Date: 04/09/2017 CLINICAL DATA:  Shortness of breath. Symptoms for weeks, worse tonight. EXAM: CHEST  2 VIEW COMPARISON:  Radiographs 01/23/2017 FINDINGS: The cardiomediastinal contours are normal. Similar minimal central bronchial thickening. Pulmonary vasculature is normal. No consolidation, pleural effusion, or pneumothorax. No acute osseous abnormalities are seen. IMPRESSION: No acute abnormality.  Similar minimal central bronchial thickening. Electronically Signed   By: Jeb Levering M.D.   On: 04/09/2017 20:23    Procedures Procedures (including critical care time)  Medications Ordered in ED Medications  albuterol (PROVENTIL) (2.5 MG/3ML) 0.083% nebulizer solution 5  mg (5 mg Nebulization Given 04/09/17 1950)  albuterol (PROVENTIL HFA;VENTOLIN HFA) 108 (90 Base) MCG/ACT inhaler 2 puff (2 puffs Inhalation Given 04/09/17 2257)  predniSONE (DELTASONE) tablet 60 mg (60 mg Oral Given 04/09/17 2258)     Initial Impression / Assessment and Plan / ED Course  I have reviewed the triage vital signs and the nursing notes.  Pertinent labs & imaging results that were available during my care of the patient were reviewed by me and considered in my medical decision making (see chart for details).     46 year old male sitting with chronic dyspnea and wheezing that worsened this afternoon after he ran out of his albuterol inhaler. SaO2 initially at 96%. Nebulizer treatment given in the ED. No retractions or nasal flaring. Patient at 93-95% on RA while ambulating. Chest x-ray with minimal central bronchial thickening. He continues to have bilateral saturating surgery wheezes in all fields after breathing treatment, but reports his breathing is much improved. Prednisone and albuterol inhaler given in the emergency department. Provided the patient with  a referral to [facility] and wellness for outpatient follow-up until his medical insurance starts with his job in a few months. Prescription given for Advair since that patient has been using his albuterol inhaler 6-7 times daily for the last month and a half. Strict return precautions given. No acute distress. Patient is safe for discharge at this time.   Final Clinical Impressions(s) / ED Diagnoses   Final diagnoses:  SOB (shortness of breath)    New Prescriptions Discharge Medication List as of 04/09/2017 10:50 PM    START taking these medications   Details  Fluticasone-Salmeterol (ADVAIR DISKUS) 100-50 MCG/DOSE AEPB Inhale 1 puff into the lungs 2 (two) times daily., Starting Sat 04/09/2017, Print    predniSONE (STERAPRED UNI-PAK 21 TAB) 10 MG (21) TBPK tablet Take 6 tabs day 1, 5 tabs day 2, 4 tabs day 3, 3 tabs day 4, 2 tabs  day 5, and 1 on day 6, Print         Joanne Gavel, PA-C 04/09/17 2307    Margette Fast, MD 04/10/17 1216

## 2017-04-09 NOTE — Discharge Instructions (Signed)
Call [facility] and wellness to schedule a follow-up appointment. Told them you are following up from the emergency department. They also have a pharmacy at their location. They can serve as your primary care provider until your health insurance at work kicks in.   Please take prednisone as follows: Take 6 tabs day 1 (tomorrow), 5 tabs day 2, 4 tabs day 3, 3 tabs day 4, 2 tabs day 5, and 1 on day 6.  Albuterol came be used with the aerochamber/spacer you have at home for 2 puffs every 4 hours for shortness of breath. This is a rescue medication and should be used when you are already feeling short of breath.  Advair is a preventative medication and should be taken one puff in the morning and one puff at night. This medication should be use EVERY morning and every night. It helps decrease your changes have having wheezing and shortness of breath. You can use the coupon you signed up for to help offset the cost of this medication. If the coupon does not work for some reason, call the [employer] and [place].   If you develop any new or worsening symptoms, including fever, worsening shortness of breath as not does not improve with your inhaler, or chest pain, please return to the emergency department for reevaluation.

## 2017-04-09 NOTE — ED Notes (Signed)
Pt's O2 sat remained between 93-95% on RA during ambulation

## 2017-06-09 ENCOUNTER — Encounter (HOSPITAL_COMMUNITY): Payer: Self-pay | Admitting: Emergency Medicine

## 2017-06-09 ENCOUNTER — Ambulatory Visit (HOSPITAL_COMMUNITY)
Admission: EM | Admit: 2017-06-09 | Discharge: 2017-06-09 | Disposition: A | Discharge status: Home / Self Care | Payer: BLUE CROSS/BLUE SHIELD | Attending: Family Medicine | Admitting: Family Medicine

## 2017-06-09 DIAGNOSIS — R062 Wheezing: Secondary | ICD-10-CM | POA: Diagnosis not present

## 2017-06-09 DIAGNOSIS — R0602 Shortness of breath: Secondary | ICD-10-CM | POA: Diagnosis not present

## 2017-06-09 MED ORDER — ALBUTEROL SULFATE HFA 108 (90 BASE) MCG/ACT IN AERS: 2.0000 | INHALATION_SPRAY | Freq: Four times a day (QID) | RESPIRATORY_TRACT | 2 refills | Status: DC | PRN

## 2017-06-09 MED ORDER — PRAVASTATIN SODIUM 40 MG PO TABS: 40.0000 mg | ORAL_TABLET | Freq: Every day | ORAL | 2 refills | Status: DC

## 2017-06-09 MED ORDER — IPRATROPIUM-ALBUTEROL 0.5-2.5 (3) MG/3ML IN SOLN
3.0000 mL | Freq: Once | RESPIRATORY_TRACT | Status: AC
  Administered 2017-06-09: 3 mL via RESPIRATORY_TRACT

## 2017-06-09 MED ORDER — IPRATROPIUM-ALBUTEROL 0.5-2.5 (3) MG/3ML IN SOLN
RESPIRATORY_TRACT | Status: AC
  Filled 2017-06-09: qty 3

## 2017-06-09 MED ORDER — AMLODIPINE BESYLATE 10 MG PO TABS: 10.0000 mg | ORAL_TABLET | Freq: Every day | ORAL | 2 refills | Status: DC

## 2017-06-09 MED ORDER — FLUTICASONE-SALMETEROL 100-50 MCG/DOSE IN AEPB: 1.0000 | INHALATION_SPRAY | Freq: Two times a day (BID) | RESPIRATORY_TRACT | 0 refills | Status: DC

## 2017-06-09 MED ORDER — PREDNISONE 10 MG (21) PO TBPK: ORAL_TABLET | Freq: Every day | ORAL | 0 refills | Status: DC

## 2017-06-09 NOTE — ED Triage Notes (Signed)
PT reports SOB for a few weeks. His inhaler ran out last night. PT has not been diagnosed with asthma, but has been to hospital for SOB 4-5 times in last 18 months. PT reports he has been using his inhaler frequently. PT has audible wheezing at rest.

## 2017-06-09 NOTE — Discharge Instructions (Signed)
1) Start the Advair 2) Start the prednisone 3) Continue the albuterol inhaler as needed 4) I have refilled your pravastatin and amlodipine 5) You need to get in with a family doctor in 1-2 weeks for follow up. [facility] (info above).

## 2017-06-09 NOTE — ED Provider Notes (Signed)
[facility]    CSN: [id number] Arrival date & time: 06/09/17  1347     History   Chief Complaint Chief Complaint  Patient presents with  . Shortness of Breath    HPI Terry [name] is a 46 y.o. male.   Presenting today for shortness of breath and wheezing. Patient unsure if he has asthma; states that his asthma has not officially been diagnosed but have been experiencing SOB and wheezing for the past several weeks. Patient have been self-managing with albuterol inhaler at home but ran out of the albuterol inhaler last night. Patient have been to the ER several times in the past for the same complaint and was once given Advair but couldn't get medicine to be filled by pharmacy due to lack of insurance coverage on Advair. Patient also have been on a few rounds of prednisone for his SOB but symptoms always returns when he completes the prednisone.   Today, he reports SOB and wheezing. He is now feeling better after DuoNeb treatment given by the nurse. He has no CP or chest tightness. He has no headache or dizziness.   He is also requesting refill for his pravastatin and Amlodipine.   He does not have PCP.   HPI  Past Medical History:  Diagnosis Date  . Asthma   . Hyperlipidemia   . Hypertension   . Pneumonia     Patient Active Problem List   Diagnosis Date Noted  . Sinus tachycardia   . Community acquired pneumonia 12/08/2014  . CAP (community acquired pneumonia) 12/08/2014  . HTN (hypertension) 10/11/2012  . Pure hypercholesterolemia 10/11/2012  . Allergic rhinitis 10/11/2012  . Impetigo 10/11/2012  . 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 TRICEPS TENDON REPAIR Left 04/2013       Home Medications    Prior to Admission medications   Medication Sig Start Date End Date Taking? Authorizing Provider  albuterol (PROVENTIL HFA;VENTOLIN HFA) 108 (90 BASE) MCG/ACT inhaler Inhale 2 puffs into the lungs every 6  (six) hours as needed for wheezing or shortness of breath. 07/23/15  Yes Domenic Moras, PA-C  albuterol (PROVENTIL HFA;VENTOLIN HFA) 108 (90 Base) MCG/ACT inhaler Inhale 1-2 puffs into the lungs every 6 (six) hours as needed for wheezing or shortness of breath. 01/23/17  Yes Lacretia Leigh, MD  amLODipine (NORVASC) 10 MG tablet Take 1 tablet (10 mg total) by mouth daily. Patient not taking: Reported on 01/23/2017 10/18/13   Roselee [facility], MD  cloNIDine (CATAPRES) 0.2 MG tablet Take 1 tablet (0.2 mg total) by mouth 2 (two) times daily. Patient not taking: Reported on 01/23/2017 01/05/16   Shary Decamp, PA-C  Fluticasone-Salmeterol (ADVAIR DISKUS) 100-50 MCG/DOSE AEPB Inhale 1 puff into the lungs 2 (two) times daily. 04/09/17   McDonald, Mia A, PA-C  pravastatin (PRAVACHOL) 40 MG tablet Take 1 tablet (40 mg total) by mouth daily. Patient not taking: Reported on 01/23/2017 10/18/13   Roselee [facility], MD  predniSONE (STERAPRED UNI-PAK 21 TAB) 10 MG (21) TBPK tablet Take 6 tabs day 1, 5 tabs day 2, 4 tabs day 3, 3 tabs day 4, 2 tabs day 5, and 1 on day 6 04/09/17   McDonald, Mia A, PA-C  valACYclovir (VALTREX) 1000 MG tablet Take as instructed. Patient not taking: Reported on 01/23/2017 10/18/13   Roselee [facility], MD    Family History Family History  Problem Relation Age of Onset  . Diabetes Mother   .  Diabetes Father     Social History Social History   Tobacco Use  . Smoking status: Never Smoker  . Smokeless tobacco: Never Used  Substance Use Topics  . Alcohol use: Yes  . Drug use: No     Allergies   Patient has no known allergies.   Review of Systems Review of Systems  Constitutional: Negative for fever.  HENT: Negative for congestion, rhinorrhea, sneezing and sore throat.   Respiratory: Positive for shortness of breath and wheezing. Negative for cough.   Cardiovascular: Negative for chest pain and palpitations.  Gastrointestinal: Negative for abdominal pain and nausea.  Skin:  Negative for rash.  Neurological: Negative for dizziness and headaches.   Physical Exam Triage Vital Signs ED Triage Vitals  Enc Vitals Group     BP 06/09/17 1415 (!) 141/104     Pulse Rate 06/09/17 1415 66     Resp 06/09/17 1415 (!) 26     Temp 06/09/17 1415 98.7 F (37.1 C)     Temp Source 06/09/17 1415 Oral     SpO2 06/09/17 1415 93 %     Weight 06/09/17 1418 210 lb (95.3 kg)     Height --      Head Circumference --      Peak Flow --      Pain Score 06/09/17 1418 0     Pain Loc --      Pain Edu? --      Excl. in [place]? --    No data found.  Updated Vital Signs BP (!) 141/104   Pulse 66   Temp 98.7 F (37.1 C) (Oral)   Resp (!) 26   Wt 210 lb (95.3 kg)   SpO2 93%   BMI 31.93 kg/m   Visual Acuity Right Eye Distance:   Left Eye Distance:   Bilateral Distance:    Right Eye Near:   Left Eye Near:    Bilateral Near:     Physical Exam  Constitutional: He is oriented to person, place, and time. He appears well-developed and well-nourished.  HENT:  Head: Normocephalic.  Eyes: Pupils are equal, round, and reactive to light.  Cardiovascular: Normal rate and normal heart sounds.  No murmur heard. Pulmonary/Chest: Effort normal. No respiratory distress. He has wheezes (after Duoneb (given by nurse)).  Abdominal: Soft. He exhibits no distension. There is no tenderness.  Neurological: He is alert and oriented to person, place, and time.  Nursing note and vitals reviewed.    UC Treatments / Results  Labs (all labs ordered are listed, but only abnormal results are displayed) Labs Reviewed - No data to display  EKG  EKG Interpretation None       Radiology No results found.  Procedures Procedures (including critical care time)  Medications Ordered in UC Medications  ipratropium-albuterol (DUONEB) 0.5-2.5 (3) MG/3ML nebulizer solution 3 mL (3 mLs Nebulization Given 06/09/17 1420)   Initial Impression / Assessment and Plan / UC Course  I have reviewed the  triage vital signs and the nursing notes.  Pertinent labs & imaging results that were available during my care of the patient were reviewed by me and considered in my medical decision making (see chart for details).  Final Clinical Impressions(s) / UC Diagnoses   Final diagnoses:  Wheezing  Shortness of breath   Patient is feeling better after DuoNeb Treatment. He continues to have wheezing but is clinically safe to be discharge home and treated outpatient.   Patient is calling his insurance to  find out which steroid inhaler will cover, unfortunately Advair requires prior authorization. Attempted to call pharmacy, which the pharmacy is not able to provide a list of steroid inhaler that his insurance will cover.   Spoke to [employer]; Advair 118mcg/50mcg is covered without prior authorization; will write RX as such. Patient agreed with this plan.   1) Start Advair 2) Start prednisone 3) Continue the albuterol inhaler PRN 4) Pravastatin and Amlodipine refilled.    ED Discharge Orders    None     Controlled Substance Prescriptions [place] Controlled Substance Registry consulted? Not Applicable   Barry Dienes, NP 06/09/17 1455

## 2017-09-24 ENCOUNTER — Emergency Department (HOSPITAL_COMMUNITY): Payer: BLUE CROSS/BLUE SHIELD

## 2017-09-24 ENCOUNTER — Inpatient Hospital Stay (HOSPITAL_COMMUNITY): Payer: BLUE CROSS/BLUE SHIELD

## 2017-09-24 ENCOUNTER — Inpatient Hospital Stay (HOSPITAL_COMMUNITY)
Admission: EM | Admit: 2017-09-24 | Discharge: 2017-09-26 | DRG: 193 | Disposition: A | Discharge status: Home / Self Care | Payer: BLUE CROSS/BLUE SHIELD | Attending: Internal Medicine | Admitting: Internal Medicine

## 2017-09-24 ENCOUNTER — Encounter (HOSPITAL_COMMUNITY): Payer: Self-pay | Admitting: Emergency Medicine

## 2017-09-24 DIAGNOSIS — R0981 Nasal congestion: Secondary | ICD-10-CM | POA: Diagnosis not present

## 2017-09-24 DIAGNOSIS — J9601 Acute respiratory failure with hypoxia: Secondary | ICD-10-CM | POA: Diagnosis present

## 2017-09-24 DIAGNOSIS — R509 Fever, unspecified: Secondary | ICD-10-CM | POA: Diagnosis not present

## 2017-09-24 DIAGNOSIS — J101 Influenza due to other identified influenza virus with other respiratory manifestations: Secondary | ICD-10-CM | POA: Diagnosis present

## 2017-09-24 DIAGNOSIS — F1121 Opioid dependence, in remission: Secondary | ICD-10-CM | POA: Diagnosis not present

## 2017-09-24 DIAGNOSIS — E785 Hyperlipidemia, unspecified: Secondary | ICD-10-CM | POA: Diagnosis present

## 2017-09-24 DIAGNOSIS — Z833 Family history of diabetes mellitus: Secondary | ICD-10-CM | POA: Diagnosis not present

## 2017-09-24 DIAGNOSIS — I10 Essential (primary) hypertension: Secondary | ICD-10-CM | POA: Diagnosis not present

## 2017-09-24 DIAGNOSIS — J1000 Influenza due to other identified influenza virus with unspecified type of pneumonia: Secondary | ICD-10-CM | POA: Diagnosis not present

## 2017-09-24 DIAGNOSIS — J181 Lobar pneumonia, unspecified organism: Secondary | ICD-10-CM

## 2017-09-24 DIAGNOSIS — Z7951 Long term (current) use of inhaled steroids: Secondary | ICD-10-CM | POA: Diagnosis not present

## 2017-09-24 DIAGNOSIS — J189 Pneumonia, unspecified organism: Secondary | ICD-10-CM | POA: Diagnosis not present

## 2017-09-24 DIAGNOSIS — J45901 Unspecified asthma with (acute) exacerbation: Secondary | ICD-10-CM | POA: Diagnosis not present

## 2017-09-24 DIAGNOSIS — R918 Other nonspecific abnormal finding of lung field: Secondary | ICD-10-CM | POA: Diagnosis not present

## 2017-09-24 DIAGNOSIS — J9811 Atelectasis: Secondary | ICD-10-CM | POA: Diagnosis not present

## 2017-09-24 DIAGNOSIS — R000 Tachycardia, unspecified: Secondary | ICD-10-CM | POA: Diagnosis not present

## 2017-09-24 DIAGNOSIS — J4521 Mild intermittent asthma with (acute) exacerbation: Secondary | ICD-10-CM | POA: Diagnosis present

## 2017-09-24 LAB — BASIC METABOLIC PANEL
Anion gap: 10 (ref 5–15)
BUN: 15 mg/dL (ref 6–20)
CHLORIDE: 97 mmol/L — AB (ref 101–111)
CO2: 28 mmol/L (ref 22–32)
Calcium: 8.7 mg/dL — ABNORMAL LOW (ref 8.9–10.3)
Creatinine, Ser: 1.12 mg/dL (ref 0.61–1.24)
GFR calc Af Amer: >60 mL/min (ref >60)
GFR calc non Af Amer: >60 mL/min (ref >60)
GLUCOSE: 149 mg/dL — AB (ref 65–99)
POTASSIUM: 3.7 mmol/L (ref 3.5–5.1)
Sodium: 135 mmol/L (ref 135–145)

## 2017-09-24 LAB — I-STAT CHEM 8, ED
BUN: 14 mg/dL (ref 6–20)
CHLORIDE: 96 mmol/L — AB (ref 101–111)
Calcium, Ion: 1.13 mmol/L — ABNORMAL LOW (ref 1.15–1.40)
Creatinine, Ser: 1.1 mg/dL (ref 0.61–1.24)
Glucose, Bld: 142 mg/dL — ABNORMAL HIGH (ref 65–99)
HCT: 47 % (ref 39.0–52.0)
Hemoglobin: 16 g/dL (ref 13.0–17.0)
POTASSIUM: 3.7 mmol/L (ref 3.5–5.1)
Sodium: 137 mmol/L (ref 135–145)
TCO2: 30 mmol/L (ref 22–32)

## 2017-09-24 LAB — CBC WITH DIFFERENTIAL/PLATELET
Basophils Absolute: 0 10*3/uL (ref 0.0–0.1)
Basophils Relative: 0 %
Eosinophils Absolute: 0 10*3/uL (ref 0.0–0.7)
Eosinophils Relative: 0 %
HEMATOCRIT: 43.8 % (ref 39.0–52.0)
HEMOGLOBIN: 14.8 g/dL (ref 13.0–17.0)
LYMPHS PCT: 14 %
Lymphs Abs: 1.5 10*3/uL (ref 0.7–4.0)
MCH: 29.2 pg (ref 26.0–34.0)
MCHC: 33.8 g/dL (ref 30.0–36.0)
MCV: 86.4 fL (ref 78.0–100.0)
Monocytes Absolute: 1 10*3/uL (ref 0.1–1.0)
Monocytes Relative: 9 %
NEUTROS PCT: 77 %
Neutro Abs: 8.6 10*3/uL — ABNORMAL HIGH (ref 1.7–7.7)
Platelets: 235 10*3/uL (ref 150–400)
RBC: 5.07 MIL/uL (ref 4.22–5.81)
RDW: 12.1 % (ref 11.5–15.5)
WBC: 11.1 10*3/uL — AB (ref 4.0–10.5)

## 2017-09-24 LAB — I-STAT CG4 LACTIC ACID, ED: Lactic Acid, Venous: 1.15 mmol/L (ref 0.5–1.9)

## 2017-09-24 LAB — INFLUENZA PANEL BY PCR (TYPE A & B)
Influenza A By PCR: POSITIVE — AB
Influenza B By PCR: NEGATIVE

## 2017-09-24 MED ORDER — PRAVASTATIN SODIUM 40 MG PO TABS
40.0000 mg | ORAL_TABLET | Freq: Every day | ORAL | Status: DC
  Administered 2017-09-25: 40 mg via ORAL
  Filled 2017-09-24: qty 1

## 2017-09-24 MED ORDER — CEFTRIAXONE SODIUM 1 G IJ SOLR
1.0000 g | Freq: Once | INTRAMUSCULAR | Status: AC
  Administered 2017-09-24: 1 g via INTRAVENOUS
  Filled 2017-09-24: qty 10

## 2017-09-24 MED ORDER — IOPAMIDOL (ISOVUE-300) INJECTION 61%
INTRAVENOUS | Status: AC
  Filled 2017-09-24: qty 75

## 2017-09-24 MED ORDER — ACETAMINOPHEN 325 MG PO TABS: 650.0000 mg | ORAL_TABLET | Freq: Four times a day (QID) | ORAL | Status: DC | PRN

## 2017-09-24 MED ORDER — IOPAMIDOL (ISOVUE-300) INJECTION 61%
75.0000 mL | Freq: Once | INTRAVENOUS | Status: AC | PRN
  Administered 2017-09-25: 75 mL via INTRAVENOUS

## 2017-09-24 MED ORDER — ONDANSETRON HCL 4 MG PO TABS: 4.0000 mg | ORAL_TABLET | Freq: Four times a day (QID) | ORAL | Status: DC | PRN

## 2017-09-24 MED ORDER — SODIUM CHLORIDE 0.9 % IV SOLN
1.0000 g | INTRAVENOUS | Status: DC
  Administered 2017-09-25: 1 g via INTRAVENOUS
  Filled 2017-09-24: qty 10

## 2017-09-24 MED ORDER — ONDANSETRON HCL 4 MG/2ML IJ SOLN
4.0000 mg | Freq: Four times a day (QID) | INTRAMUSCULAR | Status: DC | PRN
Start: 2017-09-24 — End: 2017-09-26

## 2017-09-24 MED ORDER — ACETAMINOPHEN 650 MG RE SUPP: 650.0000 mg | Freq: Four times a day (QID) | RECTAL | Status: DC | PRN

## 2017-09-24 MED ORDER — OSELTAMIVIR PHOSPHATE 75 MG PO CAPS
75.0000 mg | ORAL_CAPSULE | Freq: Two times a day (BID) | ORAL | Status: DC
  Administered 2017-09-24 – 2017-09-26 (×4): 75 mg via ORAL
  Filled 2017-09-24 (×4): qty 1

## 2017-09-24 MED ORDER — SODIUM CHLORIDE 0.9 % IV SOLN
INTRAVENOUS | Status: AC
  Administered 2017-09-24: 100 mL/h via INTRAVENOUS

## 2017-09-24 MED ORDER — IPRATROPIUM-ALBUTEROL 0.5-2.5 (3) MG/3ML IN SOLN
3.0000 mL | Freq: Once | RESPIRATORY_TRACT | Status: AC
  Administered 2017-09-24: 3 mL via RESPIRATORY_TRACT

## 2017-09-24 MED ORDER — IPRATROPIUM-ALBUTEROL 0.5-2.5 (3) MG/3ML IN SOLN
3.0000 mL | Freq: Three times a day (TID) | RESPIRATORY_TRACT | Status: DC
  Administered 2017-09-25 – 2017-09-26 (×4): 3 mL via RESPIRATORY_TRACT
  Filled 2017-09-24 (×4): qty 3

## 2017-09-24 MED ORDER — PREDNISONE 20 MG PO TABS
40.0000 mg | ORAL_TABLET | Freq: Every day | ORAL | Status: DC
  Administered 2017-09-25: 40 mg via ORAL
  Filled 2017-09-24: qty 2

## 2017-09-24 MED ORDER — ALBUTEROL SULFATE (2.5 MG/3ML) 0.083% IN NEBU: 2.5000 mg | INHALATION_SOLUTION | RESPIRATORY_TRACT | Status: DC | PRN

## 2017-09-24 MED ORDER — AMLODIPINE BESYLATE 10 MG PO TABS
10.0000 mg | ORAL_TABLET | Freq: Every day | ORAL | Status: DC
Start: 2017-09-25 — End: 2017-09-26
  Administered 2017-09-25 – 2017-09-26 (×2): 10 mg via ORAL
  Filled 2017-09-24: qty 2
  Filled 2017-09-24: qty 1

## 2017-09-24 MED ORDER — SODIUM CHLORIDE 0.9 % IV BOLUS (SEPSIS)
1000.0000 mL | Freq: Once | INTRAVENOUS | Status: AC
  Administered 2017-09-24: 1000 mL via INTRAVENOUS

## 2017-09-24 MED ORDER — SODIUM CHLORIDE 0.9 % IV SOLN: 500.0000 mg | INTRAVENOUS | Status: DC

## 2017-09-24 MED ORDER — ALBUTEROL SULFATE (2.5 MG/3ML) 0.083% IN NEBU: 5.0000 mg | INHALATION_SOLUTION | Freq: Once | RESPIRATORY_TRACT | Status: DC

## 2017-09-24 MED ORDER — GUAIFENESIN ER 600 MG PO TB12
600.0000 mg | ORAL_TABLET | Freq: Two times a day (BID) | ORAL | Status: DC
  Administered 2017-09-25 – 2017-09-26 (×3): 600 mg via ORAL
  Filled 2017-09-24 (×3): qty 1

## 2017-09-24 MED ORDER — IPRATROPIUM BROMIDE 0.02 % IN SOLN
0.5000 mg | Freq: Four times a day (QID) | RESPIRATORY_TRACT | Status: DC
  Administered 2017-09-24: 0.5 mg via RESPIRATORY_TRACT
  Filled 2017-09-24: qty 2.5

## 2017-09-24 MED ORDER — AZITHROMYCIN 500 MG IV SOLR
500.0000 mg | Freq: Once | INTRAVENOUS | Status: AC
  Administered 2017-09-24: 500 mg via INTRAVENOUS
  Filled 2017-09-24: qty 500

## 2017-09-24 MED ORDER — HYDROCODONE-ACETAMINOPHEN 5-325 MG PO TABS: 1.0000 | ORAL_TABLET | ORAL | Status: DC | PRN

## 2017-09-24 MED ORDER — ENOXAPARIN SODIUM 40 MG/0.4ML ~~LOC~~ SOLN
40.0000 mg | Freq: Every day | SUBCUTANEOUS | Status: DC
  Administered 2017-09-25 – 2017-09-26 (×2): 40 mg via SUBCUTANEOUS
  Filled 2017-09-24 (×2): qty 0.4

## 2017-09-24 NOTE — ED Provider Notes (Signed)
[facility] DEPT Provider Note   CSN: [id number] Arrival date & time: 09/24/17  1659     History   Chief Complaint Chief Complaint  Patient presents with  . Shortness of Breath    HPI Terry [name] is a 47 y.o. male.  47 yo M with a significant past medical history of bronchospasm not fully diagnosed comes in with a chief complaint of shortness of breath.  Patient has had a viral-like syndrome over the past 3 or 4 days.  Cough congestion fevers chills myalgias.  Has had some productive sputum as well.  He felt that his breathing was getting significantly worse today and so came to the emergency department.  Has a history of an admission for pneumonia in the past.  Feels that this is different than his normal wheezing.  Denies lower extremity edema has had some mild chest pain with coughing.  Denies diarrhea.  Had a sick contact who had a similar illness.   The history is provided by the patient.  Shortness of Breath  This is a new problem. The average episode lasts 4 days. The problem occurs continuously.The current episode started more than 2 days ago. The problem has been rapidly worsening. Associated symptoms include a fever and cough. Pertinent negatives include no headaches, no chest pain, no vomiting, no abdominal pain and no rash. He has tried nothing for the symptoms. He has had prior hospitalizations. He has had prior ED visits. He has had no prior ICU admissions. Associated medical issues include asthma. Associated medical issues do not include heart failure or past MI.    Past Medical History:  Diagnosis Date  . Asthma   . Hyperlipidemia   . Hypertension   . Pneumonia     Patient Active Problem List   Diagnosis Date Noted  . Influenza A 09/24/2017  . Sinus tachycardia   . Community acquired pneumonia 12/08/2014  . CAP (community acquired pneumonia) 12/08/2014  . HTN (hypertension) 10/11/2012  . Pure hypercholesterolemia 10/11/2012  .  Allergic rhinitis 10/11/2012  . Impetigo 10/11/2012  . 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 TRICEPS TENDON REPAIR Left 04/2013       Home Medications    Prior to Admission medications   Medication Sig Start Date End Date Taking? Authorizing Provider  albuterol (PROVENTIL HFA;VENTOLIN HFA) 108 (90 BASE) MCG/ACT inhaler Inhale 2 puffs into the lungs every 6 (six) hours as needed for wheezing or shortness of breath. 07/23/15  Yes Domenic Moras, PA-C  amLODipine (NORVASC) 10 MG tablet Take 1 tablet (10 mg total) by mouth daily. 10/18/13  Yes Roselee Culver, MD  DM-Doxylamine-Acetaminophen (NYQUIL COLD & FLU PO) Take 30 mLs by mouth as needed (cold).   Yes [provider]  Fluticasone-Salmeterol (ADVAIR DISKUS) 100-50 MCG/DOSE AEPB Inhale 1 puff into the lungs 2 (two) times daily. 04/09/17  Yes McDonald, Mia A, PA-C  guaifenesin (ROBITUSSIN) 100 MG/5ML syrup Take 200 mg by mouth 3 (three) times daily as needed for cough.   Yes [provider]  methadone (DOLOPHINE) 10 MG/ML solution    Yes [provider]  pravastatin (PRAVACHOL) 40 MG tablet Take 1 tablet (40 mg total) by mouth daily. 10/18/13  Yes Roselee Culver, MD  albuterol (PROVENTIL HFA;VENTOLIN HFA) 108 (90 Base) MCG/ACT inhaler Inhale 1-2 puffs into the lungs every 6 (six) hours as needed for wheezing or shortness of breath. Patient not taking: Reported on 09/24/2017 01/23/17  Lacretia Leigh, MD  cloNIDine (CATAPRES) 0.2 MG tablet Take 1 tablet (0.2 mg total) by mouth 2 (two) times daily. Patient not taking: Reported on 01/23/2017 01/05/16   Shary Decamp, PA-C  predniSONE (STERAPRED UNI-PAK 21 TAB) 10 MG (21) TBPK tablet Take daily by mouth. Take 6 tabs by mouth daily  for 1 days, then 5 tabs for 1 days, then 4 tabs for 1 days, then 3 tabs for 1days, 2 tabs for 1 days, then 1 tab by mouth daily for 1 days Patient not taking: Reported on 09/24/2017 06/09/17    Barry Dienes, NP  valACYclovir (VALTREX) 1000 MG tablet Take as instructed. Patient not taking: Reported on 01/23/2017 10/18/13   Roselee Culver, MD    Family History Family History  Problem Relation Age of Onset  . Diabetes Mother   . Diabetes Father     Social History Social History   Tobacco Use  . Smoking status: Never Smoker  . Smokeless tobacco: Never Used  Substance Use Topics  . Alcohol use: Yes  . Drug use: Yes    Types: Marijuana     Allergies   Patient has no known allergies.   Review of Systems Review of Systems  Constitutional: Positive for chills and fever.  HENT: Negative for congestion and facial swelling.   Eyes: Negative for discharge and visual disturbance.  Respiratory: Positive for cough and shortness of breath.   Cardiovascular: Negative for chest pain and palpitations.  Gastrointestinal: Negative for abdominal pain, diarrhea and vomiting.  Musculoskeletal: Negative for arthralgias and myalgias.  Skin: Negative for color change and rash.  Neurological: Negative for tremors, syncope and headaches.  Psychiatric/Behavioral: Negative for confusion and dysphoric mood.     Physical Exam Updated Vital Signs BP 129/81 (BP Location: Left Arm)   Pulse 92   Temp 98.3 F (36.8 C) (Oral)   Resp 19   SpO2 96%   Physical Exam  Constitutional: He is oriented to person, place, and time. He appears well-developed and well-nourished.  HENT:  Head: Normocephalic and atraumatic.  Eyes: EOM are normal. Pupils are equal, round, and reactive to light.  Neck: Normal range of motion. Neck supple. No JVD present.  Cardiovascular: Normal rate and regular rhythm. Exam reveals no gallop and no friction rub.  No murmur heard. Pulmonary/Chest: No respiratory distress. He has no wheezes. He has rhonchi in the right lower field.  Abdominal: He exhibits no distension. There is no rebound and no guarding.  Musculoskeletal: Normal range of motion.  Neurological: He  is alert and oriented to person, place, and time.  Skin: No rash noted. No pallor.  Psychiatric: He has a normal mood and affect. His behavior is normal.  Nursing note and vitals reviewed.    ED Treatments / Results  Labs (all labs ordered are listed, but only abnormal results are displayed) Labs Reviewed  CBC WITH DIFFERENTIAL/PLATELET - Abnormal; Notable for the following components:      Result Value   WBC 11.1 (*)    Neutro Abs 8.6 (*)    All other components within normal limits  BASIC METABOLIC PANEL - Abnormal; Notable for the following components:   Chloride 97 (*)    Glucose, Bld 149 (*)    Calcium 8.7 (*)    All other components within normal limits  INFLUENZA PANEL BY PCR (TYPE A & B) - Abnormal; Notable for the following components:   Influenza A By PCR POSITIVE (*)    All other components within normal  limits  I-STAT CHEM 8, ED - Abnormal; Notable for the following components:   Chloride 96 (*)    Glucose, Bld 142 (*)    Calcium, Ion 1.13 (*)    All other components within normal limits  CULTURE, BLOOD (ROUTINE X 2)  CULTURE, BLOOD (ROUTINE X 2)  RESPIRATORY PANEL BY PCR  RAPID URINE DRUG SCREEN, HOSP PERFORMED  I-STAT CG4 LACTIC ACID, ED    EKG  EKG Interpretation  Date/Time:  Saturday September 24 2017 17:15:28 EST Ventricular Rate:  103 PR Interval:    QRS Duration: 71 QT Interval:  374 QTC Calculation: 490 R Axis:   102 Text Interpretation:  Sinus tachycardia Atrial premature complex Right axis deviation Borderline T abnormalities, diffuse leads Borderline prolonged QT interval Baseline wander in lead(s) V5 No significant change since last tracing Confirmed by Deno Etienne [phone]) on 09/24/2017 5:51:12 PM       Radiology Dg Chest 2 View  Result Date: 09/24/2017 CLINICAL DATA:  Dyspnea EXAM: CHEST  2 VIEW COMPARISON:  04/09/2017 chest radiograph. FINDINGS: Low lung volumes. Stable cardiomediastinal silhouette with normal heart size. No pneumothorax.  No pleural effusion. No pulmonary edema. Hazy bibasilar lung opacities. IMPRESSION: Low lung volumes with hazy bibasilar lung opacities, which could represent atelectasis, aspiration or pneumonia. Recommend follow-up PA and lateral post treatment chest radiographs in 4-6 weeks. Electronically Signed   By: Ilona Sorrel M.D.   On: 09/24/2017 18:16    Procedures Procedures (including critical care time)  Medications Ordered in ED Medications  oseltamivir (TAMIFLU) capsule 75 mg (not administered)  ipratropium-albuterol (DUONEB) 0.5-2.5 (3) MG/3ML nebulizer solution 3 mL (3 mLs Nebulization Given 09/24/17 1733)  sodium chloride 0.9 % bolus 1,000 mL (1,000 mLs Intravenous New Bag/Given 09/24/17 1806)  cefTRIAXone (ROCEPHIN) 1 g in sodium chloride 0.9 % 100 mL IVPB (0 g Intravenous Stopped 09/24/17 1835)  azithromycin (ZITHROMAX) 500 mg in sodium chloride 0.9 % 250 mL IVPB (0 mg Intravenous Stopped 09/24/17 1943)     Initial Impression / Assessment and Plan / ED Course  I have reviewed the triage vital signs and the nursing notes.  Pertinent labs & imaging results that were available during my care of the patient were reviewed by me and considered in my medical decision making (see chart for details).     47 yo M with a chief complaint of shortness of breath.  Patient is hypoxic into the 70s on room air.  Requiring 6 L of oxygen to get him into the low 90s.  There are no wheezes noted on exam the patient is somewhat diminished diffusely.  He has kind the right lower lung field.  History is consistent with pneumonia.  Will start on fluids antibiotics obtain a chest x-ray basic labs admit.  CRITICAL CARE Performed by: Cecilio Asper   Total critical care time: 80 minutes  Critical care time was exclusive of separately billable procedures and treating other patients.  Critical care was necessary to treat or prevent imminent or life-threatening deterioration.  Critical care was time spent  personally by me on the following activities: development of treatment plan with patient and/or surrogate as well as nursing, discussions with consultants, evaluation of patient's response to treatment, examination of patient, obtaining history from patient or surrogate, ordering and performing treatments and interventions, ordering and review of laboratory studies, ordering and review of radiographic studies, pulse oximetry and re-evaluation of patient's condition.  The patients results and plan were reviewed and discussed.   Any x-rays performed  were independently reviewed by myself.   Differential diagnosis were considered with the presenting HPI.  Medications  oseltamivir (TAMIFLU) capsule 75 mg (not administered)  ipratropium-albuterol (DUONEB) 0.5-2.5 (3) MG/3ML nebulizer solution 3 mL (3 mLs Nebulization Given 09/24/17 1733)  sodium chloride 0.9 % bolus 1,000 mL (1,000 mLs Intravenous New Bag/Given 09/24/17 1806)  cefTRIAXone (ROCEPHIN) 1 g in sodium chloride 0.9 % 100 mL IVPB (0 g Intravenous Stopped 09/24/17 1835)  azithromycin (ZITHROMAX) 500 mg in sodium chloride 0.9 % 250 mL IVPB (0 mg Intravenous Stopped 09/24/17 1943)    Vitals:   09/24/17 1717 09/24/17 1840  BP: (!) 142/100 129/81  Pulse: (!) 102 92  Resp: 14 19  Temp: 98.3 F (36.8 C)   TempSrc: Oral   SpO2: (!) 80% 96%    Final diagnoses:  Community acquired pneumonia of right lower lobe of lung ([place])  Acute respiratory failure with hypoxia ([place])    Admission/ observation were discussed with the admitting physician, patient and/or family and they are comfortable with the plan.   Final Clinical Impressions(s) / ED Diagnoses   Final diagnoses:  Community acquired pneumonia of right lower lobe of lung ([place])  Acute respiratory failure with hypoxia [facility])    ED Discharge Orders    None       Deno Etienne, DO 09/24/17 2109

## 2017-09-24 NOTE — H&P (Signed)
Terry [name] [id number] DOB: Mar 26, 1971 DOA: 09/24/2017     PCP: Patient, No Pcp Per   Outpatient Specialists: None Patient coming from: home Lives  With family    Chief Complaint: cough, fever  HPI: Terry [name] is a 47 y.o. male with medical history significant of HTN, HLD, questionable asthma    Presented with 4-day history of shortness of breath productive cough reports fevers at home.  Reported body aches.  Significant wheezing and worsening shortness of breath reports sick contacts with similar illness   Has had recurrent presentations shortness of breath requiring repeated nebulizer treatments as well as steroids his wheezing has resolved on steroids but returns when he runs out.  At some point he attempted to be on Advair but could not due to financial reasons. Reports prior admissions for pneumonia but no intubation, no history of coronary artery disease.  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  He endorses marijuana use on a weekly basis but otherwise no illegal substances.  While in ER: Was noted to be hypoxic on room air 79% required up to 6 L    Given DuoNeb and 1 L normal saline bolus Patient states he is feeling much better now  significant initial  Findings: K3.7 hemoglobin 14.8 creatinine 1.12 lactic acid 1.16 WBC 11.1  Influenza A positive    Chest x-ray worrisome for low lung volumes and hazy bibasilar lung opacities  IN ER:  Temp (24hrs), Avg:98.3 F (36.8 C), Min:98.3 F (36.8 C), Max:98.3 F (36.8 C)      on arrival  ED Triage Vitals [09/24/17 1717]  Enc Vitals Group     BP (!) 142/100     Pulse Rate (!) 102     Resp 14     Temp 98.3 F (36.8 C)     Temp Source Oral     SpO2 (!) 80 %     Weight      Height      Head Circumference      Peak Flow      Pain Score      Pain Loc      Pain Edu?      Excl. in [place]?     Latest RR 19 satting 96% HR 92 BP 129/81  Following Medications  were ordered in ER: Medications  azithromycin (ZITHROMAX) 500 mg in sodium chloride 0.9 % 250 mL IVPB (500 mg Intravenous New Bag/Given 09/24/17 1843)  ipratropium-albuterol (DUONEB) 0.5-2.5 (3) MG/3ML nebulizer solution 3 mL (3 mLs Nebulization Given 09/24/17 1733)  sodium chloride 0.9 % bolus 1,000 mL (1,000 mLs Intravenous New Bag/Given 09/24/17 1806)  cefTRIAXone (ROCEPHIN) 1 g in sodium chloride 0.9 % 100 mL IVPB (0 g Intravenous Stopped 09/24/17 1835)      Hospitalist was called for admission for community-acquired pneumonia in the setting of influenza and poorly controlled asthma  Regarding pertinent Chronic problems: Possibly undiagnosed underlying asthma poorly controlled  Review of Systems:    Pertinent positives include:  Fevers, chills, fatigue, shortness of breath at rest.   dyspnea on exertion, productive cough  Constitutional:  No weight loss, night sweats weight loss  HEENT:  No headaches, Difficulty swallowing,Tooth/dental problems,Sore throat,  No sneezing, itching, ear ache, nasal congestion, post nasal drip,  Cardio-vascular:  No chest pain, Orthopnea, PND, anasarca, dizziness, palpitations.no Bilateral lower extremity swelling  GI:  No heartburn, indigestion, abdominal pain, nausea, vomiting, diarrhea, change in bowel habits, loss of  appetite, melena, blood in stool, hematemesis Resp:   No excess mucus, no , No non-productive cough, No coughing up of blood.No change in color of mucus.No wheezing. Skin:  no rash or lesions. No jaundice GU:  no dysuria, change in color of urine, no urgency or frequency. No straining to urinate.  No flank pain.  Musculoskeletal:  No joint pain or no joint swelling. No decreased range of motion. No back pain.  Psych:  No change in mood or affect. No depression or anxiety. No memory loss.  Neuro: no localizing neurological complaints, no tingling, no weakness, no double vision, no gait abnormality, no slurred speech, no  confusion  As per HPI otherwise 10 point review of systems negative.   Past Medical History: Past Medical History:  Diagnosis Date  . Asthma   . Hyperlipidemia   . Hypertension   . Pneumonia    Past Surgical History:  Procedure Laterality Date  . TRICEPS TENDON REPAIR Left 04/2013     Social History:  Ambulatory   independently     reports that  has never smoked. he has never used smokeless tobacco. He reports that he drinks alcohol. He reports that he uses drugs. Drug: Marijuana.  Allergies:  No Known Allergies     Family History:   Family History  Problem Relation Age of Onset  . Diabetes Mother   . Diabetes Father     Medications: Prior to Admission medications   Medication Sig Start Date End Date Taking? Authorizing Provider  albuterol (PROVENTIL HFA;VENTOLIN HFA) 108 (90 BASE) MCG/ACT inhaler Inhale 2 puffs into the lungs every 6 (six) hours as needed for wheezing or shortness of breath. 07/23/15  Yes Domenic Moras, PA-C  amLODipine (NORVASC) 10 MG tablet Take 1 tablet (10 mg total) by mouth daily. 10/18/13  Yes Roselee Culver, MD  DM-Doxylamine-Acetaminophen (NYQUIL COLD & FLU PO) Take 30 mLs by mouth as needed (cold).   Yes [provider]  Fluticasone-Salmeterol (ADVAIR DISKUS) 100-50 MCG/DOSE AEPB Inhale 1 puff into the lungs 2 (two) times daily. 04/09/17  Yes McDonald, Mia A, PA-C  guaifenesin (ROBITUSSIN) 100 MG/5ML syrup Take 200 mg by mouth 3 (three) times daily as needed for cough.   Yes [provider]  pravastatin (PRAVACHOL) 40 MG tablet Take 1 tablet (40 mg total) by mouth daily. 10/18/13  Yes Roselee Culver, MD  albuterol (PROVENTIL HFA;VENTOLIN HFA) 108 (90 Base) MCG/ACT inhaler Inhale 1-2 puffs into the lungs every 6 (six) hours as needed for wheezing or shortness of breath. Patient not taking: Reported on 09/24/2017 01/23/17   Lacretia Leigh, MD  cloNIDine (CATAPRES) 0.2 MG tablet Take 1 tablet (0.2 mg total) by mouth 2 (two)  times daily. Patient not taking: Reported on 01/23/2017 01/05/16   Shary Decamp, PA-C  predniSONE (STERAPRED UNI-PAK 21 TAB) 10 MG (21) TBPK tablet Take daily by mouth. Take 6 tabs by mouth daily  for 1 days, then 5 tabs for 1 days, then 4 tabs for 1 days, then 3 tabs for 1days, 2 tabs for 1 days, then 1 tab by mouth daily for 1 days Patient not taking: Reported on 09/24/2017 06/09/17   Barry Dienes, NP  valACYclovir (VALTREX) 1000 MG tablet Take as instructed. Patient not taking: Reported on 01/23/2017 10/18/13   Roselee Culver, MD    Physical Exam: Patient Vitals for the past 24 hrs:  BP Temp Temp src Pulse Resp SpO2  09/24/17 1840 129/81 - - 92 19 96 %  09/24/17  1717 (!) 142/100 98.3 F (36.8 C) Oral (!) 102 14 (!) 80 %    1. General:  in No Acute distress  well  -appearing 2. Psychological: Alert and   Oriented 3. Head/ENT:   Dry Mucous Membranes                          Head Non traumatic, neck supple                            Poor Dentition 4. SKIN:   decreased Skin turgor,  Skin clean Dry and intact no rash 5. Heart: Regular rate and rhythm no Murmur, no Rub or gallop 6. Lungs:   no wheezes some crackles   7. Abdomen: Soft,  non-tender, Non distended  8. Lower extremities: no clubbing, cyanosis, or edema 9. Neurologically Grossly intact, moving all 4 extremities equally   10. MSK: Normal range of motion   body mass index is unknown because there is no height or weight on file.  Labs on Admission:   Labs on Admission: I have personally reviewed following labs and imaging studies  CBC: Recent Labs  Lab 09/24/17 1746 09/24/17 1758  WBC 11.1*  --   NEUTROABS 8.6*  --   HGB 14.8 16.0  HCT 43.8 47.0  MCV 86.4  --   PLT 235  --    Basic Metabolic Panel: Recent Labs  Lab 09/24/17 1746 09/24/17 1758  NA 135 137  K 3.7 3.7  CL 97* 96*  CO2 28  --   GLUCOSE 149* 142*  BUN 15 14  CREATININE 1.12 1.10  CALCIUM 8.7*  --    GFR: CrCl cannot be calculated (Unknown  ideal weight.). Liver Function Tests: No results for input(s): AST, ALT, ALKPHOS, BILITOT, PROT, ALBUMIN in the last 168 hours. No results for input(s): LIPASE, AMYLASE in the last 168 hours. No results for input(s): AMMONIA in the last 168 hours. Coagulation Profile: No results for input(s): INR, PROTIME in the last 168 hours. Cardiac Enzymes: No results for input(s): CKTOTAL, CKMB, CKMBINDEX, TROPONINI in the last 168 hours. BNP (last 3 results) No results for input(s): PROBNP in the last 8760 hours. HbA1C: No results for input(s): HGBA1C in the last 72 hours. CBG: No results for input(s): GLUCAP in the last 168 hours. Lipid Profile: No results for input(s): CHOL, HDL, LDLCALC, TRIG, CHOLHDL, LDLDIRECT in the last 72 hours. Thyroid Function Tests: No results for input(s): TSH, T4TOTAL, FREET4, T3FREE, THYROIDAB in the last 72 hours. Anemia Panel: No results for input(s): VITAMINB12, FOLATE, FERRITIN, TIBC, IRON, RETICCTPCT in the last 72 hours. Urine analysis: Sepsis Labs: @LABRCNTIP (procalcitonin:4,lacticidven:4) )No results found for this or any previous visit (from the past 240 hour(s)).    UA not ordered  No results found for: HGBA1C  CrCl cannot be calculated (Unknown ideal weight.).  BNP (last 3 results) No results for input(s): PROBNP in the last 8760 hours.   ECG REPORT  Independently reviewed Rate: 103  Rhythm: sinus tachycardia ST&T Change: No acute ischemic changes   QTC 490  There were no vitals filed for this visit.   Cultures:    Component Value Date/Time   SDES TRACHEAL SITE 12/10/2014 1432   SPECREQUEST NONE 12/10/2014 1432   CULT NO GROWTH Performed at [facility]  12/10/2014 1432   REPTSTATUS 12/11/2014 FINAL 12/10/2014 1432     Radiological Exams on Admission: Dg Chest 2  View  Result Date: 09/24/2017 CLINICAL DATA:  Dyspnea EXAM: CHEST  2 VIEW COMPARISON:  04/09/2017 chest radiograph. FINDINGS: Low lung volumes. Stable  cardiomediastinal silhouette with normal heart size. No pneumothorax. No pleural effusion. No pulmonary edema. Hazy bibasilar lung opacities. IMPRESSION: Low lung volumes with hazy bibasilar lung opacities, which could represent atelectasis, aspiration or pneumonia. Recommend follow-up PA and lateral post treatment chest radiographs in 4-6 weeks. Electronically Signed   By: Ilona Sorrel M.D.   On: 09/24/2017 18:16    Chart has been reviewed    Assessment/Plan  47 y.o. male with medical history significant of HTN, HLD, questionable asthma    Admitted for  community-acquired pneumonia in the setting of influenza and poorly controlled asthma  Present on Admission: . Community acquired pneumonia - in the setting of influenza , initiate Tamiflu await results of blood and sputum cultures for now continue antibiotics given and cannot rule out secondary pneumonia . HTN (hypertension) -stable resume home medications when able to tolerate . Influenza A -initiate Tamiflu, continue droplet precautions Asthma exacerbation -patient with chronic respiratory complaints of recurrent wheezing episodes steroid dependent.  We have prior history of pneumonia. Given recent significant wheezing will add steroids. Would benefit from outpatient PFTs Order CT of the chest to better evaluate for parenchymal disease given recurrent pneumonia.  If CT of the chest abnormal would benefit from pulmonology consult Check HIV status  History of methadone use -confirming methadone clinic dose and will restart once confirmed. Other plan as per orders.  DVT prophylaxis:   Lovenox     Code Status:  FULL CODE  as per patient    Family Communication:   Family  at  Bedside  plan of care was discussed with    Wife,   Disposition Plan:     To home once workup is complete and patient is stable                             Consults called: none   Admission status:    inpatient     Level of care  tele       I have spent a  total of 47min on this admission Shams Fill 09/24/2017, 9:17 PM    [facility]  Pager [phone]   after 2 AM please page floor coverage PA If 7AM-7PM, please contact the day team taking care of the patient  [email or web address]  Password [email or web address]

## 2017-09-24 NOTE — ED Notes (Signed)
Pt aware urine sample is needed 

## 2017-09-24 NOTE — ED Triage Notes (Signed)
Patient reports SOB and cough that is productive yesterday.  Patient O2 on room air was 79, put on 2L O2 via n/c came up to 84%, patient on 4L up to 86.

## 2017-09-24 NOTE — ED Notes (Addendum)
Pt given ginger ale and updated that we are waiting on a bed upstairs and we may not get one until the morning. Pt understands.

## 2017-09-25 ENCOUNTER — Other Ambulatory Visit: Payer: Self-pay

## 2017-09-25 DIAGNOSIS — J101 Influenza due to other identified influenza virus with other respiratory manifestations: Secondary | ICD-10-CM

## 2017-09-25 DIAGNOSIS — J9601 Acute respiratory failure with hypoxia: Secondary | ICD-10-CM

## 2017-09-25 DIAGNOSIS — J189 Pneumonia, unspecified organism: Secondary | ICD-10-CM

## 2017-09-25 DIAGNOSIS — J45901 Unspecified asthma with (acute) exacerbation: Secondary | ICD-10-CM

## 2017-09-25 LAB — MAGNESIUM: MAGNESIUM: 2.2 mg/dL (ref 1.7–2.4)

## 2017-09-25 LAB — COMPREHENSIVE METABOLIC PANEL
ALK PHOS: 66 U/L (ref 38–126)
ALT: 31 U/L (ref 17–63)
AST: 30 U/L (ref 15–41)
Albumin: 3.3 g/dL — ABNORMAL LOW (ref 3.5–5.0)
Anion gap: 12 (ref 5–15)
BILIRUBIN TOTAL: 0.6 mg/dL (ref 0.3–1.2)
BUN: 13 mg/dL (ref 6–20)
CALCIUM: 8.2 mg/dL — AB (ref 8.9–10.3)
CO2: 25 mmol/L (ref 22–32)
Chloride: 100 mmol/L — ABNORMAL LOW (ref 101–111)
Creatinine, Ser: 0.99 mg/dL (ref 0.61–1.24)
GFR calc non Af Amer: >60 mL/min (ref >60)
Glucose, Bld: 203 mg/dL — ABNORMAL HIGH (ref 65–99)
Potassium: 3.7 mmol/L (ref 3.5–5.1)
SODIUM: 137 mmol/L (ref 135–145)
TOTAL PROTEIN: 6.6 g/dL (ref 6.5–8.1)

## 2017-09-25 LAB — RESPIRATORY PANEL BY PCR
Adenovirus: NOT DETECTED
BORDETELLA PERTUSSIS-RVPCR: NOT DETECTED
CHLAMYDOPHILA PNEUMONIAE-RVPPCR: NOT DETECTED
Coronavirus 229E: NOT DETECTED
Coronavirus HKU1: NOT DETECTED
Coronavirus NL63: NOT DETECTED
Coronavirus OC43: NOT DETECTED
INFLUENZA A-RVPPCR: NOT DETECTED
Influenza B: NOT DETECTED
Metapneumovirus: NOT DETECTED
Mycoplasma pneumoniae: NOT DETECTED
PARAINFLUENZA VIRUS 3-RVPPCR: NOT DETECTED
Parainfluenza Virus 1: NOT DETECTED
Parainfluenza Virus 2: NOT DETECTED
Parainfluenza Virus 4: NOT DETECTED
RHINOVIRUS / ENTEROVIRUS - RVPPCR: NOT DETECTED
Respiratory Syncytial Virus: NOT DETECTED

## 2017-09-25 LAB — CBC
HCT: 40.8 % (ref 39.0–52.0)
Hemoglobin: 13.1 g/dL (ref 13.0–17.0)
MCH: 28.2 pg (ref 26.0–34.0)
MCHC: 32.1 g/dL (ref 30.0–36.0)
MCV: 87.7 fL (ref 78.0–100.0)
Platelets: 227 10*3/uL (ref 150–400)
RBC: 4.65 MIL/uL (ref 4.22–5.81)
RDW: 12.3 % (ref 11.5–15.5)
WBC: 6.9 10*3/uL (ref 4.0–10.5)

## 2017-09-25 LAB — STREP PNEUMONIAE URINARY ANTIGEN: STREP PNEUMO URINARY ANTIGEN: NEGATIVE

## 2017-09-25 LAB — TSH: TSH: 1.013 u[IU]/mL (ref 0.350–4.500)

## 2017-09-25 LAB — RAPID URINE DRUG SCREEN, HOSP PERFORMED
AMPHETAMINES: NOT DETECTED
BENZODIAZEPINES: NOT DETECTED
Barbiturates: NOT DETECTED
Cocaine: POSITIVE — AB
OPIATES: NOT DETECTED
Tetrahydrocannabinol: POSITIVE — AB

## 2017-09-25 LAB — PHOSPHORUS: Phosphorus: 1.5 mg/dL — ABNORMAL LOW (ref 2.5–4.6)

## 2017-09-25 MED ORDER — METHADONE HCL 10 MG/ML PO CONC
85.0000 mg | Freq: Every day | ORAL | Status: DC
  Administered 2017-09-25 – 2017-09-26 (×2): 85 mg via ORAL
  Filled 2017-09-25 (×2): qty 8.5

## 2017-09-25 MED ORDER — K PHOS MONO-SOD PHOS DI & MONO 155-852-130 MG PO TABS
250.0000 mg | ORAL_TABLET | Freq: Two times a day (BID) | ORAL | Status: AC
  Administered 2017-09-25 – 2017-09-26 (×2): 250 mg via ORAL
  Filled 2017-09-25 (×2): qty 1

## 2017-09-25 MED ORDER — AZITHROMYCIN 250 MG PO TABS: 500.0000 mg | ORAL_TABLET | Freq: Every day | ORAL | Status: DC

## 2017-09-25 MED ORDER — ZOLPIDEM TARTRATE 5 MG PO TABS
5.0000 mg | ORAL_TABLET | Freq: Once | ORAL | Status: AC
Start: 2017-09-25 — End: 2017-09-25
  Administered 2017-09-25: 5 mg via ORAL
  Filled 2017-09-25: qty 1

## 2017-09-25 MED ORDER — PREDNISONE 50 MG PO TABS
60.0000 mg | ORAL_TABLET | Freq: Two times a day (BID) | ORAL | Status: DC
  Administered 2017-09-25 – 2017-09-26 (×2): 60 mg via ORAL
  Filled 2017-09-25 (×4): qty 1

## 2017-09-25 NOTE — Plan of Care (Signed)
  Clinical Measurements: Respiratory complications will improve 09/25/2017 2158 - Progressing by Ashley Murrain, RN

## 2017-09-25 NOTE — ED Notes (Signed)
ED TO INPATIENT HANDOFF REPORT  Name/Age/Gender Theresa Duty 47 y.o. male  Code Status    Code Status Orders  (From admission, onward)        Start     Ordered   09/24/17 2259  Full code  Continuous     09/24/17 2258    Code Status History    Date Active Date Inactive Code Status Order ID Comments User Context   12/08/2014 23:59 12/11/2014 16:30 Full Code [id number]  Terry Millard, MD Inpatient      Home/SNF/Other Home  Chief Complaint Shortness Of Breath   Level of Care/Admitting Diagnosis ED Disposition    ED Disposition Condition [facility] Area: [facility] [[id number]]  Level of Care: Telemetry [5]  Admit to tele based on following criteria: Other see comments  Admit to tele based on following criteria: Monitor for Ischemic changes  Comments: shortness of breath.  Diagnosis: CAP (community acquired pneumonia) [233007]  Admitting Physician: Toy Baker [[id number]]  Attending Physician: Toy Baker [[id number]]  Estimated length of stay: 3 - 4 days  Certification:: I certify this patient will need inpatient services for at least 2 midnights  PT Class (Do Not Modify): Inpatient [101]  PT Acc Code (Do Not Modify): Private [1]       Medical History Past Medical History:  Diagnosis Date  . Asthma   . Hyperlipidemia   . Hypertension   . Pneumonia     Allergies No Known Allergies  IV Location/Drains/Wounds Patient Lines/Drains/Airways Status   Active Line/Drains/Airways    None          Labs/Imaging Results for orders placed or performed during the hospital encounter of 09/24/17 (from the past 48 hour(s))  CBC with Differential     Status: Abnormal   Collection Time: 09/24/17  5:46 PM  Result Value Ref Range   WBC 11.1 (H) 4.0 - 10.5 K/uL   RBC 5.07 4.22 - 5.81 MIL/uL   Hemoglobin 14.8 13.0 - 17.0 g/dL   HCT 43.8 39.0 - 52.0 %   MCV 86.4 78.0 - 100.0 fL   MCH 29.2 26.0 - 34.0 pg   MCHC 33.8 30.0 - 36.0  g/dL   RDW 12.1 11.5 - 15.5 %   Platelets 235 150 - 400 K/uL   Neutrophils Relative % 77 %   Neutro Abs 8.6 (H) 1.7 - 7.7 K/uL   Lymphocytes Relative 14 %   Lymphs Abs 1.5 0.7 - 4.0 K/uL   Monocytes Relative 9 %   Monocytes Absolute 1.0 0.1 - 1.0 K/uL   Eosinophils Relative 0 %   Eosinophils Absolute 0.0 0.0 - 0.7 K/uL   Basophils Relative 0 %   Basophils Absolute 0.0 0.0 - 0.1 K/uL    Comment: Performed at [facility], [place] [address], [place], [place] [address]  Basic metabolic panel     Status: Abnormal   Collection Time: 09/24/17  5:46 PM  Result Value Ref Range   Sodium 135 135 - 145 mmol/L   Potassium 3.7 3.5 - 5.1 mmol/L   Chloride 97 (L) 101 - 111 mmol/L   CO2 28 22 - 32 mmol/L   Glucose, Bld 149 (H) 65 - 99 mg/dL   BUN 15 6 - 20 mg/dL   Creatinine, Ser 1.12 0.61 - 1.24 mg/dL   Calcium 8.7 (L) 8.9 - 10.3 mg/dL   GFR calc non Af Amer >60 >60 mL/min   GFR calc Af Amer >60 >60  mL/min    Comment: (NOTE) The eGFR has been calculated using the CKD EPI equation. This calculation has not been validated in all clinical situations. eGFR's persistently <60 mL/min signify possible Chronic Kidney Disease.    Anion gap 10 5 - 15    Comment: Performed at [facility], [place] [address], [place], [place] [address]  Influenza panel by PCR (type A & B)     Status: Abnormal   Collection Time: 09/24/17  5:46 PM  Result Value Ref Range   Influenza A By PCR POSITIVE (A) NEGATIVE   Influenza B By PCR NEGATIVE NEGATIVE    Comment: (NOTE) The Xpert Xpress Flu assay is intended as an aid in the diagnosis of  influenza and should not be used as a sole basis for treatment.  This  assay is FDA approved for nasopharyngeal swab specimens only. Nasal  washings and aspirates are unacceptable for Xpert Xpress Flu testing. Performed at [facility], [place] [address], [place], [place] [address]   I-Stat Chem 8, ED     Status: Abnormal    Collection Time: 09/24/17  5:58 PM  Result Value Ref Range   Sodium 137 135 - 145 mmol/L   Potassium 3.7 3.5 - 5.1 mmol/L   Chloride 96 (L) 101 - 111 mmol/L   BUN 14 6 - 20 mg/dL   Creatinine, Ser 1.10 0.61 - 1.24 mg/dL   Glucose, Bld 142 (H) 65 - 99 mg/dL   Calcium, Ion 1.13 (L) 1.15 - 1.40 mmol/L   TCO2 30 22 - 32 mmol/L   Hemoglobin 16.0 13.0 - 17.0 g/dL   HCT 47.0 39.0 - 52.0 %  I-Stat CG4 Lactic Acid, ED     Status: None   Collection Time: 09/24/17  5:58 PM  Result Value Ref Range   Lactic Acid, Venous 1.15 0.5 - 1.9 mmol/L  Respiratory Panel by PCR     Status: None   Collection Time: 09/24/17  8:17 PM  Result Value Ref Range   Adenovirus NOT DETECTED NOT DETECTED   Coronavirus 229E NOT DETECTED NOT DETECTED   Coronavirus HKU1 NOT DETECTED NOT DETECTED   Coronavirus NL63 NOT DETECTED NOT DETECTED   Coronavirus OC43 NOT DETECTED NOT DETECTED   Metapneumovirus NOT DETECTED NOT DETECTED   Rhinovirus / Enterovirus NOT DETECTED NOT DETECTED   Influenza A NOT DETECTED NOT DETECTED   Influenza B NOT DETECTED NOT DETECTED   Parainfluenza Virus 1 NOT DETECTED NOT DETECTED   Parainfluenza Virus 2 NOT DETECTED NOT DETECTED   Parainfluenza Virus 3 NOT DETECTED NOT DETECTED   Parainfluenza Virus 4 NOT DETECTED NOT DETECTED   Respiratory Syncytial Virus NOT DETECTED NOT DETECTED   Bordetella pertussis NOT DETECTED NOT DETECTED   Chlamydophila pneumoniae NOT DETECTED NOT DETECTED   Mycoplasma pneumoniae NOT DETECTED NOT DETECTED    Comment: Performed at [facility], [address], [place], [place] [address]  Urine rapid drug screen (hosp performed)     Status: Abnormal   Collection Time: 09/25/17  8:20 AM  Result Value Ref Range   Opiates NONE DETECTED NONE DETECTED   Cocaine POSITIVE (A) NONE DETECTED   Benzodiazepines NONE DETECTED NONE DETECTED   Amphetamines NONE DETECTED NONE DETECTED   Tetrahydrocannabinol POSITIVE (A) NONE DETECTED   Barbiturates NONE DETECTED NONE  DETECTED    Comment: (NOTE) DRUG SCREEN FOR MEDICAL PURPOSES ONLY.  IF CONFIRMATION IS NEEDED FOR ANY PURPOSE, NOTIFY LAB WITHIN 5 DAYS. LOWEST DETECTABLE LIMITS FOR  URINE DRUG SCREEN Drug Class                     Cutoff (ng/mL) Amphetamine and metabolites    1000 Barbiturate and metabolites    200 Benzodiazepine                 267 Tricyclics and metabolites     300 Opiates and metabolites        300 Cocaine and metabolites        300 THC                            50 Performed at [place] [address] [place], [place] [address]   Magnesium     Status: None   Collection Time: 09/25/17 10:32 AM  Result Value Ref Range   Magnesium 2.2 1.7 - 2.4 mg/dL    Comment: Performed at [facility], [place] [address] [place], [place] [address]  Phosphorus     Status: Abnormal   Collection Time: 09/25/17 10:32 AM  Result Value Ref Range   Phosphorus 1.5 (L) 2.5 - 4.6 mg/dL    Comment: Performed at [facility], [place] [address], [place], [place] [address]  Comprehensive metabolic panel     Status: Abnormal   Collection Time: 09/25/17 10:32 AM  Result Value Ref Range   Sodium 137 135 - 145 mmol/L   Potassium 3.7 3.5 - 5.1 mmol/L   Chloride 100 (L) 101 - 111 mmol/L   CO2 25 22 - 32 mmol/L   Glucose, Bld 203 (H) 65 - 99 mg/dL   BUN 13 6 - 20 mg/dL   Creatinine, Ser 0.99 0.61 - 1.24 mg/dL   Calcium 8.2 (L) 8.9 - 10.3 mg/dL   Total Protein 6.6 6.5 - 8.1 g/dL   Albumin 3.3 (L) 3.5 - 5.0 g/dL   AST 30 15 - 41 U/L   ALT 31 17 - 63 U/L   Alkaline Phosphatase 66 38 - 126 U/L   Total Bilirubin 0.6 0.3 - 1.2 mg/dL   GFR calc non Af Amer >60 >60 mL/min   GFR calc Af Amer >60 >60 mL/min    Comment: (NOTE) The eGFR has been calculated using the CKD EPI equation. This calculation has not been validated in all clinical situations. eGFR's persistently <60 mL/min signify possible Chronic Kidney Disease.    Anion gap 12 5 - 15     Comment: Performed at [facility],  [place] [address] [place], [place] [address]  CBC     Status: None   Collection Time: 09/25/17 10:32 AM  Result Value Ref Range   WBC 6.9 4.0 - 10.5 K/uL   RBC 4.65 4.22 - 5.81 MIL/uL   Hemoglobin 13.1 13.0 - 17.0 g/dL   HCT 40.8 39.0 - 52.0 %   MCV 87.7 78.0 - 100.0 fL   MCH 28.2 26.0 - 34.0 pg   MCHC 32.1 30.0 - 36.0 g/dL   RDW 12.3 11.5 - 15.5 %   Platelets 227 150 - 400 K/uL    Comment: Performed at [facility], [place] [address], [place], [place] [address]   Dg Chest 2 View  Result Date: 09/24/2017 CLINICAL DATA:  Dyspnea EXAM: CHEST  2 VIEW COMPARISON:  04/09/2017 chest radiograph. FINDINGS: Low lung volumes. Stable cardiomediastinal silhouette with normal heart size. No pneumothorax. No pleural effusion. No pulmonary edema. Hazy bibasilar  lung opacities. IMPRESSION: Low lung volumes with hazy bibasilar lung opacities, which could represent atelectasis, aspiration or pneumonia. Recommend follow-up PA and lateral post treatment chest radiographs in 4-6 weeks. Electronically Signed   By: Ilona Sorrel M.D.   On: 09/24/2017 18:16   Ct Chest W Contrast  Result Date: 09/25/2017 CLINICAL DATA:  Productive cough, shortness of breath and fevers for 4 days. Body aches. Significant wheezing. EXAM: CT CHEST WITH CONTRAST TECHNIQUE: Multidetector CT imaging of the chest was performed during intravenous contrast administration. CONTRAST:  64m ISOVUE-300 IOPAMIDOL (ISOVUE-300) INJECTION 61% COMPARISON:  Chest radiograph September 24, 2016 FINDINGS: CARDIOVASCULAR: Heart and pericardium are unremarkable. Thoracic aorta is normal course and caliber, mild calcific atherosclerosis. Though not tailored for evaluation, no central pulmonary embolus. MEDIASTINUM/NODES: No mediastinal mass. No lymphadenopathy by CT size criteria. Greater than expected number of non pathologically enlarged mediastinal and RIGHT hilar lymph nodes are likely  reactive. Normal appearance of thoracic esophagus though not tailored for evaluation. LUNGS/PLEURA: Tracheobronchial tree is patent, no pneumothorax. Minimal bronchial wall thickening. 1 cm air cyst RIGHT middle lobe. No pleural effusions, pulmonary nodules or masses. Enhancing wedge-like consolidation lingula extending to the periphery. Bibasilar subsegmental atelectasis. UPPER ABDOMEN: Nonacute. MUSCULOSKELETAL: Nonacute. Mild midthoracic spondylosis and dextroscoliosis. IMPRESSION: 1. Lingular atelectasis, less likely pneumonia. Additional bibasilar subsegmental atelectasis. 2. Minimal bronchial wall thickening associated with bronchitis or reactive airway disease. Aortic Atherosclerosis (ICD10-I70.0). Electronically Signed   By: CElon AlasM.D.   On: 09/25/2017 00:52    Pending Labs Unresulted Labs (From admission, onward)   Start     Ordered   09/26/17 0500  CBC  Tomorrow morning,   R     09/25/17 0809   09/26/17 09407 Basic metabolic panel  Tomorrow morning,   R     09/25/17 0809   09/25/17 0500  HIV antibody  Tomorrow morning,   R     09/24/17 2257   09/25/17 0500  TSH  Once,   R    Comments:  Cancel if already done within 1 month and notify MD    09/24/17 2258   09/24/17 2258  Culture, sputum-assessment  Once,   R    Question:  Patient immune status  Answer:  Normal   09/24/17 2257   09/24/17 2258  Gram stain  Once,   R    Question:  Patient immune status  Answer:  Normal   09/24/17 2257   09/24/17 2258  Strep pneumoniae urinary antigen  Once,   R     09/24/17 2257   09/24/17 1739  Blood culture (routine x 2)  BLOOD CULTURE X 2,   STAT     09/24/17 1738      Vitals/Pain Today's Vitals   09/25/17 0704 09/25/17 0817 09/25/17 0930 09/25/17 0932  BP: 100/65 119/80 128/88 128/88  Pulse: 64 77 82 75  Resp: _0 Temp:      TempSrc:      SpO2: 95% 91% 90% (!) 85%    Isolation Precautions Droplet precaution  Medications Medications  cefTRIAXone (ROCEPHIN) 1  g in sodium chloride 0.9 % 100 mL IVPB (not administered)  oseltamivir (TAMIFLU) capsule 75 mg (75 mg Oral Given 09/25/17 1040)  amLODipine (NORVASC) tablet 10 mg (10 mg Oral Given 09/25/17 1040)  pravastatin (PRAVACHOL) tablet 40 mg (not administered)  acetaminophen (TYLENOL) tablet 650 mg (not administered)    Or  acetaminophen (TYLENOL) suppository 650 mg (not administered)  HYDROcodone-acetaminophen (NORCO/VICODIN) 5-325 MG per tablet  1-2 tablet (not administered)  ondansetron (ZOFRAN) tablet 4 mg (not administered)    Or  ondansetron (ZOFRAN) injection 4 mg (not administered)  enoxaparin (LOVENOX) injection 40 mg (40 mg Subcutaneous Given 09/25/17 1041)  0.9 %  sodium chloride infusion (100 mL/hr Intravenous New Bag/Given 09/24/17 2323)  albuterol (PROVENTIL) (2.5 MG/3ML) 0.083% nebulizer solution 2.5 mg (not administered)  guaiFENesin (MUCINEX) 12 hr tablet 600 mg (600 mg Oral Given 09/25/17 1040)  iopamidol (ISOVUE-300) 61 % injection (not administered)  ipratropium-albuterol (DUONEB) 0.5-2.5 (3) MG/3ML nebulizer solution 3 mL (3 mLs Nebulization Given 09/25/17 0840)  methadone (DOLOPHINE) 10 MG/ML solution 85 mg (85 mg Oral Given 09/25/17 0819)  predniSONE (DELTASONE) tablet 60 mg (not administered)  ipratropium-albuterol (DUONEB) 0.5-2.5 (3) MG/3ML nebulizer solution 3 mL (3 mLs Nebulization Given 09/24/17 1733)  sodium chloride 0.9 % bolus 1,000 mL (0 mLs Intravenous Stopped 09/24/17 2323)  cefTRIAXone (ROCEPHIN) 1 g in sodium chloride 0.9 % 100 mL IVPB (0 g Intravenous Stopped 09/24/17 1835)  azithromycin (ZITHROMAX) 500 mg in sodium chloride 0.9 % 250 mL IVPB (0 mg Intravenous Stopped 09/24/17 1943)  iopamidol (ISOVUE-300) 61 % injection 75 mL (75 mLs Intravenous Contrast Given 09/25/17 0024)    Mobility walks

## 2017-09-25 NOTE — ED Notes (Signed)
SPO2 while ambulating: 85-88% with no sign of distress nor dyspnea.

## 2017-09-25 NOTE — Progress Notes (Signed)
Patient ambulated in the hall. Patient sats on RA while ambulating 96-97%.

## 2017-09-25 NOTE — Progress Notes (Signed)
[facility] PROGRESS NOTE  Terry [name] ZOX:[id number] DOB: 05/08/1971 DOA: 09/24/2017  PCP: Patient, No Pcp Per  Brief History/Interval Summary: 47 year old male with a past medical history of asthma, hypertension hyperlipidemia presented with 4-day history of shortness of breath cough and fevers.  He had body aches.  Apparently his coworkers were sick.  Patient was diagnosed as having influenza.  Imaging studies raised concern for pneumonia.  He was hospitalized for further management.  Reason for Visit: Community-acquired pneumonia.  Influenza.  Consultants: None  Procedures: None  Antibiotics: Started on ceftriaxone, azithromycin and Tamiflu. Azithromycin will be discontinued due to borderline QT prolongation.  Subjective/Interval History: Patient states that he is feeling much better this morning.  Shortness of breath has improved.  He continues to have a cough with the yellowish expectoration.  Denies any chest pain.  Continues to have some wheezing but that has improved as well.    ROS: Denies any nausea or vomiting  Objective:  Vital Signs  Vitals:   09/25/17 0500 09/25/17 0704 09/25/17 0817 09/25/17 0932  BP: 105/72 100/65 119/80 128/88  Pulse: 63 64 77 75  Resp: 14 11 18 14   Temp:      TempSrc:      SpO2: 99% 95% 91% (!) 85%    Intake/Output Summary (Last 24 hours) at 09/25/2017 1111 Last data filed at 09/24/2017 2323 Gross per 24 hour  Intake 1000 ml  Output -  Net 1000 ml   There were no vitals filed for this visit.  General appearance: alert, cooperative, appears stated age and no distress Head: Normocephalic, without obvious abnormality, atraumatic Resp: Coarse breath sounds bilaterally.  Crackles noted bilateral bases.  He is noted to have scattered wheezes.  Normal effort at rest. Cardio: regular rate and rhythm, S1, S2 normal, no murmur, click, rub or gallop GI: soft, non-tender; bowel sounds normal; no masses,  no organomegaly Extremities:  extremities normal, atraumatic, no cyanosis or edema Neurologic: Awake alert.  Oriented x3.  No focal neurological deficits.  Lab Results:  Data Reviewed: I have personally reviewed following labs and imaging studies  CBC: Recent Labs  Lab 09/24/17 1746 09/24/17 1758  WBC 11.1*  --   NEUTROABS 8.6*  --   HGB 14.8 16.0  HCT 43.8 47.0  MCV 86.4  --   PLT 235  --     Basic Metabolic Panel: Recent Labs  Lab 09/24/17 1746 09/24/17 1758  NA 135 137  K 3.7 3.7  CL 97* 96*  CO2 28  --   GLUCOSE 149* 142*  BUN 15 14  CREATININE 1.12 1.10  CALCIUM 8.7*  --     GFR: CrCl cannot be calculated (Unknown ideal weight.).   Radiology Studies: Dg Chest 2 View  Result Date: 09/24/2017 CLINICAL DATA:  Dyspnea EXAM: CHEST  2 VIEW COMPARISON:  04/09/2017 chest radiograph. FINDINGS: Low lung volumes. Stable cardiomediastinal silhouette with normal heart size. No pneumothorax. No pleural effusion. No pulmonary edema. Hazy bibasilar lung opacities. IMPRESSION: Low lung volumes with hazy bibasilar lung opacities, which could represent atelectasis, aspiration or pneumonia. Recommend follow-up PA and lateral post treatment chest radiographs in 4-6 weeks. Electronically Signed   By: Ilona Sorrel M.D.   On: 09/24/2017 18:16   Ct Chest W Contrast  Result Date: 09/25/2017 CLINICAL DATA:  Productive cough, shortness of breath and fevers for 4 days. Body aches. Significant wheezing. EXAM: CT CHEST WITH CONTRAST TECHNIQUE: Multidetector CT imaging of the chest was performed during intravenous contrast administration. CONTRAST:  59mL ISOVUE-300 IOPAMIDOL (ISOVUE-300) INJECTION 61% COMPARISON:  Chest radiograph September 24, 2016 FINDINGS: CARDIOVASCULAR: Heart and pericardium are unremarkable. Thoracic aorta is normal course and caliber, mild calcific atherosclerosis. Though not tailored for evaluation, no central pulmonary embolus. MEDIASTINUM/NODES: No mediastinal mass. No lymphadenopathy by CT size  criteria. Greater than expected number of non pathologically enlarged mediastinal and RIGHT hilar lymph nodes are likely reactive. Normal appearance of thoracic esophagus though not tailored for evaluation. LUNGS/PLEURA: Tracheobronchial tree is patent, no pneumothorax. Minimal bronchial wall thickening. 1 cm air cyst RIGHT middle lobe. No pleural effusions, pulmonary nodules or masses. Enhancing wedge-like consolidation lingula extending to the periphery. Bibasilar subsegmental atelectasis. UPPER ABDOMEN: Nonacute. MUSCULOSKELETAL: Nonacute. Mild midthoracic spondylosis and dextroscoliosis. IMPRESSION: 1. Lingular atelectasis, less likely pneumonia. Additional bibasilar subsegmental atelectasis. 2. Minimal bronchial wall thickening associated with bronchitis or reactive airway disease. Aortic Atherosclerosis (ICD10-I70.0). Electronically Signed   By: Elon Alas M.D.   On: 09/25/2017 00:52     Medications:  Scheduled: . amLODipine  10 mg Oral Daily  . enoxaparin (LOVENOX) injection  40 mg Subcutaneous Daily  . guaiFENesin  600 mg Oral BID  . iopamidol      . ipratropium-albuterol  3 mL Nebulization TID  . methadone  85 mg Oral Daily  . oseltamivir  75 mg Oral BID  . pravastatin  40 mg Oral q1800  . predniSONE  60 mg Oral BID WC   Continuous: . cefTRIAXone (ROCEPHIN)  IV     NOM:[id number] **OR** acetaminophen, albuterol, HYDROcodone-acetaminophen, ondansetron **OR** ondansetron (ZOFRAN) IV  Assessment/Plan:  Active Problems:   HTN (hypertension)   Community acquired pneumonia   CAP (community acquired pneumonia)   Influenza A   Asthma, chronic, unspecified asthma severity, with acute exacerbation    Acute respiratory failure with hypoxia Most likely due to combination of pneumonia, influenza and asthma exacerbation.  Sats noted to drop into the mid 80s with ambulation.  Continue treatment as outlined below.  We will increase his steroids as well.  Community-acquired  pneumonia CT chest did not show any clear-cut infiltrates. He does have influenza and could have secondary bacterial infection in the form of bronchitis.  We will continue with ceftriaxone.  QT interval noted to be 490.  He is on methadone which can also prolong QT.  In view of this we will stop the azithromycin.  Influenza A Continue Tamiflu.  Continue droplet precautions.  Mild asthma exacerbation Patient with known history of asthma.  Acute exacerbation likely triggered by acute infection.  Continue nebulizer treatments.  Increase steroids.  History of polysubstance abuse Urine drug screen positive for cocaine and marijuana.  Social worker to counsel.  History of methadone use He is followed at an addiction clinic.  He has been taking this for 2 years.  Continue for now.  DVT Prophylaxis: Lovenox    Code Status: Full code Family Communication: Discussed with the patient Disposition Plan: Management as outlined above.    LOS: 1 day   [facility] Pager [phone] 09/25/2017, 11:11 AM  If 7PM-7AM, please contact night-coverage at [email or web address], password [facility]

## 2017-09-25 NOTE — Progress Notes (Signed)
PHARMACIST - PHYSICIAN COMMUNICATION  CONCERNING: Antibiotic IV to Oral Route Change Policy  RECOMMENDATION: This patient is receiving azithromycin by the intravenous route.  Based on criteria approved by the Pharmacy and Therapeutics Committee, the antibiotic(s) is/are being converted to the equivalent oral dose form(s).   DESCRIPTION: These criteria include:  Patient being treated for a respiratory tract infection, urinary tract infection, cellulitis or clostridium difficile associated diarrhea if on metronidazole  The patient is not neutropenic and does not exhibit a GI malabsorption state  The patient is eating (either orally or via tube) and/or has been taking other orally administered medications for a least 24 hours  The patient is improving clinically and has a Tmax < 100.5  Pt on methadone 85 mg and azithromycin which may prolong Qt inteval. Qtc 2/24 490.  rec DC azithromycin   If you have questions about this conversion, please contact the Pharmacy Department  []   [phone] )  [facility] []   [phone] )  [facility] []   [phone] )  [facility] []   [phone] )  [facility] [x]   [phone] )  [facility]

## 2017-09-25 NOTE — ED Notes (Signed)
Walked pt with pulse oximeter o2 stat 85. Pt had no complaints of SHOB

## 2017-09-26 LAB — BASIC METABOLIC PANEL
ANION GAP: 13 (ref 5–15)
BUN: 13 mg/dL (ref 6–20)
CALCIUM: 8.8 mg/dL — AB (ref 8.9–10.3)
CO2: 22 mmol/L (ref 22–32)
Chloride: 103 mmol/L (ref 101–111)
Creatinine, Ser: 0.83 mg/dL (ref 0.61–1.24)
Glucose, Bld: 146 mg/dL — ABNORMAL HIGH (ref 65–99)
Potassium: 4.2 mmol/L (ref 3.5–5.1)
Sodium: 138 mmol/L (ref 135–145)

## 2017-09-26 LAB — CBC
HEMATOCRIT: 39 % (ref 39.0–52.0)
Hemoglobin: 13.1 g/dL (ref 13.0–17.0)
MCH: 28.3 pg (ref 26.0–34.0)
MCHC: 33.6 g/dL (ref 30.0–36.0)
MCV: 84.2 fL (ref 78.0–100.0)
PLATELETS: 247 10*3/uL (ref 150–400)
RBC: 4.63 MIL/uL (ref 4.22–5.81)
RDW: 11.8 % (ref 11.5–15.5)
WBC: 7.7 10*3/uL (ref 4.0–10.5)

## 2017-09-26 LAB — HIV ANTIBODY (ROUTINE TESTING W REFLEX): HIV SCREEN 4TH GENERATION: NONREACTIVE

## 2017-09-26 MED ORDER — OSELTAMIVIR PHOSPHATE 75 MG PO CAPS: 75.0000 mg | ORAL_CAPSULE | Freq: Two times a day (BID) | ORAL | 0 refills | Status: AC

## 2017-09-26 MED ORDER — PREDNISONE 20 MG PO TABS: ORAL_TABLET | ORAL | 0 refills | Status: DC

## 2017-09-26 MED ORDER — CEFPODOXIME PROXETIL 200 MG PO TABS: 200.0000 mg | ORAL_TABLET | Freq: Two times a day (BID) | ORAL | 0 refills | Status: AC

## 2017-09-26 MED ORDER — CEFPODOXIME PROXETIL 200 MG PO TABS
200.0000 mg | ORAL_TABLET | Freq: Two times a day (BID) | ORAL | Status: DC
  Administered 2017-09-26: 200 mg via ORAL
  Filled 2017-09-26: qty 1

## 2017-09-26 NOTE — Discharge Summary (Signed)
[facility]  Physician Discharge Summary   Patient ID: Terry [name] MRN: [id number] DOB/AGE: 45-Aug-1972 45 y.o.  Admit date: 09/24/2017 Discharge date: 09/26/2017  PCP: Patient, No Pcp Per  DISCHARGE DIAGNOSES:  Active Problems:   HTN (hypertension)   Community acquired pneumonia   CAP (community acquired pneumonia)   Influenza A   Asthma, chronic, unspecified asthma severity, with acute exacerbation   RECOMMENDATIONS FOR OUTPATIENT FOLLOW UP: 1. Patient instructed to follow-up with primary care provider   DISCHARGE CONDITION: fair  Diet recommendation: As before  [facility] Weights   09/25/17 1636  Weight: 95.3 kg (210 lb)    INITIAL HISTORY: 47 year old male with a past medical history of asthma, hypertension hyperlipidemia presented with 4-day history of shortness of breath cough and fevers.  He had body aches.  Apparently his coworkers were sick.  Patient was diagnosed as having influenza.  Imaging studies raised concern for pneumonia.  He was hospitalized for further management.   HOSPITAL COURSE:   Acute respiratory failure with hypoxia Most likely due to combination of pneumonia, influenza and asthma exacerbation.    Initially sats were noted to drop into the mid 80s with ambulation.  Patient was started on antibiotics he was also given steroids.  He quickly improved.  Now saturating normal on room air.  Ambulating without any difficulties.  Community-acquired pneumonia CT chest did not show any clear-cut infiltrates. He does have influenza and could have secondary bacterial infection in the form of bronchitis.    He was given ceftriaxone.  He was also given azithromycin.   QT interval noted to be 490. He is on methadone which can also prolong QT.  In view of this azithromycin was discontinued.  He will be discharged on oral Vantin.    Influenza A Continue Tamiflu.  Now afebrile.  Mild asthma exacerbation Patient with known history of asthma.  Acute  exacerbation likely triggered by acute infection.    Improved with nebulizer treatments and steroids.  He may continue his home medications.  Steroid taper.  History of polysubstance abuse Urine drug screen positive for cocaine and marijuana.    History of methadone use He is followed at an addiction clinic.  He has been taking this for 2 years.   Overall stable.  Okay for discharge home today.    PERTINENT LABS:  The results of significant diagnostics from this hospitalization (including imaging, microbiology, ancillary and laboratory) are listed below for reference.    Microbiology: Recent Results (from the past 240 hour(s))  Blood culture (routine x 2)     Status: None (Preliminary result)   Collection Time: 09/24/17  5:46 PM  Result Value Ref Range Status   Specimen Description   Final    LEFT ANTECUBITAL Performed at [facility] [address] [place], [place] [address]    Special Requests   Final    BOTTLES DRAWN AEROBIC AND ANAEROBIC Blood Culture adequate volume Performed at [facility] [address], [place], [place] [address]    Culture   Final    NO GROWTH < 24 HOURS Performed at [facility] [address], [place], [place] [address]    Report Status PENDING  Incomplete  Blood culture (routine x 2)     Status: None (Preliminary result)   Collection Time: 09/24/17  5:46 PM  Result Value Ref Range Status   Specimen Description   Final    RIGHT ANTECUBITAL Performed at [facility]  [address], [place], [place] [address]    Special Requests   Final    BOTTLES DRAWN AEROBIC AND ANAEROBIC Blood Culture adequate volume Performed at [facility] [address] [place], [place] [address]    Culture   Final    NO GROWTH < 24 HOURS Performed at [facility] [address], [place], [place] [address]    Report Status PENDING  Incomplete  Respiratory Panel by PCR      Status: None   Collection Time: 09/24/17  8:17 PM  Result Value Ref Range Status   Adenovirus NOT DETECTED NOT DETECTED Final   Coronavirus 229E NOT DETECTED NOT DETECTED Final   Coronavirus HKU1 NOT DETECTED NOT DETECTED Final   Coronavirus NL63 NOT DETECTED NOT DETECTED Final   Coronavirus OC43 NOT DETECTED NOT DETECTED Final   Metapneumovirus NOT DETECTED NOT DETECTED Final   Rhinovirus / Enterovirus NOT DETECTED NOT DETECTED Final   Influenza A NOT DETECTED NOT DETECTED Final   Influenza B NOT DETECTED NOT DETECTED Final   Parainfluenza Virus 1 NOT DETECTED NOT DETECTED Final   Parainfluenza Virus 2 NOT DETECTED NOT DETECTED Final   Parainfluenza Virus 3 NOT DETECTED NOT DETECTED Final   Parainfluenza Virus 4 NOT DETECTED NOT DETECTED Final   Respiratory Syncytial Virus NOT DETECTED NOT DETECTED Final   Bordetella pertussis NOT DETECTED NOT DETECTED Final   Chlamydophila pneumoniae NOT DETECTED NOT DETECTED Final   Mycoplasma pneumoniae NOT DETECTED NOT DETECTED Final    Comment: Performed at [facility], [place] [address] [place], [place] [address]     Labs: Basic Metabolic Panel: Recent Labs  Lab 09/24/17 1746 09/24/17 1758 09/25/17 1032 09/26/17 0501  NA 135 137 137 138  K 3.7 3.7 3.7 4.2  CL 97* 96* 100* 103  CO2 28  --  25 22  GLUCOSE 149* 142* 203* 146*  BUN 15 14 13 13   CREATININE 1.12 1.10 0.99 0.83  CALCIUM 8.7*  --  8.2* 8.8*  MG  --   --  2.2  --   PHOS  --   --  1.5*  --    Liver Function Tests: Recent Labs  Lab 09/25/17 1032  AST 30  ALT 31  ALKPHOS 66  BILITOT 0.6  PROT 6.6  ALBUMIN 3.3*   CBC: Recent Labs  Lab 09/24/17 1746 09/24/17 1758 09/25/17 1032 09/26/17 0501  WBC 11.1*  --  6.9 7.7  NEUTROABS 8.6*  --   --   --   HGB 14.8 16.0 13.1 13.1  HCT 43.8 47.0 40.8 39.0  MCV 86.4  --  87.7 84.2  PLT 235  --  227 247    IMAGING STUDIES Dg Chest 2 View  Result Date: 09/24/2017 CLINICAL DATA:  Dyspnea EXAM: CHEST  2 VIEW  COMPARISON:  04/09/2017 chest radiograph. FINDINGS: Low lung volumes. Stable cardiomediastinal silhouette with normal heart size. No pneumothorax. No pleural effusion. No pulmonary edema. Hazy bibasilar lung opacities. IMPRESSION: Low lung volumes with hazy bibasilar lung opacities, which could represent atelectasis, aspiration or pneumonia. Recommend follow-up PA and lateral post treatment chest radiographs in 4-6 weeks. Electronically Signed   By: Ilona Sorrel M.D.   On: 09/24/2017 18:16   Ct Chest W Contrast  Result Date: 09/25/2017 CLINICAL DATA:  Productive cough, shortness of breath and fevers for 4 days. Body aches. Significant wheezing. EXAM: CT CHEST WITH CONTRAST TECHNIQUE: Multidetector CT imaging of the chest was performed during intravenous contrast administration.  CONTRAST:  60mL ISOVUE-300 IOPAMIDOL (ISOVUE-300) INJECTION 61% COMPARISON:  Chest radiograph September 24, 2016 FINDINGS: CARDIOVASCULAR: Heart and pericardium are unremarkable. Thoracic aorta is normal course and caliber, mild calcific atherosclerosis. Though not tailored for evaluation, no central pulmonary embolus. MEDIASTINUM/NODES: No mediastinal mass. No lymphadenopathy by CT size criteria. Greater than expected number of non pathologically enlarged mediastinal and RIGHT hilar lymph nodes are likely reactive. Normal appearance of thoracic esophagus though not tailored for evaluation. LUNGS/PLEURA: Tracheobronchial tree is patent, no pneumothorax. Minimal bronchial wall thickening. 1 cm air cyst RIGHT middle lobe. No pleural effusions, pulmonary nodules or masses. Enhancing wedge-like consolidation lingula extending to the periphery. Bibasilar subsegmental atelectasis. UPPER ABDOMEN: Nonacute. MUSCULOSKELETAL: Nonacute. Mild midthoracic spondylosis and dextroscoliosis. IMPRESSION: 1. Lingular atelectasis, less likely pneumonia. Additional bibasilar subsegmental atelectasis. 2. Minimal bronchial wall thickening associated with  bronchitis or reactive airway disease. Aortic Atherosclerosis (ICD10-I70.0). Electronically Signed   By: Elon Alas M.D.   On: 09/25/2017 00:52    DISCHARGE EXAMINATION: Vitals:   09/25/17 2122 09/25/17 2147 09/26/17 0631 09/26/17 0820  BP:  137/87 121/82   Pulse:  90 64   Resp:  18 18   Temp:  97.8 F (36.6 C) 97.6 F (36.4 C)   TempSrc:  Oral Oral   SpO2: 94% 91% 95% 97%  Weight:      Height:       General appearance: alert, cooperative, appears stated age and no distress Resp: clear to auscultation bilaterally Cardio: regular rate and rhythm, S1, S2 normal, no murmur, click, rub or gallop GI: soft, non-tender; bowel sounds normal; no masses,  no organomegaly Extremities: extremities normal, atraumatic, no cyanosis or edema Neurologic: no focal deficits  DISPOSITION: home  Discharge Instructions    Call MD for:  extreme fatigue   Complete by:  As directed    Call MD for:  persistant dizziness or light-headedness   Complete by:  As directed    Call MD for:  persistant nausea and vomiting   Complete by:  As directed    Call MD for:  severe uncontrolled pain   Complete by:  As directed    Call MD for:  temperature >100.4   Complete by:  As directed    Diet - low sodium heart healthy   Complete by:  As directed    Discharge instructions   Complete by:  As directed    Please be sure to follow up with PCP in 1 week.  You were cared for by a hospitalist during your hospital stay. If you have any questions about your discharge medications or the care you received while you were in the hospital after you are discharged, you can call the unit and asked to speak with the hospitalist on call if the hospitalist that took care of you is not available. Once you are discharged, your primary care physician will handle any further medical issues. Please note that NO REFILLS for any discharge medications will be authorized once you are discharged, as it is imperative that you return  to your primary care physician (or establish a relationship with a primary care physician if you do not have one) for your aftercare needs so that they can reassess your need for medications and monitor your lab values. If you do not have a primary care physician, you can call [phone] for a physician referral.   Increase activity slowly   Complete by:  As directed         Allergies as of 09/26/2017  No Known Allergies     Medication List    STOP taking these medications   cloNIDine 0.2 MG tablet Commonly known as:  CATAPRES   predniSONE 10 MG (21) Tbpk tablet Commonly known as:  STERAPRED UNI-PAK 21 TAB Replaced by:  predniSONE 20 MG tablet   valACYclovir 1000 MG tablet Commonly known as:  VALTREX     TAKE these medications   albuterol 108 (90 Base) MCG/ACT inhaler Commonly known as:  PROVENTIL HFA;VENTOLIN HFA Inhale 2 puffs into the lungs every 6 (six) hours as needed for wheezing or shortness of breath. What changed:  Another medication with the same name was removed. Continue taking this medication, and follow the directions you see here.   amLODipine 10 MG tablet Commonly known as:  NORVASC Take 1 tablet (10 mg total) by mouth daily.   cefpodoxime 200 MG tablet Commonly known as:  VANTIN Take 1 tablet (200 mg total) by mouth every 12 (twelve) hours for 5 days.   Fluticasone-Salmeterol 100-50 MCG/DOSE Aepb Commonly known as:  ADVAIR DISKUS Inhale 1 puff into the lungs 2 (two) times daily.   guaifenesin 100 MG/5ML syrup Commonly known as:  ROBITUSSIN Take 200 mg by mouth 3 (three) times daily as needed for cough.   methadone 10 MG/ML solution Commonly known as:  DOLOPHINE Take 85 mg by mouth daily.   NYQUIL COLD & FLU PO Take 30 mLs by mouth as needed (cold).   oseltamivir 75 MG capsule Commonly known as:  TAMIFLU Take 1 capsule (75 mg total) by mouth 2 (two) times daily for 3 days.   pravastatin 40 MG tablet Commonly known as:  PRAVACHOL Take 1 tablet  (40 mg total) by mouth daily.   predniSONE 20 MG tablet Commonly known as:  DELTASONE Take 3 tablets once daily for 3 days, then take 2 tablets once daily for 3 days, then take 1 tablet once daily for 3 days, then STOP. Replaces:  predniSONE 10 MG (21) Tbpk tablet          TOTAL DISCHARGE TIME: 35 mins  [facility] Pager [phone]  09/26/2017, 2:17 PM

## 2017-09-26 NOTE — Discharge Instructions (Signed)

## 2017-09-29 LAB — CULTURE, BLOOD (ROUTINE X 2)
Culture: NO GROWTH
Culture: NO GROWTH
Special Requests: ADEQUATE
Special Requests: ADEQUATE

## 2018-06-08 ENCOUNTER — Ambulatory Visit (INDEPENDENT_AMBULATORY_CARE_PROVIDER_SITE_OTHER): Payer: BLUE CROSS/BLUE SHIELD

## 2018-06-08 ENCOUNTER — Ambulatory Visit (HOSPITAL_COMMUNITY)
Admission: EM | Admit: 2018-06-08 | Discharge: 2018-06-08 | Disposition: A | Discharge status: Home / Self Care | Payer: BLUE CROSS/BLUE SHIELD | Attending: Family Medicine | Admitting: Family Medicine

## 2018-06-08 ENCOUNTER — Encounter (HOSPITAL_COMMUNITY): Payer: Self-pay | Admitting: Emergency Medicine

## 2018-06-08 DIAGNOSIS — R062 Wheezing: Secondary | ICD-10-CM | POA: Diagnosis not present

## 2018-06-08 DIAGNOSIS — I10 Essential (primary) hypertension: Secondary | ICD-10-CM

## 2018-06-08 DIAGNOSIS — R0602 Shortness of breath: Secondary | ICD-10-CM

## 2018-06-08 MED ORDER — FLUTICASONE-SALMETEROL 100-50 MCG/DOSE IN AEPB: 1.0000 | INHALATION_SPRAY | Freq: Two times a day (BID) | RESPIRATORY_TRACT | 0 refills | Status: DC

## 2018-06-08 MED ORDER — ALBUTEROL SULFATE HFA 108 (90 BASE) MCG/ACT IN AERS: 2.0000 | INHALATION_SPRAY | Freq: Four times a day (QID) | RESPIRATORY_TRACT | 0 refills | Status: DC | PRN

## 2018-06-08 MED ORDER — PRAVASTATIN SODIUM 40 MG PO TABS: 40.0000 mg | ORAL_TABLET | Freq: Every day | ORAL | 0 refills | Status: DC

## 2018-06-08 MED ORDER — IPRATROPIUM-ALBUTEROL 0.5-2.5 (3) MG/3ML IN SOLN
RESPIRATORY_TRACT | Status: AC
  Filled 2018-06-08: qty 3

## 2018-06-08 MED ORDER — PREDNISONE 50 MG PO TABS: 50.0000 mg | ORAL_TABLET | Freq: Every day | ORAL | 0 refills | Status: AC

## 2018-06-08 MED ORDER — IPRATROPIUM-ALBUTEROL 0.5-2.5 (3) MG/3ML IN SOLN
3.0000 mL | Freq: Once | RESPIRATORY_TRACT | Status: AC
  Administered 2018-06-08: 3 mL via RESPIRATORY_TRACT

## 2018-06-08 MED ORDER — AMLODIPINE BESYLATE 10 MG PO TABS: 10.0000 mg | ORAL_TABLET | Freq: Every day | ORAL | 0 refills | Status: DC

## 2018-06-08 NOTE — ED Provider Notes (Signed)
[facility]    CSN: [id number] Arrival date & time: 06/08/18  0930     History   Chief Complaint Chief Complaint  Patient presents with  . Shortness of Breath    HPI Terry [name] is a 47 y.o. male history of hypertension, hyperlipidemia and asthma presenting today for evaluation of shortness of breath and wheezing.  Patient states that over the past 2 weeks he has had worsening symptoms.  Prior to this he had a viral URI.  He has been using his albuterol inhaler very frequently.  Patient states that he has never formally been diagnosed with asthma, but has been treated as such.  He has been going through his albuterol inhaler every few weeks instead of lasting for months.  He at his last visit he was prescribed Advair which he believes helped with his symptoms, but he does not have a primary care at this time and has been unable to get a refill.  Patient does have insurance.  Any fevers.  Denies chest pain.  But has had increased shortness of breath with walking.  HPI  Past Medical History:  Diagnosis Date  . Asthma   . Hyperlipidemia   . Hypertension   . Pneumonia     Patient Active Problem List   Diagnosis Date Noted  . Influenza A 09/24/2017  . Asthma, chronic, unspecified asthma severity, with acute exacerbation 09/24/2017  . Sinus tachycardia   . Community acquired pneumonia 12/08/2014  . CAP (community acquired pneumonia) 12/08/2014  . HTN (hypertension) 10/11/2012  . Pure hypercholesterolemia 10/11/2012  . Allergic rhinitis 10/11/2012  . Impetigo 10/11/2012  . 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 TRICEPS TENDON REPAIR Left 04/2013       Home Medications    Prior to Admission medications   Medication Sig Start Date End Date Taking? Authorizing Provider  albuterol (PROVENTIL HFA;VENTOLIN HFA) 108 (90 Base) MCG/ACT inhaler Inhale 2 puffs into the lungs every 6 (six) hours as needed for wheezing or  shortness of breath. 06/08/18   Wieters, Hallie C, PA-C  amLODipine (NORVASC) 10 MG tablet Take 1 tablet (10 mg total) by mouth daily. 06/08/18   Wieters, Hallie C, PA-C  DM-Doxylamine-Acetaminophen (NYQUIL COLD & FLU PO) Take 30 mLs by mouth as needed (cold).    [provider]  Fluticasone-Salmeterol (ADVAIR DISKUS) 100-50 MCG/DOSE AEPB Inhale 1 puff into the lungs 2 (two) times daily. 06/08/18   Wieters, Hallie C, PA-C  guaifenesin (ROBITUSSIN) 100 MG/5ML syrup Take 200 mg by mouth 3 (three) times daily as needed for cough.    [provider]  methadone (DOLOPHINE) 10 MG/ML solution Take 85 mg by mouth daily.     [provider]  pravastatin (PRAVACHOL) 40 MG tablet Take 1 tablet (40 mg total) by mouth daily. 06/08/18   Wieters, Hallie C, PA-C  predniSONE (DELTASONE) 50 MG tablet Take 1 tablet (50 mg total) by mouth daily for 5 days. 06/08/18 06/13/18  Wieters, Elesa Hacker, PA-C    Family History Family History  Problem Relation Age of Onset  . Diabetes Mother   . Diabetes Father     Social History Social History   Tobacco Use  . Smoking status: Never Smoker  . Smokeless tobacco: Never Used  Substance Use Topics  . Alcohol use: Yes  . Drug use: Yes    Types: Marijuana     Allergies   Patient has no known allergies.  Review of Systems Review of Systems  Constitutional: Negative for activity change, appetite change, chills, fatigue and fever.  HENT: Negative for congestion, ear pain, rhinorrhea, sinus pressure, sore throat and trouble swallowing.   Eyes: Negative for discharge and redness.  Respiratory: Positive for cough, shortness of breath and wheezing. Negative for chest tightness.   Cardiovascular: Negative for chest pain.  Gastrointestinal: Negative for abdominal pain, diarrhea, nausea and vomiting.  Musculoskeletal: Negative for myalgias.  Skin: Negative for rash.  Neurological: Negative for dizziness, light-headedness and headaches.      Physical Exam Triage Vital Signs ED Triage Vitals [06/08/18 1024]  Enc Vitals Group     BP (!) 151/92     Pulse Rate 72     Resp      Temp 98.4 F (36.9 C)     Temp Source Oral     SpO2 94 %     Weight      Height      Head Circumference      Peak Flow      Pain Score 0     Pain Loc      Pain Edu?      Excl. in [place]?    No data found.  Updated Vital Signs BP (!) 151/92 (BP Location: Right Arm)   Pulse 72   Temp 98.4 F (36.9 C) (Oral)   SpO2 94%   Visual Acuity Right Eye Distance:   Left Eye Distance:   Bilateral Distance:    Right Eye Near:   Left Eye Near:    Bilateral Near:     Physical Exam  Constitutional: He is oriented to person, place, and time. He appears well-developed and well-nourished.  HENT:  Head: Normocephalic and atraumatic.  Bilateral ears without tenderness to palpation of external auricle, tragus and mastoid, EAC's without erythema or swelling, TM's with good bony landmarks and cone of light. Non erythematous.  Nasal mucosa erythematous, left turbinates swollen with clear rhinorrhea  Oral mucosa pink and moist, no tonsillar enlargement or exudate. Posterior pharynx patent and erythematous, no uvula deviation or swelling. Normal phonation.  Eyes: Conjunctivae are normal.  Neck: Neck supple.  Cardiovascular: Normal rate and regular rhythm.  No murmur heard. Pulmonary/Chest: Effort normal. No respiratory distress. He has wheezes.  Audible wheezing prior to auscultation, patient is breathing comfortably at rest, no accessory muscle use, inspiratory and expiratory wheezing throughout bilateral lung fields  Abdominal: Soft. There is no tenderness.  Musculoskeletal: He exhibits no edema.  Neurological: He is alert and oriented to person, place, and time.  Skin: Skin is warm and dry.  Psychiatric: He has a normal mood and affect.  Nursing note and vitals reviewed.    UC Treatments / Results  Labs (all labs ordered are listed, but only  abnormal results are displayed) Labs Reviewed - No data to display  EKG None  Radiology Dg Chest 2 View  Result Date: 06/08/2018 CLINICAL DATA:  Wheezing, shortness of breath, nasal congestion for 2 weeks, history of diabetes and hypertension EXAM: CHEST - 2 VIEW COMPARISON:  CT chest of 09/25/2016 and chest x-ray of 09/24/2017 FINDINGS: The atelectasis involving the lingula appears to have resolved. No pneumonia or effusion is currently seen. There is some peribronchial thickening which may indicate bronchitis. Mediastinal and hilar contours are unchanged and heart size is stable. No acute bony abnormality is seen. IMPRESSION: 1. Resolution of lingular atelectasis. 2. Peribronchial thickening may indicate bronchitis. Electronically Signed   By: Windy Canny.D.  On: 06/08/2018 11:01    Procedures Procedures (including critical care time)  Medications Ordered in UC Medications  ipratropium-albuterol (DUONEB) 0.5-2.5 (3) MG/3ML nebulizer solution 3 mL (3 mLs Nebulization Given 06/08/18 1039)    Initial Impression / Assessment and Plan / UC Course  I have reviewed the triage vital signs and the nursing notes.  Pertinent labs & imaging results that were available during my care of the patient were reviewed by me and considered in my medical decision making (see chart for details).     Lung sounds improved after breathing treatment, but still present.  No pneumonia on chest x-ray.  Will refill albuterol, Advair, prednisone for 5 days.  Refilled amlodipine and pravastatin as patient is also out of this.  Discussed establishing care with primary care.  Symptoms improved after breathing treatment significantly.  Left patient continue to monitor and follow-up if symptoms worsening.Discussed strict return precautions. Patient verbalized understanding and is agreeable with plan.  Final Clinical Impressions(s) / UC Diagnoses   Final diagnoses:  SOB (shortness of breath)     Discharge  Instructions     No pneumonia on x-ray Please begin prednisone 50 mg with food, take for the next 5 days Please use albuterol inhaler Advair refilled  I have also refilled your amlodipine and pravastatin-please monitor your blood pressure  Please get set up with primary care Return here emergency room shortness of breath worsening, developing chest discomfort, difficulty breathing or fevers.    ED Prescriptions    Medication Sig Dispense Auth. Provider   amLODipine (NORVASC) 10 MG tablet Take 1 tablet (10 mg total) by mouth daily. 60 tablet Wieters, Hallie C, PA-C   pravastatin (PRAVACHOL) 40 MG tablet Take 1 tablet (40 mg total) by mouth daily. 30 tablet Wieters, Hallie C, PA-C   Fluticasone-Salmeterol (ADVAIR DISKUS) 100-50 MCG/DOSE AEPB  (Status: Discontinued) Inhale 1 puff into the lungs 2 (two) times daily. 60 each Wieters, Hallie C, PA-C   albuterol (PROVENTIL HFA;VENTOLIN HFA) 108 (90 Base) MCG/ACT inhaler  (Status: Discontinued) Inhale 2 puffs into the lungs every 6 (six) hours as needed for wheezing or shortness of breath. 2 Inhaler Wieters, Hallie C, PA-C   predniSONE (DELTASONE) 50 MG tablet Take 1 tablet (50 mg total) by mouth daily for 5 days. 5 tablet Wieters, Hallie C, PA-C   albuterol (PROVENTIL HFA;VENTOLIN HFA) 108 (90 Base) MCG/ACT inhaler Inhale 2 puffs into the lungs every 6 (six) hours as needed for wheezing or shortness of breath. 2 Inhaler Wieters, Hallie C, PA-C   Fluticasone-Salmeterol (ADVAIR DISKUS) 100-50 MCG/DOSE AEPB Inhale 1 puff into the lungs 2 (two) times daily. 60 each Wieters, Hallie C, PA-C     Controlled Substance Prescriptions [place] Controlled Substance Registry consulted? Not Applicable   Janith Lima, [place] 06/08/18 1108

## 2018-06-08 NOTE — ED Triage Notes (Signed)
Per pt he has been dealing with sob for about 2 weeks now and is getting worse. Pt presents here today with wheezing

## 2018-06-08 NOTE — Discharge Instructions (Signed)
No pneumonia on x-ray Please begin prednisone 50 mg with food, take for the next 5 days Please use albuterol inhaler Advair refilled  I have also refilled your amlodipine and pravastatin-please monitor your blood pressure  Please get set up with primary care Return here emergency room shortness of breath worsening, developing chest discomfort, difficulty breathing or fevers.

## 2018-10-13 ENCOUNTER — Ambulatory Visit (INDEPENDENT_AMBULATORY_CARE_PROVIDER_SITE_OTHER): Payer: BLUE CROSS/BLUE SHIELD | Admitting: Family Medicine

## 2018-10-13 ENCOUNTER — Encounter: Payer: Self-pay | Admitting: Family Medicine

## 2018-10-13 ENCOUNTER — Other Ambulatory Visit: Payer: Self-pay

## 2018-10-13 VITALS — BP 130/80 | HR 88 | Temp 98.1°F | Wt 254.7 lb

## 2018-10-13 DIAGNOSIS — J4541 Moderate persistent asthma with (acute) exacerbation: Secondary | ICD-10-CM

## 2018-10-13 DIAGNOSIS — Z23 Encounter for immunization: Secondary | ICD-10-CM

## 2018-10-13 DIAGNOSIS — I10 Essential (primary) hypertension: Secondary | ICD-10-CM | POA: Diagnosis not present

## 2018-10-13 DIAGNOSIS — E7800 Pure hypercholesterolemia, unspecified: Secondary | ICD-10-CM | POA: Diagnosis not present

## 2018-10-13 DIAGNOSIS — Z8249 Family history of ischemic heart disease and other diseases of the circulatory system: Secondary | ICD-10-CM | POA: Diagnosis not present

## 2018-10-13 DIAGNOSIS — R7301 Impaired fasting glucose: Secondary | ICD-10-CM | POA: Diagnosis not present

## 2018-10-13 DIAGNOSIS — B001 Herpesviral vesicular dermatitis: Secondary | ICD-10-CM

## 2018-10-13 LAB — CBC WITH DIFFERENTIAL/PLATELET
BASOS ABS: 0.1 10*3/uL (ref 0.0–0.1)
BASOS PCT: 0.8 % (ref 0.0–3.0)
EOS ABS: 0.9 10*3/uL — AB (ref 0.0–0.7)
Eosinophils Relative: 7.3 % — ABNORMAL HIGH (ref 0.0–5.0)
HCT: 41.2 % (ref 39.0–52.0)
Hemoglobin: 13.8 g/dL (ref 13.0–17.0)
Lymphocytes Relative: 42.1 % (ref 12.0–46.0)
Lymphs Abs: 5 10*3/uL — ABNORMAL HIGH (ref 0.7–4.0)
MCHC: 33.5 g/dL (ref 30.0–36.0)
MCV: 84.7 fl (ref 78.0–100.0)
MONO ABS: 0.7 10*3/uL (ref 0.1–1.0)
Monocytes Relative: 6.2 % (ref 3.0–12.0)
NEUTROS ABS: 5.2 10*3/uL (ref 1.4–7.7)
Neutrophils Relative %: 43.6 % (ref 43.0–77.0)
PLATELETS: 342 10*3/uL (ref 150.0–400.0)
RBC: 4.86 Mil/uL (ref 4.22–5.81)
RDW: 12.8 % (ref 11.5–15.5)
WBC: 11.9 10*3/uL — ABNORMAL HIGH (ref 4.0–10.5)

## 2018-10-13 LAB — LIPID PANEL
Cholesterol: 229 mg/dL — ABNORMAL HIGH (ref 0–200)
HDL: 42 mg/dL (ref >39.00)
Total CHOL/HDL Ratio: 5

## 2018-10-13 LAB — COMPREHENSIVE METABOLIC PANEL
ALT: 39 U/L (ref 0–53)
AST: 28 U/L (ref 0–37)
Albumin: 4.5 g/dL (ref 3.5–5.2)
Alkaline Phosphatase: 76 U/L (ref 39–117)
BUN: 21 mg/dL (ref 6–23)
CALCIUM: 9.8 mg/dL (ref 8.4–10.5)
CHLORIDE: 98 meq/L (ref 96–112)
CO2: 34 mEq/L — ABNORMAL HIGH (ref 19–32)
Creatinine, Ser: 1.15 mg/dL (ref 0.40–1.50)
GFR: 68.68 mL/min (ref >60.00)
Glucose, Bld: 98 mg/dL (ref 70–99)
POTASSIUM: 4.3 meq/L (ref 3.5–5.1)
Sodium: 139 mEq/L (ref 135–145)
Total Bilirubin: 0.4 mg/dL (ref 0.2–1.2)
Total Protein: 7.1 g/dL (ref 6.0–8.3)

## 2018-10-13 LAB — HEMOGLOBIN A1C: Hgb A1c MFr Bld: 6.5 % (ref 4.6–6.5)

## 2018-10-13 LAB — TSH: TSH: 5.95 u[IU]/mL — AB (ref 0.35–4.50)

## 2018-10-13 LAB — LDL CHOLESTEROL, DIRECT: Direct LDL: 130 mg/dL

## 2018-10-13 MED ORDER — VALACYCLOVIR HCL 500 MG PO TABS: 500.0000 mg | ORAL_TABLET | Freq: Every day | ORAL | 1 refills | Status: DC

## 2018-10-13 MED ORDER — FLUTICASONE-SALMETEROL 115-21 MCG/ACT IN AERO: 2.0000 | INHALATION_SPRAY | Freq: Two times a day (BID) | RESPIRATORY_TRACT | 11 refills | Status: DC

## 2018-10-13 MED ORDER — ALBUTEROL SULFATE 108 (90 BASE) MCG/ACT IN AEPB: 1.0000 | INHALATION_SPRAY | RESPIRATORY_TRACT | 2 refills | Status: DC | PRN

## 2018-10-13 MED ORDER — E-Z SPACER DEVI: 2 refills | Status: AC

## 2018-10-13 NOTE — Progress Notes (Signed)
Terry [name] DOB: Dec 06, 1970 Encounter date: 10/13/2018  This is a 48 y.o. male who presents to establish care. Chief Complaint  Patient presents with  . New Patient (Initial Visit)    History of present illness: Has been in and out of the hospital a lot recently and always asked if he has asthma. Hasn't had a regular doctor in the past. Has been admitted to hospital in last few years with pneumonia, resp infection. States that he has always been dx with something different even though symptoms have been the same.   7-8 yrs ago was on blood pressure and cholesterol medication. Feels he needs to get back on these.   85 lb weight gain in last 2-3 years. Thinks related to methadone. Goes to methadone clinic. Would like to start coming off of this. Feels more comfortable around 180's. That's where he would like to be. States he has not "really tried" to get back into healthier eating and working out. Was treated after ortho surgeries with pain medication and developed addiction. Progressed to more significant habit and got to point of snorting cocaine. Put self into rehab when wife threatened to leave him. He has done ok with methadone, but didn't think he would be on it this long. Would like to decrease/cut back.   Has also been told he was borderline diabetic in the past. Would like to get this checked.  HTN: on amlodipine 10mg  but has been out for months.   Asthma: on advair and albuterol. Is out of both x 2 weeks. Sx have been getting worse since he ran out. Kept having to take prednisone to bring down asthma symptoms. Really just has come into picture in last few years. Did get cats a couple of years ago and wonders if that triggered.   Sometimes gets watery eyes that he thinks are allergy symptoms.   Mood is "alright". Past few years has been harder overcoming addiction. No thoughts of hurting. Tries to keep self busy. 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  Relationship is more strained than in past, but they are doing ok. He does not regularly go to therapy. He does not go to narcotics anon. Wife does. He feels he does best if he just works on staying busy.  Past Medical History:  Diagnosis Date  . Asthma   . Hyperlipidemia   . Hypertension   . Opioid abuse ([place])    following with methadone clinic  . Pneumonia    Past Surgical History:  Procedure Laterality Date  . right bicep Right 2013  . TRICEPS TENDON REPAIR Left 04/2013   No Known Allergies Current Meds  Medication Sig  . amLODipine (NORVASC) 10 MG tablet Take 1 tablet (10 mg total) by mouth daily.  Marland Kitchen guaifenesin (ROBITUSSIN) 100 MG/5ML syrup Take 200 mg by mouth 3 (three) times daily as needed for cough.  . methadone (DOLOPHINE) 10 MG/ML solution Take 85 mg by mouth daily.   . pravastatin (PRAVACHOL) 40 MG tablet Take 1 tablet (40 mg total) by mouth daily.  . [DISCONTINUED] albuterol (PROVENTIL HFA;VENTOLIN HFA) 108 (90 Base) MCG/ACT inhaler Inhale 2 puffs into the lungs every 6 (six) hours as needed for wheezing or shortness of breath.  . [DISCONTINUED] DM-Doxylamine-Acetaminophen (NYQUIL COLD & FLU PO) Take 30 mLs by mouth as needed (cold).  . [DISCONTINUED] Fluticasone-Salmeterol (ADVAIR DISKUS) 100-50 MCG/DOSE AEPB Inhale 1 puff into the lungs 2 (two) times daily.   Social History   Tobacco Use  .  Smoking status: Never Smoker  . Smokeless tobacco: Never Used  Substance Use Topics  . Alcohol use: Yes   Family History  Problem Relation Age of Onset  . Diabetes Mother   . AAA (abdominal aortic aneurysm) Mother 62  . Blindness Mother   . Diabetes Father   . Stroke Father 36  . Heart attack Father   . Drug abuse Sister   . Drug abuse Brother   . Brain cancer Maternal Grandmother 60  . Diabetes Paternal Grandmother   . Stroke Paternal Grandmother   . Diabetes Paternal Grandfather 40     Review of Systems  Constitutional: Negative for chills, fatigue and fever.   HENT: Positive for congestion.   Respiratory: Positive for cough and shortness of breath. Negative for chest tightness and wheezing.   Cardiovascular: Negative for chest pain, palpitations and leg swelling.  Psychiatric/Behavioral: Negative for suicidal ideas. The patient is not nervous/anxious.     Objective:  BP 130/80 (BP Location: Left Arm, Patient Position: Sitting, Cuff Size: Normal)   Pulse 88   Temp 98.1 F (36.7 C) (Oral)   Wt 254 lb 11.2 oz (115.5 kg)   SpO2 93%   BMI 36.55 kg/m   Weight: 254 lb 11.2 oz (115.5 kg)   BP Readings from Last 3 Encounters:  10/13/18 130/80  06/08/18 (!) 151/92  09/26/17 121/82   Wt Readings from Last 3 Encounters:  10/13/18 254 lb 11.2 oz (115.5 kg)  09/25/17 210 lb (95.3 kg)  06/09/17 210 lb (95.3 kg)    Physical Exam Constitutional:      General: He is not in acute distress.    Appearance: He is well-developed.  Cardiovascular:     Rate and Rhythm: Normal rate and regular rhythm.     Heart sounds: Normal heart sounds. No murmur. No friction rub.  Pulmonary:     Effort: Pulmonary effort is normal. No respiratory distress.     Breath sounds: Normal breath sounds. No wheezing or rales.  Musculoskeletal:     Right lower leg: No edema.     Left lower leg: No edema.  Neurological:     Mental Status: He is alert and oriented to person, place, and time.  Psychiatric:        Mood and Affect: Mood normal.        Behavior: Behavior normal.        Thought Content: Thought content normal.     Assessment/Plan: 1. Moderate persistent asthma with acute exacerbation Will send to allergist for help with triggers for asthma exacerbations.  Discussed importance of staying on regular controller inhaler.  He was under using the Advair in the past, which I suspect was leading to recurrent exacerbations.  We will follow closely. - Ambulatory referral to Allergy - fluticasone-salmeterol (ADVAIR HFA) 115-21 MCG/ACT inhaler; Inhale 2 puffs into  the lungs 2 (two) times daily.  Dispense: 1 Inhaler; Refill: 11 - Albuterol Sulfate 108 (90 Base) MCG/ACT AEPB; Inhale 1-2 puffs into the lungs every 4 (four) hours as needed.  Dispense: 1 each; Refill: 2 - Spacer/Aero-Holding Chambers (E-Z SPACER) inhaler; Use as instructed  Dispense: 1 each; Refill: 2  2. Family history of aortic aneurysm Mother with significant aortic aneurysm.  She is not a surgical candidate so this is not been repaired. - VAS [place] AAA DUPLEX; Future  3. Recurrent cold sores Valtrex once daily has worked very well for him in the past to suppress outbreaks. - valACYclovir (VALTREX) 500 MG tablet; Take 1  tablet (500 mg total) by mouth daily.  Dispense: 90 tablet; Refill: 1  4. Pure hypercholesterolemia Consider restarting cholesterol medication pending blood work results. - Lipid panel; Future - TSH; Future - TSH - Lipid panel  5. Essential hypertension Blood pressure today is stable off of medication.  I am not going to restart his medication today.  We will plan to recheck at a follow-up visit. - Comprehensive metabolic panel; Future - CBC with Differential/Platelet; Future - CBC with Differential/Platelet - Comprehensive metabolic panel  6. Elevated fasting glucose - Hemoglobin A1c; Future - Hemoglobin A1c  7. Need for pneumococcal vaccination - Pneumococcal conjugate vaccine 13-valent IM  8. Need for influenza vaccination - Flu Vaccine QUAD 36+ mos IM     Return pending bloodwork.  Micheline Rough, MD

## 2018-10-13 NOTE — Patient Instructions (Signed)
Go to Advair website and look up patient coupon. Print this and bring to pharmacy.

## 2018-10-17 ENCOUNTER — Telehealth: Payer: Self-pay | Admitting: Family Medicine

## 2018-10-17 NOTE — Telephone Encounter (Signed)
°  Copied from Jack [id number]. Topic: General - Other >> Oct 16, 2018  3:25 PM Valla Leaver wrote: Reason for CRM: [facility] rep calling to notify Dr. Ethlyn Gallery that the "VAS [place] AAA DUPLEX" she ordered is not performed at their location.

## 2018-10-18 NOTE — Telephone Encounter (Signed)
I just wanted to order an abdominal aorta scan due to family history of this; could you help me out with proper order?

## 2018-10-26 ENCOUNTER — Telehealth: Payer: Self-pay | Admitting: Family Medicine

## 2018-10-26 NOTE — Telephone Encounter (Signed)
Pt is wanting to know if he needs an OV and Labs or just one or the other?  Please advise.     Notes recorded by [name], MD on 10/13/2018 at 5:03 PM EDT His blood sugar is elevated; not bad but it is putting him just into diabetic range. His thyroid is also sluggish. I would like for him to return to discuss these with me because I think treating both will help with weight loss, energy, etc. Please schedule follow up in next 1-2 weeks. We will review his cholesterol too which was quite elevated. Low carb diet recommended; we will review details when he is back. 30 minutes please.

## 2018-10-27 NOTE — Telephone Encounter (Signed)
Spoke with patient. A webex appointment has been scheduled for Monday.

## 2018-10-27 NOTE — Telephone Encounter (Signed)
Web visit is perfect to review these with him

## 2018-10-29 NOTE — Progress Notes (Signed)
Virtual Visit via Video Note  I connected with@ on 10/30/18 at 11:30 AM EDT by a video enabled telemedicine application and verified that I am speaking with the correct person using two identifiers.  Location patient: home Location provider:work or home office Persons participating in the virtual visit: patient, provider  I discussed the limitations of evaluation and management by telemedicine and the availability of in person appointments. The patient expressed understanding and agreed to proceed.   Terry [name] DOB: 12/15/1970 Encounter date: 10/30/2018  This is a 48 y.o. male who presents with Chief Complaint  Patient presents with  . Diabetes  . Asthma    History of present illness: Asked for patient to come in for discussion of some lab abnormalities: 1. DMII: A1C was 6.5 on recent bloodwork 10/13/18.  2. TSH was 5.95 on bloodwork.   3. WBC slightly elevated.  He feels well overall. States that cholesterol was previously well controlled on medication (he had been off at time of visit)  Lipid Panel     Component Value Date/Time   CHOL 229 (H) 10/13/2018 1108   TRIG (H) 10/13/2018 1108    413.0 Triglyceride is over 400; calculations on Lipids are invalid.   HDL 42.00 10/13/2018 1108   CHOLHDL 5 10/13/2018 1108   LDLDIRECT 130.0 10/13/2018 1108      He states that his breathing has been good overall and he has not had any wheezing.  He had some concerns with cost of Advair inhaler, which was $100.  His wife had found a less expensive lower dose inhaler from [facility], and was wondering about getting prescription through them.  When discussing carbohydrate intake and dietary preferences, he does state that he eats a lot of fast food and drinks a lot of Diet Coke.  He tends to have a diet more heavy in "meat and potatoes".  He has a higher carbohydrate diet.      No Known Allergies No outpatient medications have been marked as taking for the 10/30/18 encounter  (Office Visit) with [name], MD.    Review of Systems  Constitutional: Negative for fatigue.  Respiratory: Negative for cough, shortness of breath and wheezing.     Objective:  There were no vitals taken for this visit.      BP Readings from Last 3 Encounters:  10/13/18 130/80  06/08/18 (!) 151/92  09/26/17 121/82   Wt Readings from Last 3 Encounters:  10/13/18 254 lb 11.2 oz (115.5 kg)  09/25/17 210 lb (95.3 kg)  06/09/17 210 lb (95.3 kg)    EXAM:  GENERAL: alert, oriented, appears well and in no acute distress  HEENT: atraumatic, conjunttiva clear, no obvious abnormalities on inspection of external nose and ears  NECK: normal movements of the head and neck  LUNGS: on inspection no signs of respiratory distress, breathing rate appears normal, no obvious gross SOB, gasping or wheezing  CV: no obvious cyanosis  MS: moves all visible extremities without noticeable abnormality  PSYCH/NEURO: pleasant and cooperative, no obvious depression or anxiety, speech and thought processing grossly intact   Assessment/Plan 1. Moderate persistent asthma with acute exacerbation After looking over pricing options for inhalers within his formulary, I do not think that there is one less expensive than the Advair.  I have written out a list of options which I believe upfront for him which include trying to see if he can get a discount through [facility], which I do not think he will be able  to get better price less than his insurance is currently covering for the same strength of inhaler.  Another option would be to get patient assistance through the [place], for which I have given him this paperwork to complete and return if interested.  A third option would be changing to an inhaler possibly not on his formulary is a lower tier (Breo) since there are manufacturer coupons available for these higher tier inhalers that might end up costing him less than he currently pays.   I also suggested that he check with his insurance company to see if the price will decrease after he meets his deductible. - fluticasone-salmeterol (ADVAIR HFA) 115-21 MCG/ACT inhaler; Inhale 2 puffs into the lungs 2 (two) times daily.  Dispense: 3 Inhaler; Refill: 3  2. Controlled type 2 diabetes mellitus without complication, without long-term current use of insulin ([place]) We discussed that at this point he does not have to start medication, but after discussion he would like to start the metformin to see if it can benefit him in terms of processing carbohydrates, potentially weight loss.  We will start just once a day at this point and plan to recheck an A1c in 3 months time.  Additionally, we will be starting lisinopril 5 mg for renal protection.  We discussed benefits of this.  We additionally discussed lower carbohydrate diet and I have put a packet upfront for him to review carbohydrate choices and carbohydrate servings per meals.  We discussed cutting back on carbohydrates will help with cholesterol as well as blood sugar overall.  We will plan to have a web or office visit in 3 months time to reassess.  3. Hyperlipidemia associated with type 2 diabetes mellitus ([place]) Was not on statin when previous lab work was done, so we will plan just to recheck his blood work in 3 months time.  He states he was not on any additional cholesterol medication in the past, and triglycerides were not as elevated as they were this time, so we are going to work on diet and exercise and if we do not see a significant decrease in triglycerides I would certainly consider adding a fibrate.  4. Hypothyroidism, unspecified type We will plan to recheck a TSH along with free T3 and 4 in 3 months time.  We discussed that sometimes she will see fluctuations in these hormones.  If still hypothyroid in a few months we will start Synthroid.   More than 30 minutes of "face to face time" was spent discussing exercise, diet,  medication options, diabetes management, health management.   I discussed the assessment and treatment plan with the patient. The patient was provided an opportunity to ask questions and all were answered. The patient agreed with the plan and demonstrated an understanding of the instructions.   The patient was advised to call back or seek an in-person evaluation if the symptoms worsen or if the condition fails to improve as anticipated.  Return bloodwork in 3 months.Micheline Rough, MD

## 2018-10-30 ENCOUNTER — Other Ambulatory Visit: Payer: Self-pay

## 2018-10-30 ENCOUNTER — Ambulatory Visit (INDEPENDENT_AMBULATORY_CARE_PROVIDER_SITE_OTHER): Payer: BLUE CROSS/BLUE SHIELD | Admitting: Family Medicine

## 2018-10-30 DIAGNOSIS — E1169 Type 2 diabetes mellitus with other specified complication: Secondary | ICD-10-CM

## 2018-10-30 DIAGNOSIS — E039 Hypothyroidism, unspecified: Secondary | ICD-10-CM

## 2018-10-30 DIAGNOSIS — E119 Type 2 diabetes mellitus without complications: Secondary | ICD-10-CM | POA: Diagnosis not present

## 2018-10-30 DIAGNOSIS — E785 Hyperlipidemia, unspecified: Secondary | ICD-10-CM

## 2018-10-30 DIAGNOSIS — J4541 Moderate persistent asthma with (acute) exacerbation: Secondary | ICD-10-CM

## 2018-10-30 MED ORDER — FLUTICASONE-SALMETEROL 115-21 MCG/ACT IN AERO: 2.0000 | INHALATION_SPRAY | Freq: Two times a day (BID) | RESPIRATORY_TRACT | 3 refills | Status: DC

## 2018-10-30 MED ORDER — METFORMIN HCL ER 500 MG PO TB24
500.0000 mg | ORAL_TABLET | Freq: Every day | ORAL | 1 refills | Status: DC
Start: 2018-10-30 — End: 2019-07-11

## 2018-10-30 MED ORDER — LISINOPRIL 5 MG PO TABS: 5.0000 mg | ORAL_TABLET | Freq: Every day | ORAL | 1 refills | Status: DC

## 2019-04-06 ENCOUNTER — Other Ambulatory Visit: Payer: Self-pay | Admitting: Internal Medicine

## 2019-04-06 ENCOUNTER — Ambulatory Visit
Admission: RE | Admit: 2019-04-06 | Discharge: 2019-04-06 | Disposition: A | Discharge status: Home / Self Care | Payer: BLUE CROSS/BLUE SHIELD | Source: Ambulatory Visit | Attending: Internal Medicine | Admitting: Internal Medicine

## 2019-04-06 ENCOUNTER — Other Ambulatory Visit: Payer: Self-pay

## 2019-04-06 DIAGNOSIS — R7612 Nonspecific reaction to cell mediated immunity measurement of gamma interferon antigen response without active tuberculosis: Secondary | ICD-10-CM

## 2019-06-26 ENCOUNTER — Encounter: Payer: Self-pay | Admitting: Family Medicine

## 2019-06-26 ENCOUNTER — Other Ambulatory Visit: Payer: Self-pay

## 2019-06-26 ENCOUNTER — Ambulatory Visit (INDEPENDENT_AMBULATORY_CARE_PROVIDER_SITE_OTHER): Payer: BC Managed Care – PPO | Admitting: Family Medicine

## 2019-06-26 VITALS — BP 150/94 | HR 65 | Temp 95.7°F | Wt 254.0 lb

## 2019-06-26 DIAGNOSIS — R339 Retention of urine, unspecified: Secondary | ICD-10-CM | POA: Diagnosis not present

## 2019-06-26 DIAGNOSIS — R300 Dysuria: Secondary | ICD-10-CM

## 2019-06-26 DIAGNOSIS — R7611 Nonspecific reaction to tuberculin skin test without active tuberculosis: Secondary | ICD-10-CM | POA: Diagnosis not present

## 2019-06-26 MED ORDER — TAMSULOSIN HCL 0.4 MG PO CAPS: 0.4000 mg | ORAL_CAPSULE | Freq: Every day | ORAL | 3 refills | Status: DC

## 2019-06-26 NOTE — Progress Notes (Signed)
  Subjective:     Patient ID: Terry [name], male   DOB: [birth year], 48 y.o.   MRN: [id number]  HPI  Carman is seen with 3-day history of urge to urinate but some difficulties going.  He states he has history of "bashful bladder "but has had these symptoms even at home.  He states he is having some urination but somewhat of a dribble.  He denies any burning with urination.  No flank pain.  No fevers or chills.  No incontinence symptoms.  He does not take any anticholinergic medications but does take high-dose methadone per methadone clinic-currently 180 mg daily. No history of prostatitis.  No family history of prostate cancer.  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  He was evaluated through the health department.  Chest x-ray unremarkable.  No evidence for active TB.  He is on prophylactic medication.  He contacted the health department and they did not think his prophylactic medications could cause the symptoms.  He has not shown any evidence for darkly colored urine such as bilirubinuria  Past Medical History:  Diagnosis Date  . Asthma   . Hyperlipidemia   . Hypertension   . Opioid abuse ([place])    following with methadone clinic  . Pneumonia    Past Surgical History:  Procedure Laterality Date  . right bicep Right 2013  . TRICEPS TENDON REPAIR Left 04/2013    reports that he has never smoked. He has never used smokeless tobacco. He reports current alcohol use. He reports current drug use. Drug: Marijuana. family history includes AAA (abdominal aortic aneurysm) (age of onset: 61) in his mother; Blindness in his mother; Brain cancer (age of onset: 56) in his maternal grandmother; Diabetes in his father, mother, and paternal grandmother; Diabetes (age of onset: 69) in his paternal grandfather; Drug abuse in his brother and sister; Heart attack in his father; Stroke in his paternal grandmother; Stroke (age of onset: 35) in his father. No Known Allergies  Review of Systems   Constitutional: Negative for chills and fever.  Genitourinary: Positive for decreased urine volume, difficulty urinating and dysuria. Negative for flank pain, frequency and hematuria.       Objective:   Physical Exam Vitals signs reviewed.  Constitutional:      Appearance: He is well-developed.  Cardiovascular:     Rate and Rhythm: Normal rate and regular rhythm.  Genitourinary:    Comments: Prostate exam reveals no tenderness.  No enlargement.  No nodules. Neurological:     Mental Status: He is alert.        Assessment:     #1 decreased urination.  Patient unable to give urine specimen today.  He is able to urinate some and does not have evidence for major obstruction.  We did explain that high-dose methadone can certainly be related to urinary retention.  No evidence for acute prostatitis.  Doubt BPH  #2+ PPD-follow through the health department and patient currently on prophylactic medication    Plan:     -We recommend trial of Flomax 0.4 mg nightly -We recommend urinalysis but he was unable to give specimen.  Doubt infection etiology though -He needs to be in touch with methadone clinic if he continues to have issues as he may be having some urinary retention related to his methadone increase  Eulas Post MD [facility] at [facility]

## 2019-06-26 NOTE — Patient Instructions (Signed)
Try the Flomax and let me know if not seeing some improvement.

## 2019-06-27 ENCOUNTER — Other Ambulatory Visit: Payer: Self-pay

## 2019-06-27 DIAGNOSIS — R339 Retention of urine, unspecified: Secondary | ICD-10-CM

## 2019-06-27 LAB — POCT URINALYSIS DIPSTICK
Glucose, UA: NEGATIVE
Leukocytes, UA: NEGATIVE
Protein, UA: NEGATIVE
Spec Grav, UA: 1.02 (ref 1.010–1.025)
Urobilinogen, UA: 0.2 E.U./dL
pH, UA: 6 (ref 5.0–8.0)

## 2019-06-27 NOTE — Addendum Note (Signed)
Addended by: Eulas Post on: 06/27/2019 03:00 PM   Modules accepted: Orders

## 2019-07-02 ENCOUNTER — Other Ambulatory Visit: Payer: Self-pay

## 2019-07-03 ENCOUNTER — Encounter: Payer: Self-pay | Admitting: Family Medicine

## 2019-07-03 ENCOUNTER — Other Ambulatory Visit: Payer: Self-pay

## 2019-07-03 ENCOUNTER — Ambulatory Visit (INDEPENDENT_AMBULATORY_CARE_PROVIDER_SITE_OTHER): Payer: BC Managed Care – PPO | Admitting: Family Medicine

## 2019-07-03 VITALS — BP 142/84 | HR 82 | Temp 95.3°F | Ht 69.0 in | Wt 248.5 lb

## 2019-07-03 DIAGNOSIS — G2581 Restless legs syndrome: Secondary | ICD-10-CM | POA: Diagnosis not present

## 2019-07-03 DIAGNOSIS — F5104 Psychophysiologic insomnia: Secondary | ICD-10-CM

## 2019-07-03 DIAGNOSIS — R5383 Other fatigue: Secondary | ICD-10-CM | POA: Diagnosis not present

## 2019-07-03 MED ORDER — PRAMIPEXOLE DIHYDROCHLORIDE 0.125 MG PO TABS: ORAL_TABLET | ORAL | 3 refills | Status: DC

## 2019-07-03 NOTE — Patient Instructions (Signed)

## 2019-07-03 NOTE — Progress Notes (Signed)
Subjective:     Patient ID: Terry [name], male   DOB: January 21, 1971, 48 y.o.   MRN: [id number]  HPI Terry [name] today wishes to discuss the following items  Recent concern for possible "UTI ".  His urine dipstick was unremarkable.  He was describing what sounded like some urinary hesitancy and possible mild retention.  He is on very high-dose methadone and explained that very likely may be contributing.  Nevertheless, he did describe some possible mild obstructive symptoms.  We started Flomax and he has seen some improvement.  He is also trying to scale back his caffeine use.  There was no evidence for acute prostatitis by exam at last visit  1 major concern is he describes restless leg type symptoms He has multiple symptoms involving both legs occurring predominantly at night including tingling, prickling, throbbing, crawling, burning sensation.  These frequently make it difficult to get to sleep or waking him up.  They are usually improved slightly with ambulation.  Clearly worse at night.  As above, trying to scale back caffeine use.  No history of known anemia.  He is concerned about insomnia issues.  Apparently took trazodone at one point in the past.  He feels that he is having some insomnia even apart from the restless leg symptoms.  Also complains of excessive fatigue and low libido and erectile dysfunction.  He is concerned about low T which we explained is very likely with his high-dose methadone therapy  Past Medical History:  Diagnosis Date  . Asthma   . Hyperlipidemia   . Hypertension   . Opioid abuse ([place])    following with methadone clinic  . Pneumonia    Past Surgical History:  Procedure Laterality Date  . right bicep Right 2013  . TRICEPS TENDON REPAIR Left 04/2013    reports that he has never smoked. He has never used smokeless tobacco. He reports current alcohol use. He reports current drug use. Drug: Marijuana. family history includes AAA (abdominal aortic aneurysm)  (age of onset: 89) in his mother; Blindness in his mother; Brain cancer (age of onset: 73) in his maternal grandmother; Diabetes in his father, mother, and paternal grandmother; Diabetes (age of onset: 84) in his paternal grandfather; Drug abuse in his brother and sister; Heart attack in his father; Stroke in his paternal grandmother; Stroke (age of onset: 15) in his father. No Known Allergies   Review of Systems  Constitutional: Positive for fatigue. Negative for appetite change and unexpected weight change.  Respiratory: Negative for cough and shortness of breath.   Cardiovascular: Negative for chest pain, palpitations and leg swelling.  Neurological: Negative for dizziness, tremors, speech difficulty, weakness and headaches.       Objective:   Physical Exam Vitals signs reviewed.  Constitutional:      Appearance: Normal appearance.  Cardiovascular:     Rate and Rhythm: Normal rate and regular rhythm.  Pulmonary:     Effort: Pulmonary effort is normal.     Breath sounds: Normal breath sounds.  Neurological:     General: No focal deficit present.     Mental Status: He is alert.        Assessment:     #1 recent urinary symptoms of hesitancy and possible mild retention.  Suspect predominantly related to high-dose methadone therapy.  We reiterated that he did not show any signs of infection by recent urine dipstick  #2 restless leg symptoms.  These have been progressive for several months.  Does consume a lot  of caffeine.  No history of anemia  #3 fatigue with decreased libido.  Very high likelihood of low testosterone    Plan:     -Continue caffeine reduction -Discussed options for restless legs.  I recommend a trial of Mirapex 0.125 mg 1-2 nightly as needed -Patient requesting consideration for trazodone.  We explained would like to try the Mirapex first to see how much that may help with his insomnia issues -He was requesting testosterone level.  We were seeing him around  12 noon and we have explained that we would not recommend checking screening testosterone at that time of day.  He will discuss this with primary -We recommend follow-up appointment with primary in about 1 month to reassess -Would avoid medications such as Klonopin for his restless leg symptoms given his high-dose methadone therapy  Eulas Post MD [facility] at [facility]

## 2019-07-10 ENCOUNTER — Other Ambulatory Visit: Payer: Self-pay

## 2019-07-11 ENCOUNTER — Ambulatory Visit (INDEPENDENT_AMBULATORY_CARE_PROVIDER_SITE_OTHER): Payer: BC Managed Care – PPO | Admitting: Family Medicine

## 2019-07-11 ENCOUNTER — Encounter: Payer: Self-pay | Admitting: Family Medicine

## 2019-07-11 VITALS — BP 108/80 | HR 82 | Temp 97.2°F | Ht 69.0 in | Wt 253.5 lb

## 2019-07-11 DIAGNOSIS — E039 Hypothyroidism, unspecified: Secondary | ICD-10-CM

## 2019-07-11 DIAGNOSIS — E1169 Type 2 diabetes mellitus with other specified complication: Secondary | ICD-10-CM | POA: Diagnosis not present

## 2019-07-11 DIAGNOSIS — G2581 Restless legs syndrome: Secondary | ICD-10-CM

## 2019-07-11 DIAGNOSIS — F1129 Opioid dependence with unspecified opioid-induced disorder: Secondary | ICD-10-CM

## 2019-07-11 DIAGNOSIS — R6882 Decreased libido: Secondary | ICD-10-CM | POA: Diagnosis not present

## 2019-07-11 DIAGNOSIS — E1165 Type 2 diabetes mellitus with hyperglycemia: Secondary | ICD-10-CM

## 2019-07-11 DIAGNOSIS — R350 Frequency of micturition: Secondary | ICD-10-CM | POA: Diagnosis not present

## 2019-07-11 DIAGNOSIS — E785 Hyperlipidemia, unspecified: Secondary | ICD-10-CM

## 2019-07-11 DIAGNOSIS — I10 Essential (primary) hypertension: Secondary | ICD-10-CM

## 2019-07-11 LAB — COMPREHENSIVE METABOLIC PANEL
ALT: 22 U/L (ref 0–53)
AST: 21 U/L (ref 0–37)
Albumin: 4.1 g/dL (ref 3.5–5.2)
Alkaline Phosphatase: 59 U/L (ref 39–117)
BUN: 15 mg/dL (ref 6–23)
CO2: 31 mEq/L (ref 19–32)
Calcium: 9.2 mg/dL (ref 8.4–10.5)
Chloride: 100 mEq/L (ref 96–112)
Creatinine, Ser: 0.99 mg/dL (ref 0.40–1.50)
GFR: 81.37 mL/min (ref >60.00)
Glucose, Bld: 105 mg/dL — ABNORMAL HIGH (ref 70–99)
Potassium: 4.7 mEq/L (ref 3.5–5.1)
Sodium: 137 mEq/L (ref 135–145)
Total Bilirubin: 0.5 mg/dL (ref 0.2–1.2)
Total Protein: 6.3 g/dL (ref 6.0–8.3)

## 2019-07-11 LAB — CBC WITH DIFFERENTIAL/PLATELET
Basophils Absolute: 0.1 10*3/uL (ref 0.0–0.1)
Basophils Relative: 0.8 % (ref 0.0–3.0)
Eosinophils Absolute: 0.6 10*3/uL (ref 0.0–0.7)
Eosinophils Relative: 5.2 % — ABNORMAL HIGH (ref 0.0–5.0)
HCT: 43.4 % (ref 39.0–52.0)
Hemoglobin: 14.2 g/dL (ref 13.0–17.0)
Lymphocytes Relative: 44.2 % (ref 12.0–46.0)
Lymphs Abs: 5.2 10*3/uL — ABNORMAL HIGH (ref 0.7–4.0)
MCHC: 32.8 g/dL (ref 30.0–36.0)
MCV: 86.1 fl (ref 78.0–100.0)
Monocytes Absolute: 0.6 10*3/uL (ref 0.1–1.0)
Monocytes Relative: 4.9 % (ref 3.0–12.0)
Neutro Abs: 5.3 10*3/uL (ref 1.4–7.7)
Neutrophils Relative %: 44.9 % (ref 43.0–77.0)
Platelets: 357 10*3/uL (ref 150.0–400.0)
RBC: 5.04 Mil/uL (ref 4.22–5.81)
RDW: 12.8 % (ref 11.5–15.5)
WBC: 11.8 10*3/uL — ABNORMAL HIGH (ref 4.0–10.5)

## 2019-07-11 LAB — MICROALBUMIN / CREATININE URINE RATIO
Creatinine,U: 88.2 mg/dL
Microalb Creat Ratio: 0.8 mg/g (ref 0.0–30.0)
Microalb, Ur: <0.7 mg/dL (ref 0.0–1.9)

## 2019-07-11 LAB — T3, FREE: T3, Free: 3.8 pg/mL (ref 2.3–4.2)

## 2019-07-11 LAB — LDL CHOLESTEROL, DIRECT: Direct LDL: 132 mg/dL

## 2019-07-11 LAB — HEMOGLOBIN A1C: Hgb A1c MFr Bld: 6.6 % — ABNORMAL HIGH (ref 4.6–6.5)

## 2019-07-11 LAB — LIPID PANEL
Cholesterol: 208 mg/dL — ABNORMAL HIGH (ref 0–200)
HDL: 37.2 mg/dL — ABNORMAL LOW (ref >39.00)
NonHDL: 170.92
Total CHOL/HDL Ratio: 6
Triglycerides: 387 mg/dL — ABNORMAL HIGH (ref 0.0–149.0)
VLDL: 77.4 mg/dL — ABNORMAL HIGH (ref 0.0–40.0)

## 2019-07-11 LAB — PSA: PSA: 0.39 ng/mL (ref 0.10–4.00)

## 2019-07-11 LAB — T4, FREE: Free T4: 0.76 ng/dL (ref 0.60–1.60)

## 2019-07-11 LAB — TESTOSTERONE: Testosterone: 150.02 ng/dL — ABNORMAL LOW (ref 300.00–890.00)

## 2019-07-11 LAB — TSH: TSH: 2.27 u[IU]/mL (ref 0.35–4.50)

## 2019-07-11 MED ORDER — TRAZODONE HCL 50 MG PO TABS: 50.0000 mg | ORAL_TABLET | Freq: Every evening | ORAL | 1 refills | Status: DC | PRN

## 2019-07-11 MED ORDER — METFORMIN HCL ER 500 MG PO TB24: 500.0000 mg | ORAL_TABLET | Freq: Every day | ORAL | 1 refills | Status: DC

## 2019-07-11 MED ORDER — PRAVASTATIN SODIUM 40 MG PO TABS: 40.0000 mg | ORAL_TABLET | Freq: Every day | ORAL | 1 refills | Status: DC

## 2019-07-11 NOTE — Progress Notes (Signed)
Terry [name] DOB: Jul 22, 1971 Encounter date: 07/11/2019  This is a 48 y.o. male who presents with Chief Complaint  Patient presents with  . Follow-up    History of present illness: Was seen about 2 weeks ago with concerns for restless legs, insomnia, and concern for low testosterone.  Forgot about sugar medication, so wasn't checking this. Wasn't taking cholesterol medication (thought he was) but when he got a refill it was on the bp medication. - Was taking double blood pressure medication and not taking cholesterol. Thinks he has been out of cholesterol medication for a few months now.   States that medication for restless legs feels like it is messing up his stomach - diarrhea, upset. Not helping with rest - up 8 times/night. During the day has difficult with urination, but at night he is getting up every hour and a half to urinate. Hasn't been sleeping for awhile. Got a job where he is working 7a-7p and then switches from 7p-7a after 6 weeks. This has been a hard change for him. Has tried melatonin. Helps him go to sleep for an hour or an hour and a half. Tried to re-dose when he is waking up but it just makes him groggy. Has tried trazodone in past and this has worked well for him.   Has been trying to cut back on caffeine - was doing 6-7 diet sodas and down to 3-4. Still working on this.   Has put on a lot of weight. Was 170-180 and now feels muscle loss, poor energy, poor libido, weight gain. Has had difficulty getting and maintaining erections for a few years. Doesn't last as long when getting. Not always morning erection.   Emptying bladder seems slower during the day.   Went back to the 5mg  of lisinopril 2 days ago.   We briefly discussed his sobriety.  He states that things have been quite difficult with recent loss of his mother a couple months ago secondary to Covid.  He tries to distract himself from cravings by staying busy.  States that it has been hard.   No Known  Allergies Current Meds  Medication Sig  . Albuterol Sulfate 108 (90 Base) MCG/ACT AEPB Inhale 1-2 puffs into the lungs every 4 (four) hours as needed.  . fluticasone-salmeterol (ADVAIR HFA) 115-21 MCG/ACT inhaler Inhale 2 puffs into the lungs 2 (two) times daily.  Marland Kitchen lisinopril (PRINIVIL,ZESTRIL) 5 MG tablet Take 1 tablet (5 mg total) by mouth daily.  . metFORMIN (GLUCOPHAGE-XR) 500 MG 24 hr tablet Take 1 tablet (500 mg total) by mouth daily with breakfast.  . methadone (DOLOPHINE) 10 MG/ML solution Take 85 mg by mouth daily.   . pravastatin (PRAVACHOL) 40 MG tablet Take 1 tablet (40 mg total) by mouth daily.  Marland Kitchen Spacer/Aero-Holding Chambers (E-Z SPACER) inhaler Use as instructed  . tamsulosin (FLOMAX) 0.4 MG CAPS capsule Take 1 capsule (0.4 mg total) by mouth daily.  . valACYclovir (VALTREX) 500 MG tablet Take 1 tablet (500 mg total) by mouth daily.  . [DISCONTINUED] metFORMIN (GLUCOPHAGE-XR) 500 MG 24 hr tablet Take 1 tablet (500 mg total) by mouth daily with breakfast.  . [DISCONTINUED] pramipexole (MIRAPEX) 0.125 MG tablet Take one to two at night for restless leg symptoms  . [DISCONTINUED] pravastatin (PRAVACHOL) 40 MG tablet Take 1 tablet (40 mg total) by mouth daily.    Review of Systems  Objective:  BP 108/80 (BP Location: Left Arm, Patient Position: Sitting, Cuff Size: Large)   Pulse 82  Temp (!) 97.2 F (36.2 C) (Temporal)   Ht 5\' 9"  (1.753 m)   Wt 253 lb 8 oz (115 kg)   SpO2 97%   BMI 37.44 kg/m   Weight: 253 lb 8 oz (115 kg)   BP Readings from Last 3 Encounters:  07/11/19 108/80  07/03/19 (!) 142/84  06/26/19 (!) 150/94   Wt Readings from Last 3 Encounters:  07/11/19 253 lb 8 oz (115 kg)  07/03/19 248 lb 8 oz (112.7 kg)  06/26/19 254 lb (115.2 kg)    Physical Exam Constitutional:      General: He is not in acute distress.    Appearance: He is well-developed. He is diaphoretic.  HENT:     Mouth/Throat:     Mouth: Mucous membranes are moist.     Comments:  Small oropharynx Eyes:     Pupils: Pupils are equal, round, and reactive to light.     Comments: Smaller, pinpoint pupils  Cardiovascular:     Rate and Rhythm: Normal rate and regular rhythm.     Heart sounds: Normal heart sounds. No murmur. No friction rub.  Pulmonary:     Effort: Pulmonary effort is normal. No respiratory distress.     Breath sounds: Normal breath sounds. No wheezing or rales.  Musculoskeletal:     Right lower leg: No edema.     Left lower leg: No edema.  Neurological:     Mental Status: He is alert and oriented to person, place, and time.  Psychiatric:        Behavior: Behavior normal.     Assessment/Plan  1. Restless legs We will add on some additional bloodwork. He had stomach upset on the Mirapex.  Future suggestions pending blood work results.  2. Decreased libido This is a relatively newer problem.  Could be related to medications, blood pressure, multifactorial.  We will start with blood work discussed treatment pending these results. - Testosterone; Future - PSA; Future - PSA - Testosterone  3. Hypothyroidism, unspecified type Hypothyroidism noted previously on lab work.  Will recheck. - TSH; Future - T4, free; Future - T3, Free; Future - T3, Free - T4, free - TSH  4. Controlled type 2 diabetes mellitus with hyperglycemia, without long-term current use of insulin (HCC) - Hemoglobin A1c; Future - Microalbumin / creatinine urine ratio; Future - Hemoglobin A1c - Microalbumin / creatinine urine ratio  5. Essential hypertension Well-controlled.  Continue current medication. - Comprehensive metabolic panel; Future - CBC with Differential/Platelet; Future - CBC with Differential/Platelet - Comprehensive metabolic panel  6. Hyperlipidemia associated with type 2 diabetes mellitus ([place]) Has not been taking for the last few months due to medication confusion.  We will recheck baseline. - Lipid panel; Future - pravastatin (PRAVACHOL) 40 MG  tablet; Take 1 tablet (40 mg total) by mouth daily.  Dispense: 90 tablet; Refill: 1 - Lipid panel  7. Urinary frequency - Culture, Urine; Future - Culture, Urine  8. Opioid dependence with opioid-induced disorder ([place]) We discussed options at this point.  He is interested in therapy and this is more appealing to him than doing Narcotics Anonymous or group sessions.  I gave him number as well as Lanny Hurst Cottle's name.  Follow-up on this at next visit.    Return for pending bloodwork.     Micheline Rough, MD

## 2019-07-12 ENCOUNTER — Other Ambulatory Visit: Payer: Self-pay | Admitting: Family Medicine

## 2019-07-12 ENCOUNTER — Encounter: Payer: Self-pay | Admitting: Family Medicine

## 2019-07-12 DIAGNOSIS — D72829 Elevated white blood cell count, unspecified: Secondary | ICD-10-CM

## 2019-07-12 DIAGNOSIS — F1120 Opioid dependence, uncomplicated: Secondary | ICD-10-CM | POA: Insufficient documentation

## 2019-07-12 DIAGNOSIS — R0683 Snoring: Secondary | ICD-10-CM

## 2019-07-12 DIAGNOSIS — R5383 Other fatigue: Secondary | ICD-10-CM

## 2019-07-13 LAB — URINE CULTURE
MICRO NUMBER:: 1180671
Result:: NO GROWTH
SPECIMEN QUALITY:: ADEQUATE

## 2019-07-16 ENCOUNTER — Telehealth: Payer: Self-pay | Admitting: Hematology and Oncology

## 2019-07-16 ENCOUNTER — Telehealth: Payer: Self-pay | Admitting: Family Medicine

## 2019-07-16 NOTE — Telephone Encounter (Signed)
Terry [name] has been cld and scheduled to see Dr. Lorenso Courier on 12/23 at [place]. Pt aware to arrive 15 minutes early. I notified the referring office bc the pt stated he wasn't aware of a hematology referral being made.

## 2019-07-16 NOTE — Telephone Encounter (Signed)
See results note. 

## 2019-07-16 NOTE — Telephone Encounter (Signed)
Patient is calling back for lab results. Patient received a call from the cancer center and is not sure if this is related to his lab work. Please advise [phone]

## 2019-07-16 NOTE — Telephone Encounter (Signed)
Message routed to PCP CMA  

## 2019-07-25 ENCOUNTER — Encounter: Payer: Self-pay | Admitting: Family Medicine

## 2019-07-25 ENCOUNTER — Inpatient Hospital Stay: Payer: BC Managed Care – PPO

## 2019-07-25 ENCOUNTER — Telehealth (INDEPENDENT_AMBULATORY_CARE_PROVIDER_SITE_OTHER): Payer: BC Managed Care – PPO | Admitting: Family Medicine

## 2019-07-25 ENCOUNTER — Encounter: Payer: Self-pay | Admitting: *Deleted

## 2019-07-25 ENCOUNTER — Telehealth: Payer: Self-pay | Admitting: *Deleted

## 2019-07-25 ENCOUNTER — Other Ambulatory Visit: Payer: Self-pay

## 2019-07-25 ENCOUNTER — Inpatient Hospital Stay: Payer: BC Managed Care – PPO | Attending: Hematology and Oncology | Admitting: Hematology and Oncology

## 2019-07-25 VITALS — BP 134/86 | HR 65 | Temp 97.8°F | Resp 20 | Wt 252.5 lb

## 2019-07-25 VITALS — BP 134/88 | Temp 98.0°F | Wt 252.0 lb

## 2019-07-25 DIAGNOSIS — D72820 Lymphocytosis (symptomatic): Secondary | ICD-10-CM

## 2019-07-25 DIAGNOSIS — R7989 Other specified abnormal findings of blood chemistry: Secondary | ICD-10-CM | POA: Diagnosis not present

## 2019-07-25 DIAGNOSIS — E785 Hyperlipidemia, unspecified: Secondary | ICD-10-CM | POA: Diagnosis not present

## 2019-07-25 DIAGNOSIS — I10 Essential (primary) hypertension: Secondary | ICD-10-CM

## 2019-07-25 DIAGNOSIS — J45909 Unspecified asthma, uncomplicated: Secondary | ICD-10-CM | POA: Diagnosis not present

## 2019-07-25 DIAGNOSIS — Z79899 Other long term (current) drug therapy: Secondary | ICD-10-CM | POA: Diagnosis not present

## 2019-07-25 DIAGNOSIS — F1110 Opioid abuse, uncomplicated: Secondary | ICD-10-CM | POA: Insufficient documentation

## 2019-07-25 DIAGNOSIS — E119 Type 2 diabetes mellitus without complications: Secondary | ICD-10-CM

## 2019-07-25 LAB — CBC WITH DIFFERENTIAL (CANCER CENTER ONLY)
Abs Immature Granulocytes: 0.04 10*3/uL (ref 0.00–0.07)
Basophils Absolute: 0.1 10*3/uL (ref 0.0–0.1)
Basophils Relative: 1 %
Eosinophils Absolute: 0.5 10*3/uL (ref 0.0–0.5)
Eosinophils Relative: 4 %
HCT: 45.1 % (ref 39.0–52.0)
Hemoglobin: 14.9 g/dL (ref 13.0–17.0)
Immature Granulocytes: 0 %
Lymphocytes Relative: 39 %
Lymphs Abs: 5.2 10*3/uL — ABNORMAL HIGH (ref 0.7–4.0)
MCH: 27.9 pg (ref 26.0–34.0)
MCHC: 33 g/dL (ref 30.0–36.0)
MCV: 84.5 fL (ref 80.0–100.0)
Monocytes Absolute: 0.5 10*3/uL (ref 0.1–1.0)
Monocytes Relative: 4 %
Neutro Abs: 7.1 10*3/uL (ref 1.7–7.7)
Neutrophils Relative %: 52 %
Platelet Count: 348 10*3/uL (ref 150–400)
RBC: 5.34 MIL/uL (ref 4.22–5.81)
RDW: 11.9 % (ref 11.5–15.5)
WBC Count: 13.4 10*3/uL — ABNORMAL HIGH (ref 4.0–10.5)
nRBC: 0 % (ref 0.0–0.2)

## 2019-07-25 LAB — CMP (CANCER CENTER ONLY)
ALT: 22 U/L (ref 0–44)
AST: 16 U/L (ref 15–41)
Albumin: 3.9 g/dL (ref 3.5–5.0)
Alkaline Phosphatase: 61 U/L (ref 38–126)
Anion gap: 9 (ref 5–15)
BUN: 17 mg/dL (ref 6–20)
CO2: 27 mmol/L (ref 22–32)
Calcium: 9 mg/dL (ref 8.9–10.3)
Chloride: 102 mmol/L (ref 98–111)
Creatinine: 0.98 mg/dL (ref 0.61–1.24)
GFR, Est AFR Am: >60 mL/min (ref >60)
GFR, Estimated: >60 mL/min (ref >60)
Glucose, Bld: 117 mg/dL — ABNORMAL HIGH (ref 70–99)
Potassium: 4.5 mmol/L (ref 3.5–5.1)
Sodium: 138 mmol/L (ref 135–145)
Total Bilirubin: 0.4 mg/dL (ref 0.3–1.2)
Total Protein: 6.9 g/dL (ref 6.5–8.1)

## 2019-07-25 LAB — HEPATITIS B SURFACE ANTIGEN: Hepatitis B Surface Ag: NONREACTIVE

## 2019-07-25 LAB — TSH: TSH: 2.951 u[IU]/mL (ref 0.320–4.118)

## 2019-07-25 LAB — HEPATITIS B CORE ANTIBODY, TOTAL: Hep B Core Total Ab: NONREACTIVE

## 2019-07-25 LAB — HEPATITIS B SURFACE ANTIBODY,QUALITATIVE: Hep B S Ab: NONREACTIVE

## 2019-07-25 LAB — SEDIMENTATION RATE: Sed Rate: 0 mm/hr (ref 0–16)

## 2019-07-25 LAB — LACTATE DEHYDROGENASE: LDH: 132 U/L (ref 98–192)

## 2019-07-25 LAB — C-REACTIVE PROTEIN: CRP: 0.6 mg/dL (ref <1.0)

## 2019-07-25 LAB — SAVE SMEAR(SSMR), FOR PROVIDER SLIDE REVIEW

## 2019-07-25 MED ORDER — XYOSTED 75 MG/0.5ML ~~LOC~~ SOAJ: 75.0000 mg | SUBCUTANEOUS | 2 refills | Status: DC

## 2019-07-25 NOTE — Telephone Encounter (Signed)
-----   Message from [name], MD sent at 07/25/2019  2:59 PM EST ----- Please call and set up lab visit on or after 10/09/19.Please pass along below info for getting savings card for xyosted:Xyosted testosterone self-auto injectorIn order to get savings care, go to [email or web address]#scroll to "save with a copay card" button and click#click to activate your steadycare card#click "need a card" button and follow prompts##I Have already sent rx for the testosterone to pharmacy so he will have to present copay card from website. If it's not covered or if too pricey let me know and we will try something else.

## 2019-07-25 NOTE — Progress Notes (Signed)
Virtual Visit via Video Note  I connected with Terry [name] on 07/26/19 at  2:30 PM EST by a video enabled telemedicine application and verified that I am speaking with the correct person using two identifiers.  Location patient: home Location provider:work or home office Persons participating in the virtual visit: patient, provider  I discussed the limitations of evaluation and management by telemedicine and the availability of in person appointments. The patient expressed understanding and agreed to proceed.   Terry [name] DOB: August 30, 1970 Encounter date: 07/25/2019  This is a 48 y.o. male who presents with Chief Complaint  Patient presents with  . Follow-up    discuss labs    History of present illness:  Saw hematology today: enjoyed visit and had a lot of bloodwork; he will get back in touch with results.   Urination seems to be doing better. Thinks that the flomax has maybe helped.  Also has started Metformin since completing last blood work due to elevated A1c.   No Known Allergies Current Meds  Medication Sig  . Albuterol Sulfate 108 (90 Base) MCG/ACT AEPB Inhale 1-2 puffs into the lungs every 4 (four) hours as needed.  . fluticasone-salmeterol (ADVAIR HFA) 115-21 MCG/ACT inhaler Inhale 2 puffs into the lungs 2 (two) times daily.  Marland Kitchen lisinopril (PRINIVIL,ZESTRIL) 5 MG tablet Take 1 tablet (5 mg total) by mouth daily.  . metFORMIN (GLUCOPHAGE-XR) 500 MG 24 hr tablet Take 1 tablet (500 mg total) by mouth daily with breakfast.  . methadone (DOLOPHINE) 10 MG/ML solution Take 85 mg by mouth daily.   . pravastatin (PRAVACHOL) 40 MG tablet Take 1 tablet (40 mg total) by mouth daily.  Marland Kitchen Spacer/Aero-Holding Chambers (E-Z SPACER) inhaler Use as instructed  . tamsulosin (FLOMAX) 0.4 MG CAPS capsule Take 1 capsule (0.4 mg total) by mouth daily.  . traZODone (DESYREL) 50 MG tablet Take 1 tablet (50 mg total) by mouth at bedtime as needed for sleep.  . valACYclovir  (VALTREX) 500 MG tablet Take 1 tablet (500 mg total) by mouth daily.    Review of Systems  Constitutional: Negative for chills, fatigue and fever.  Respiratory: Negative for cough, chest tightness, shortness of breath and wheezing.   Cardiovascular: Negative for chest pain, palpitations and leg swelling.    Objective:  BP 134/88   Temp 98 F (36.7 C)   Wt 252 lb (114.3 kg)   BMI 37.21 kg/m   Weight: 252 lb (114.3 kg)   BP Readings from Last 3 Encounters:  07/25/19 134/88  07/25/19 134/86  07/11/19 108/80   Wt Readings from Last 3 Encounters:  07/25/19 252 lb (114.3 kg)  07/25/19 252 lb 8 oz (114.5 kg)  07/11/19 253 lb 8 oz (115 kg)    EXAM:  GENERAL: alert, oriented, appears well and in no acute distress  HEENT: atraumatic, conjunctiva clear, no obvious abnormalities on inspection of external nose and ears  NECK: normal movements of the head and neck  LUNGS: on inspection no signs of respiratory distress, breathing rate appears normal, no obvious gross SOB, gasping or wheezing  CV: no obvious cyanosis  MS: moves all visible extremities without noticeable abnormality  PSYCH/NEURO: pleasant and cooperative, no obvious depression or anxiety, speech and thought processing grossly intact  Assessment/Plan  1. Low testosterone in male He is interested in testosterone replacement.  We discussed pros and cons of this including risk of prostate cancer.  We discussed different types of testosterone replacement, and if affordable weekly home injections are preferred  to him.  We will plan to recheck testosterone levels in 3 months time. - Testosterone; Future  2. Controlled type 2 diabetes mellitus without complication, without long-term current use of insulin ([place]) We had a discussion about carbohydrate counting and we will be mailing him a diabetic handout.  We discussed limiting carb choices to 3 carbs per meal and working on regular activity to help control blood sugar  level.  We will recheck A1c in 3 months time.  He is already on statin as well as ACE inhibitor from a preventative standpoint. - Hemoglobin A1c; Future  3. Essential hypertension We will continue to monitor.  Continue current medications.    Return for for bloodwork in 3 mo time.   I discussed the assessment and treatment plan with the patient. The patient was provided an opportunity to ask questions and all were answered. The patient agreed with the plan and demonstrated an understanding of the instructions.  More than half the visit was spent in counseling about diabetic diet and blood sugar control.   The patient was advised to call back or seek an in-person evaluation if the symptoms worsen or if the condition fails to improve as anticipated.  I provided 23 minutes of non-face-to-face time during this encounter.   Terry Rough, MD

## 2019-07-25 NOTE — Telephone Encounter (Signed)
Spoke with the pt and informed him of the message below.  Patient stated he will call back for a lab appointment.  Letter completed and left at the front desk with copay card info as below.

## 2019-07-25 NOTE — Progress Notes (Signed)
[place] Telephone:([phone]) [phone]   Fax:([phone]) [place] NOTE  Patient Care Team: [name], MD as PCP - General (Family Medicine)  Hematological/Oncological History  #Leukocytosis 1) 09/26/2017: WBC 7.7, Hgb 13.1, Plt 247. MCV 84.2. ANC 8600, ALC 1500 2) 10/13/2018: WBC 11.9, Hgb 13.8, Plt 342. MCV 84.7. [place] 5200, ALC 5000 3) 07/11/2019: WBC 11.8, Hgb 14.2, Plt 357, MCV 86.1. [place] 5300, ALC 5200 4) 07/25/2019: establish care with Dr. Lorenso Courier   CHIEF COMPLAINTS/PURPOSE OF CONSULTATION:  Elevated WBC count   HISTORY OF PRESENTING ILLNESS:  Terry [name] 48 y.o. male with medical history significant for HTN, HLD and opioid abuse in methadone clinic who presents for evaluation of leukocytosis.  On review of prior records Terry [name] has an elevation in his white blood cell count dating back to 10/13/2018. At that time was elevated to 11.9 with a neutrophilic predominance. Of note he does have an elevated absolute lymphocyte count at 5000. His last normal was recorded on 09/26/2017 at which time he was noted to have a white blood cell count of 7.7 and an ANC of 8600 with an ALC of 1500. It is apparent in the interim he has had development of what appears to be an overall increase in his white count with increase in lymphocytes.  On exam today Terry [name] notes that he feels well. He reports that he has just finished 12 weeks of medication for TB and was told he did not have an active TB infection. He reports that his nurse from the health department came to his house and administered him the medication during this time. He reports he had no side effects on his medications other than turning his urine bright yellow. He also has been having some urinary symptoms but this has resolved with initiation of Flomax.  On further discussion he reports that he is currently taking methadone therapy for opioid addiction. He reports that he has never used any IV drugs and  that he had only been previously using oral opioids. He has tested negative for HIV in the past and reports that he is an active plasma donor and most recently donated this week. As such he has likely been tested for hematological viruses including hepatitis B and C. He notes that he does occasionally have night sweats but no chills. He reports that with his methadone he does have some constipation, but that with p.o. magnesium this has improved. He has recently had a ringworm infection on his thighs bilaterally, however this resolved with topical antifungal therapy. Terry [name] also reports that he is a never smoker and does not drink alcohol. He is to be very into fitness doing martial arts, however with initiation of the methadone he has gained 100 pounds in the last 2 to 3 years. He is not currently taking any supplements over-the-counter or testosterone boosters. A full 10 point ROS is listed below.  MEDICAL HISTORY:  Past Medical History:  Diagnosis Date  . Asthma   . Hyperlipidemia   . Hypertension   . Opioid abuse ([place])    following with methadone clinic  . Pneumonia     SURGICAL HISTORY: Past Surgical History:  Procedure Laterality Date  . right bicep Right 2013  . TRICEPS TENDON REPAIR Left 04/2013    SOCIAL HISTORY: Social History   Socioeconomic History  . Marital status: Married    Spouse name: Not on file  . Number of children: Not on file  . Years  of education: Not on file  . Highest education level: Not on file  Occupational History  . Not on file  Tobacco Use  . Smoking status: Never Smoker  . Smokeless tobacco: Never Used  Substance and Sexual Activity  . Alcohol use: Yes  . Drug use: Yes    Types: Marijuana  . Sexual activity: Yes  Other Topics Concern  . Not on file  Social History Narrative  . Not on file   Social Determinants of Health   Financial Resource Strain:   . Difficulty of Paying Living Expenses: Not on file  Food Insecurity:   . Worried  About Charity fundraiser in the Last Year: Not on file  . Ran Out of Food in the Last Year: Not on file  Transportation Needs:   . Lack of Transportation (Medical): Not on file  . Lack of Transportation (Non-Medical): Not on file  Physical Activity:   . Days of Exercise per Week: Not on file  . Minutes of Exercise per Session: Not on file  Stress:   . Feeling of Stress : Not on file  Social Connections:   . Frequency of Communication with Friends and Family: Not on file  . Frequency of Social Gatherings with Friends and Family: Not on file  . Attends Religious Services: Not on file  . Active Member of Clubs or Organizations: Not on file  . Attends Archivist Meetings: Not on file  . Marital Status: Not on file  Intimate Partner Violence:   . Fear of Current or Ex-Partner: Not on file  . Emotionally Abused: Not on file  . Physically Abused: Not on file  . Sexually Abused: Not on file    FAMILY HISTORY: Family History  Problem Relation Age of Onset  . Diabetes Mother   . AAA (abdominal aortic aneurysm) Mother 51  . Blindness Mother   . Diabetes Father   . Stroke Father 32  . Heart attack Father   . Drug abuse Sister   . Drug abuse Brother   . Brain cancer Maternal Grandmother 60  . Diabetes Paternal Grandmother   . Stroke Paternal Grandmother   . Diabetes Paternal Grandfather 14    ALLERGIES:  has No Known Allergies.  MEDICATIONS:  Current Outpatient Medications  Medication Sig Dispense Refill  . Albuterol Sulfate 108 (90 Base) MCG/ACT AEPB Inhale 1-2 puffs into the lungs every 4 (four) hours as needed. 1 each 2  . fluticasone-salmeterol (ADVAIR HFA) 115-21 MCG/ACT inhaler Inhale 2 puffs into the lungs 2 (two) times daily. 3 Inhaler 3  . lisinopril (PRINIVIL,ZESTRIL) 5 MG tablet Take 1 tablet (5 mg total) by mouth daily. 90 tablet 1  . metFORMIN (GLUCOPHAGE-XR) 500 MG 24 hr tablet Take 1 tablet (500 mg total) by mouth daily with breakfast. 90 tablet 1  .  methadone (DOLOPHINE) 10 MG/ML solution Take 85 mg by mouth daily.     . pravastatin (PRAVACHOL) 40 MG tablet Take 1 tablet (40 mg total) by mouth daily. 90 tablet 1  . Spacer/Aero-Holding Chambers (E-Z SPACER) inhaler Use as instructed 1 each 2  . tamsulosin (FLOMAX) 0.4 MG CAPS capsule Take 1 capsule (0.4 mg total) by mouth daily. 30 capsule 3  . traZODone (DESYREL) 50 MG tablet Take 1 tablet (50 mg total) by mouth at bedtime as needed for sleep. 90 tablet 1  . Testosterone Enanthate (XYOSTED) 75 MG/0.5ML SOAJ Inject 75 mg into the skin once a week. 4 pen 2  . valACYclovir (  VALTREX) 500 MG tablet Take 1 tablet (500 mg total) by mouth daily. 90 tablet 1   No current facility-administered medications for this visit.    REVIEW OF SYSTEMS:   Constitutional: ( - ) fevers, ( - )  chills , ( - ) night sweats Eyes: ( - ) blurriness of vision, ( - ) double vision, ( - ) watery eyes Ears, nose, mouth, throat, and face: ( - ) mucositis, ( - ) sore throat Respiratory: ( - ) cough, ( - ) dyspnea, ( - ) wheezes Cardiovascular: ( - ) palpitation, ( - ) chest discomfort, ( - ) lower extremity swelling Gastrointestinal:  ( - ) nausea, ( - ) heartburn, ( - ) change in bowel habits Skin: ( - ) abnormal skin rashes Lymphatics: ( - ) new lymphadenopathy, ( - ) easy bruising Neurological: ( - ) numbness, ( - ) tingling, ( - ) new weaknesses Behavioral/Psych: ( - ) mood change, ( - ) new changes  All other systems were reviewed with the patient and are negative.  PHYSICAL EXAMINATION: ECOG PERFORMANCE STATUS: 0 - Asymptomatic  Vitals:   07/25/19 0856  BP: 134/86  Pulse: 65  Resp: 20  Temp: 97.8 F (36.6 C)  SpO2: 98%   Filed Weights   07/25/19 0856  Weight: 252 lb 8 oz (114.5 kg)    GENERAL: well appearing middle aged male in NAD  SKIN: skin color, texture, turgor are normal, no rashes or significant lesions EYES: conjunctiva are pink and non-injected, sclera clear NECK: supple, non-tender   LYMPH:  no palpable lymphadenopathy in the cervical, axillary or supraclavicular LUNGS: clear to auscultation and percussion with normal breathing effort HEART: regular rate & rhythm and no murmurs and no lower extremity edema ABDOMEN: soft, non-tender, non-distended, unable to appreciated HSM due to body habitus.  Musculoskeletal: no cyanosis of digits and no clubbing  PSYCH: alert & oriented x 3, fluent speech NEURO: no focal motor/sensory deficits  LABORATORY DATA:  I have reviewed the data as listed Lab Results  Component Value Date   WBC 13.4 (H) 07/25/2019   HGB 14.9 07/25/2019   HCT 45.1 07/25/2019   MCV 84.5 07/25/2019   PLT 348 07/25/2019   NEUTROABS 7.1 07/25/2019    PATHOLOGY: None to review.   BLOOD FILM: Review of the peripheral blood smear showed normal appearing white cells with neutrophils that were appropriately lobated and granulated. There was no predominance of bi-lobed or hyper-segmented neutrophils appreciated. No Dohle bodies were noted. There was no left shifting, immature forms or blasts noted. Lymphocytes predominantly normal in size without any large granular lymphocytes. Increase in reactive/atypical lymphs and smudge cells. Red cells show no anisopoikilocytosis, macrocytes , microcytes or polychromasia. There were no schistocytes, target cells, echinocytes, acanthocytes, dacrocytes, or stomatocytes.There was no rouleaux formation, nucleated red cells, or intra-cellular inclusions noted. The platelets are normal in size, shape, and color without any clumping evident.  RADIOGRAPHIC STUDIES: I have personally reviewed the radiological images as listed and agreed with the findings in the report. No results found.  ASSESSMENT & PLAN Terry [name] 48 y.o. male with medical history significant for HTN, HLD and opioid abuse in methadone clinic who presents for evaluation of leukocytosis.  After discussion with the patient in the view the labs the findings  are most consistent with a leukocytosis with neutrophilic predominance, however the patient does have an absolute lymphocytosis.  The patient does not smoke and currently is not taking any medications associated with  lymphocytosis.  As such I am concerned with the possible remaining etiologies, which could include CLL or monoclonal B cell lymphocytosis.  On review the peripheral blood film he does have some reactive lymphocytes as well as occasional smudge cells.  We are currently ordering a flow cytometry to determine if there is a monoclonal population of these lymphocytes.  Fortunately on exam he does not have any lymphadenopathy nor does he report any B symptoms.  At this time there is no clear indication for bone marrow biopsy or imaging, however we will need to keep this in mind in the future if his lymphocytosis persists.  09/25/2017: HIV negative.  #Leukocytosis with Neutrophilic Predominance #Lymphocytosis --today will order a CBC, CMP, ESR, CRP, TSH --additionally will collect Hepatitis B, C serologies. HIV negative in Feb 2019 --will order flow cytometry to determine if lymphocytes are monoclonal  --possible increase due to transient viral infection, though I would not expect the elevation to last for 9 months as it has  --no lymphadenopathy on exam or B symptoms, no clear indication for imaging at this time --await the results of the above labs. If persistent/worsening can consider a bone marrow biopsy.  --RTC in 3 months to continue to monitor    Orders Placed This Encounter  Procedures  . CBC with Differential (Cancer Center Only)    Standing Status:   Future    Number of Occurrences:   1    Standing Expiration Date:   07/24/2020  . CMP ([facility] only)    Standing Status:   Future    Number of Occurrences:   1    Standing Expiration Date:   07/24/2020  . Sedimentation rate    Standing Status:   Future    Number of Occurrences:   1    Standing Expiration Date:    07/24/2020  . Save Smear ([facility])    Standing Status:   Future    Number of Occurrences:   1    Standing Expiration Date:   07/24/2020  . Lactate dehydrogenase (LDH)    Standing Status:   Future    Number of Occurrences:   1    Standing Expiration Date:   07/24/2020  . TSH    Standing Status:   Future    Number of Occurrences:   1    Standing Expiration Date:   07/24/2020  . Flow Cytometry    Standing Status:   Future    Number of Occurrences:   1    Standing Expiration Date:   07/24/2020  . C-reactive protein    Standing Status:   Future    Number of Occurrences:   1    Standing Expiration Date:   07/24/2020  . Hepatitis C antibody (reflex if positive)    Standing Status:   Future    Number of Occurrences:   1    Standing Expiration Date:   07/24/2020  . Hepatitis B surface antibody    Standing Status:   Future    Number of Occurrences:   1    Standing Expiration Date:   07/24/2020  . Hepatitis B surface antigen    Standing Status:   Future    Number of Occurrences:   1    Standing Expiration Date:   07/24/2020  . Hepatitis B core antibody, total    Standing Status:   Future    Number of Occurrences:   1    Standing Expiration Date:   07/24/2020  All questions were answered. The patient knows to call the clinic with any problems, questions or concerns.  A total of more than 60 minutes were spent on this encounter and over half of that time was spent on counseling and coordination of care as outlined above.   Ledell Peoples, MD Department of Hematology/Oncology [place] at [facility] Phone: [phone] Pager: [phone] Email: [email or web address]  07/26/2019 3:19 PM

## 2019-07-26 ENCOUNTER — Telehealth: Payer: Self-pay | Admitting: Hematology and Oncology

## 2019-07-26 LAB — HCV COMMENT:

## 2019-07-26 LAB — HEPATITIS C ANTIBODY (REFLEX): HCV Ab: <0.1 s/co ratio (ref 0.0–0.9)

## 2019-07-26 NOTE — Telephone Encounter (Signed)
No los per 12/23.

## 2019-07-29 LAB — SURGICAL PATHOLOGY

## 2019-07-30 LAB — FLOW CYTOMETRY

## 2019-08-08 ENCOUNTER — Telehealth: Payer: Self-pay | Admitting: Hematology and Oncology

## 2019-08-08 DIAGNOSIS — D72820 Lymphocytosis (symptomatic): Secondary | ICD-10-CM

## 2019-08-08 NOTE — Telephone Encounter (Signed)
Called Terry [name] to discuss the result of his blood work from late December. Findings were concerning for a monoclonal B cell population, consistent with possible CLL vs MCL or other lymphoma. We will order a CT scan to look for lymphadenopathy and consider lymph node biopsy/excision if a large lymph node can be found. Furthermore if no enlarged lymph nodes are found we can consider a CLL prognostic panel to look for definitive mutations.  I explained the next steps with Terry [name] and he voiced his understanding. We will proceed with a CT scan to further evaluate.  Ledell Peoples, MD Department of Hematology/Oncology [place] at [facility] Phone: [phone] Pager: [phone] Email: [email or web address]

## 2019-08-15 ENCOUNTER — Ambulatory Visit (HOSPITAL_COMMUNITY)
Admission: RE | Admit: 2019-08-15 | Discharge: 2019-08-15 | Disposition: A | Discharge status: Home / Self Care | Payer: BC Managed Care – PPO | Source: Ambulatory Visit | Attending: Hematology and Oncology | Admitting: Hematology and Oncology

## 2019-08-15 ENCOUNTER — Other Ambulatory Visit: Payer: Self-pay

## 2019-08-15 ENCOUNTER — Telehealth: Payer: Self-pay | Admitting: Hematology and Oncology

## 2019-08-15 DIAGNOSIS — D72820 Lymphocytosis (symptomatic): Secondary | ICD-10-CM | POA: Insufficient documentation

## 2019-08-15 DIAGNOSIS — C801 Malignant (primary) neoplasm, unspecified: Secondary | ICD-10-CM | POA: Diagnosis not present

## 2019-08-15 MED ORDER — IOHEXOL 300 MG/ML  SOLN
100.0000 mL | Freq: Once | INTRAMUSCULAR | Status: AC | PRN
  Administered 2019-08-15: 100 mL via INTRAVENOUS

## 2019-08-15 MED ORDER — SODIUM CHLORIDE (PF) 0.9 % IJ SOLN
INTRAMUSCULAR | Status: AC
  Filled 2019-08-15: qty 50

## 2019-08-15 NOTE — Telephone Encounter (Signed)
Called Terry [name] to discuss the results of his CT scan.  No evidence of any lymphadenopathy or splenomegaly noted on the scan.  No clear abnormalities noted on the CT of the chest abdomen and the pelvis.  Given this finding his lab results are most consistent with a CLL versus a mantle cell lymphoma.  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  In order to confirm the diagnosis we will order a CLL panel as well as an assessment for the translocation 11:14.  In the event that CLL were diagnosed we would recommend close observation and monitoring. Terry [name] and his wife voiced their understanding and will be scheduled for bloodwork next week.   Ledell Peoples, MD Department of Hematology/Oncology [place] at [facility] Phone: [phone] Pager: [phone] Email: [email or web address]

## 2019-08-16 ENCOUNTER — Telehealth: Payer: Self-pay | Admitting: Hematology and Oncology

## 2019-08-16 NOTE — Telephone Encounter (Signed)
I talk with patient regarding schedule  

## 2019-08-20 ENCOUNTER — Inpatient Hospital Stay: Payer: BC Managed Care – PPO | Attending: Hematology and Oncology

## 2019-08-20 ENCOUNTER — Other Ambulatory Visit: Payer: Self-pay

## 2019-08-20 ENCOUNTER — Encounter: Payer: Self-pay | Admitting: Hematology and Oncology

## 2019-08-20 DIAGNOSIS — D72820 Lymphocytosis (symptomatic): Secondary | ICD-10-CM | POA: Diagnosis not present

## 2019-08-24 ENCOUNTER — Telehealth: Payer: Self-pay

## 2019-08-27 NOTE — Telephone Encounter (Signed)
Have you seen any prior auth paperwork or denials? I Haven't seen anything come across. Looks like testosterone injections, androgel are both tier 2 (lowest tier testosterone replacement on formulary). Until I see paperwork I don't have answer as to why they aren't covering?

## 2019-08-28 NOTE — Telephone Encounter (Signed)
I spoke with Shirlee Limerick at [facility] and she stated a prior Josem Kaufmann is needed.  Rx info was given and I submitted the prior auth via [email or web address]--key [id number].  I called the pt and informed him of this and he will be contacted when a determination is received.

## 2019-08-30 ENCOUNTER — Telehealth: Payer: Self-pay | Admitting: Hematology and Oncology

## 2019-08-30 NOTE — Telephone Encounter (Signed)
Scheduled appt per 1/27 sch message - pt aware of appt date and time   

## 2019-08-30 NOTE — Telephone Encounter (Signed)
Fax received from [facility] stating the request was denied and placed on Dr [employer].

## 2019-09-03 NOTE — Telephone Encounter (Signed)
Med denied because they need 2 lab tests showing low testosterone. I ordered future labs at last televisit.   Please set up lab visit for him at his convenience in morning to get testosterone. No need to be fasting. Advise him to tell lab tech that he is getting JUST testosterone. I had ordered A1C as well, but it is too early to repeat this. He doesn't have to get the testosterone rechecked early if he doesn't want to, but if he completes it will help move along approval for his testosterone supplementation.

## 2019-09-04 NOTE — Progress Notes (Signed)
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 MCV 84.2. ANC 8600, ALC 1500 2) 10/13/2018: WBC 11.9, Hgb 13.8, Plt 342. MCV 84.7. [place] 5200, ALC 5000 3) 07/11/2019: WBC 11.8, Hgb 14.2, Plt 357, MCV 86.1. [place] 5300, ALC 5200 4) 07/25/2019: establish care with Dr. Lorenso Courier. Flow cytometry showed monoclonal B-cell population without expression of CD5 or CD10 comprises 59% of all lymphocytes. 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  5) 08/20/2019: CLL panel returned positive for 17p13 deletion and no evidence of t(11:14). Findings consistent with diagnosis of CLL  Interval History:  Terry [name] 49 y.o. male with medical history significant for CLL Rai Stage 0 presents for a follow up visit. The patient's last visit was on 07/25/2019 at which time he established care in our clinic. In the interim since the last visit he has had further testing which revealed a p53 (17p13) deletion.   On exam today Terry [name] notes that he feels well.  He reports having no fevers, chills, sweats, nausea, vomiting, or diarrhea.  He reports that he has not noticed any lymphadenopathy in his neck, supraclavicular region, axillary, or groin region.  He notes that his weight has been stable and the only major change in his health is that he is currently being evaluated for testosterone shots.  Today he is accompanied by his wife as they are both interested in learning about the medical condition that he has been diagnosed with.  Details regarding their questions and concerns are listed below in the assessment.  A full 10 point ROS was otherwise negative.  MEDICAL HISTORY:  Past Medical History:  Diagnosis Date   Asthma    Hyperlipidemia     Hypertension    Opioid abuse ([place])    following with methadone clinic   Pneumonia     SURGICAL HISTORY: Past Surgical History:  Procedure Laterality Date   right bicep Right 2013   TRICEPS TENDON REPAIR Left 04/2013    SOCIAL HISTORY: Social History   Socioeconomic History   Marital status: Married    Spouse name: Not on file   Number of children: Not on file   Years of education: Not on file   Highest education level: Not on file  Occupational History   Not on file  Tobacco Use   Smoking status: Never Smoker   Smokeless tobacco: Never Used  Substance and Sexual Activity   Alcohol use: Yes   Drug use: Yes    Types: Marijuana   Sexual activity: Yes  Other Topics Concern   Not on file  Social History Narrative   Not on file   Social Determinants of Health   Financial Resource Strain:    Difficulty of Paying Living Expenses: Not on file  Food Insecurity:    Worried About [place] in the Last Year: Not on file   Ran Out of Food in the Last Year: Not on file  Transportation Needs:    Lack of Transportation (Medical): Not on file   Lack of Transportation (Non-Medical): Not on file  Physical Activity:    Days of Exercise per Week: Not on file   Minutes of Exercise per Session: Not on file  Stress:    Feeling of Stress : Not on file  Social Connections:    Frequency of Communication with Friends and Family: Not on file   Frequency of Social Gatherings with Friends and Family: Not on file   Attends Religious Services: Not on Electrical engineer or Organizations: Not on file   Attends Archivist Meetings: Not on file   Marital Status: Not on file  Intimate Partner Violence:    Fear of Current or Ex-Partner: Not on file   Emotionally Abused: Not on file   Physically Abused: Not on file   Sexually Abused: Not on file    FAMILY HISTORY: Family History  Problem Relation Age of Onset   Diabetes  Mother    AAA (abdominal aortic aneurysm) Mother 29   Blindness Mother    Diabetes Father    Stroke Father 50   Heart attack Father    Drug abuse Sister    Drug abuse Brother    Brain cancer Maternal Grandmother 47   Diabetes Paternal Grandmother    Stroke Paternal Grandmother    Diabetes Paternal Grandfather 55    ALLERGIES:  has No Known Allergies.  MEDICATIONS:  Current Outpatient Medications  Medication Sig Dispense Refill   Albuterol Sulfate 108 (90 Base) MCG/ACT AEPB Inhale 1-2 puffs into the lungs every 4 (four) hours as needed. 1 each 2   fluticasone-salmeterol (ADVAIR HFA) 115-21 MCG/ACT inhaler Inhale 2 puffs into the lungs 2 (two) times daily. 3 Inhaler 3   lisinopril (PRINIVIL,ZESTRIL) 5 MG tablet Take 1 tablet (5 mg total) by mouth daily. 90 tablet 1   metFORMIN (GLUCOPHAGE-XR) 500 MG 24 hr tablet Take 1 tablet (500 mg total) by mouth daily with breakfast. 90 tablet 1   methadone (DOLOPHINE) 10 MG/ML solution Take 85 mg by mouth daily.      pravastatin (PRAVACHOL) 40 MG tablet Take 1 tablet (40 mg total) by mouth daily. 90 tablet 1   Spacer/Aero-Holding Chambers (E-Z SPACER) inhaler Use as instructed 1 each 2   tamsulosin (FLOMAX) 0.4 MG CAPS capsule Take 1 capsule (0.4 mg total) by mouth daily. 30 capsule 3   Testosterone Enanthate (XYOSTED) 75 MG/0.5ML SOAJ Inject 75 mg into the skin once a week. 4 pen 2   traZODone (DESYREL) 50 MG tablet Take 1 tablet (50 mg total) by mouth at bedtime as needed for sleep. 90 tablet 1   valACYclovir (VALTREX) 500 MG tablet Take 1 tablet (500 mg total) by mouth daily. 90 tablet 1   No current facility-administered medications for this visit.    REVIEW OF SYSTEMS:   Constitutional: ( - ) fevers, ( - )  chills , ( - ) night sweats Eyes: ( - ) blurriness of vision, ( - ) double vision, ( - ) watery eyes Ears, nose, mouth, throat, and face: ( - ) mucositis, ( - ) sore throat Respiratory: ( - ) cough, ( - )  dyspnea, ( - ) wheezes Cardiovascular: ( - ) palpitation, ( - ) chest discomfort, ( - ) lower extremity swelling Gastrointestinal:  ( - ) nausea, ( - ) heartburn, ( - ) change in bowel habits Skin: ( - ) abnormal skin rashes Lymphatics: ( - ) new lymphadenopathy, ( - ) easy bruising Neurological: ( - ) numbness, ( - ) tingling, ( - ) new weaknesses Behavioral/Psych: ( - ) mood change, ( - ) new changes  All other systems were reviewed with the patient and are negative.  PHYSICAL EXAMINATION: ECOG PERFORMANCE STATUS: 0 - Asymptomatic  Vitals:   09/05/19 1118  BP: (!) 128/93  Pulse: 80  Resp: 18  Temp: 98.2 F (36.8 C)  SpO2: 98%   Filed Weights   09/05/19 1118  Weight: 261 lb (118.4 kg)    GENERAL: well appearing middle aged male in NAD  SKIN: skin color, texture, turgor are normal, no rashes or significant lesions EYES: conjunctiva are pink and non-injected, sclera clear NECK: supple, non-tender  LYMPH:  no palpable lymphadenopathy in the cervical, axillary or supraclavicular LUNGS: clear to auscultation and percussion with normal breathing effort HEART: regular rate & rhythm and no murmurs and no lower extremity edema ABDOMEN: limited due to body habitus.  Musculoskeletal: no cyanosis of digits and no clubbing  PSYCH: alert & oriented x 3, fluent speech NEURO: no focal motor/sensory deficits  LABORATORY DATA:  I have reviewed the data as listed CBC Latest Ref Rng & Units 07/25/2019 07/11/2019 10/13/2018  WBC 4.0 - 10.5 K/uL 13.4(H) 11.8(H) 11.9(H)  Hemoglobin 13.0 - 17.0 g/dL 14.9 14.2 13.8  Hematocrit 39.0 - 52.0 % 45.1 43.4 41.2  Platelets 150 - 400 K/uL 348 357.0 342.0    CMP Latest Ref Rng & Units 07/25/2019 07/11/2019 10/13/2018  Glucose 70 - 99 mg/dL 117(H) 105(H) 98  BUN 6 - 20 mg/dL _0 Creatinine 0.61 - 1.24 mg/dL 0.98 0.99 1.15  Sodium 135 - 145 mmol/L 138 137 139  Potassium 3.5 - 5.1 mmol/L 4.5 4.7 4.3  Chloride 98 - 111 mmol/L 102 100 98  CO2 22 -  32 mmol/L 27 31 34(H)  Calcium 8.9 - 10.3 mg/dL 9.0 9.2 9.8  Total Protein 6.5 - 8.1 g/dL 6.9 6.3 7.1  Total Bilirubin 0.3 - 1.2 mg/dL 0.4 0.5 0.4  Alkaline Phos 38 - 126 U/L 61 59 76  AST 15 - 41 U/L _1 ALT 0 - 44 U/L 22 22 39    BLOOD FILM: None today.   RADIOGRAPHIC STUDIES: I have personally reviewed the radiological images as listed and agreed with the findings in the report: no evidence of lymphadenopathy or splenomegaly.   CT Chest W Contrast  Result Date: 08/15/2019 CLINICAL DATA:  Cancer of unknown primary, monoclonal B-cell population concerning for lymphoma, evaluate for lymphadenopathy EXAM: CT CHEST, ABDOMEN, AND PELVIS WITH CONTRAST TECHNIQUE: Multidetector CT imaging of the chest, abdomen and pelvis was performed following the standard protocol during bolus administration of intravenous contrast. CONTRAST:  137m OMNIPAQUE IOHEXOL 300 MG/ML SOLN, additional oral enteric contrast COMPARISON:  None. FINDINGS: CT CHEST FINDINGS Cardiovascular: No significant vascular findings. Normal heart size. No pericardial effusion. Mediastinum/Nodes: No enlarged mediastinal, hilar, or axillary lymph nodes. Thyroid gland, trachea, and esophagus demonstrate no significant findings. Lungs/Pleura: Lungs are clear. 5 mm fissural nodule of the superior segment left lower lobe (series 4, image 65). No pleural effusion or pneumothorax. Musculoskeletal: No chest wall mass or suspicious bone lesions identified. CT ABDOMEN PELVIS FINDINGS Hepatobiliary: No solid liver abnormality is seen. No gallstones, gallbladder wall thickening, or biliary dilatation. Pancreas: Unremarkable. No pancreatic ductal dilatation or surrounding inflammatory changes. Spleen: Normal in size without significant abnormality. Adrenals/Urinary Tract: Adrenal glands are unremarkable. Kidneys are normal, without renal calculi, solid lesion, or hydronephrosis. Bladder is unremarkable. Stomach/Bowel: Stomach is within normal limits.  Appendix appears normal. No evidence of bowel wall thickening, distention, or inflammatory changes. Vascular/Lymphatic: Minimal aortic atherosclerosis. No enlarged abdominal or pelvic lymph nodes. Reproductive: No mass or other abnormality. Other: No abdominal wall hernia or abnormality. No abdominopelvic ascites.  Musculoskeletal: No acute or significant osseous findings. IMPRESSION: 1. No evidence of lymphadenopathy in the chest, abdomen, or pelvis. 2. There is a 5 mm fissural nodule of the superior segment of the left lower lobe, which is benign by morphology in the absence of established primary malignancy. Attention on follow-up if malignancy is diagnosed. Otherwise no routine CT follow-up is necessary. 3. Aortic Atherosclerosis (ICD10-I70.0). Electronically Signed   By: Eddie Candle M.D.   On: 08/15/2019 10:14   CT Abdomen Pelvis W Contrast  Result Date: 08/15/2019 CLINICAL DATA:  Cancer of unknown primary, monoclonal B-cell population concerning for lymphoma, evaluate for lymphadenopathy EXAM: CT CHEST, ABDOMEN, AND PELVIS WITH CONTRAST TECHNIQUE: Multidetector CT imaging of the chest, abdomen and pelvis was performed following the standard protocol during bolus administration of intravenous contrast. CONTRAST:  172m OMNIPAQUE IOHEXOL 300 MG/ML SOLN, additional oral enteric contrast COMPARISON:  None. FINDINGS: CT CHEST FINDINGS Cardiovascular: No significant vascular findings. Normal heart size. No pericardial effusion. Mediastinum/Nodes: No enlarged mediastinal, hilar, or axillary lymph nodes. Thyroid gland, trachea, and esophagus demonstrate no significant findings. Lungs/Pleura: Lungs are clear. 5 mm fissural nodule of the superior segment left lower lobe (series 4, image 65). No pleural effusion or pneumothorax. Musculoskeletal: No chest wall mass or suspicious bone lesions identified. CT ABDOMEN PELVIS FINDINGS Hepatobiliary: No solid liver abnormality is seen. No gallstones, gallbladder wall  thickening, or biliary dilatation. Pancreas: Unremarkable. No pancreatic ductal dilatation or surrounding inflammatory changes. Spleen: Normal in size without significant abnormality. Adrenals/Urinary Tract: Adrenal glands are unremarkable. Kidneys are normal, without renal calculi, solid lesion, or hydronephrosis. Bladder is unremarkable. Stomach/Bowel: Stomach is within normal limits. Appendix appears normal. No evidence of bowel wall thickening, distention, or inflammatory changes. Vascular/Lymphatic: Minimal aortic atherosclerosis. No enlarged abdominal or pelvic lymph nodes. Reproductive: No mass or other abnormality. Other: No abdominal wall hernia or abnormality. No abdominopelvic ascites. Musculoskeletal: No acute or significant osseous findings. IMPRESSION: 1. No evidence of lymphadenopathy in the chest, abdomen, or pelvis. 2. There is a 5 mm fissural nodule of the superior segment of the left lower lobe, which is benign by morphology in the absence of established primary malignancy. Attention on follow-up if malignancy is diagnosed. Otherwise no routine CT follow-up is necessary. 3. Aortic Atherosclerosis (ICD10-I70.0). Electronically Signed   By: AEddie CandleM.D.   On: 08/15/2019 10:14    ASSESSMENT & PLAN QEfraim Vanallen89y.o. male with medical history significant for CLL Rai Stage 0 presents for a follow up visit. The patient's last visit was on 07/25/2019 at which time he established care in our clinic.  After review of the imaging and the lab work the patient's findings are most consistent with CLL.  Given that he only has a lymphocytosis this would be considered a CLL Rai stage 0.  There is no clear indication for treatment at this time and I would recommend that he enter into observation with close clinical monitoring and strict return precautions.  Today Mr. DKwanwas accompanied by his wife and we discussed the nature of CLL, the relatively good prognosis, the meaning of his a 17 P 13  deletion, and possible treatments moving forward.  We also discussed that this tends to be a more indolent cancer and that close monitoring which is appropriate at this time.  We also discussed the criteria for which we would want to start treatment including bulky lymphadenopathy, anemia, or thrombocytopenia.  Mr. DSaboland his wife expressed their understanding of the disease and the plan  moving forward.  In the event treatment is required could consider therapy with ibrutinib +/- rituximab, Acalabrutinib +/- obinutuzumab, or Venetoclax +obinutuzumab. Of these treatment options only Venetoclax/obinutuzumab would be of limited duration (12 months). We can discuss these treatments in detail when the time comes.   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 Will continue to monitor in clinic with serial visits and blood work to assess for progression of the disease.  --plan for visits q 3-6 months with CBC, CMP, LDH, and lymph node exam --Plan to have patient return in 3 months with PRN visits if new symptoms were to develop.   No orders of the defined types were placed in this encounter.  All questions were answered. The patient knows to call the clinic with any problems, questions or concerns.  A total of more than 30 minutes were spent on this encounter and over half of that time was spent on counseling and coordination of care as outlined above.   Ledell Peoples, MD Department of Hematology/Oncology [place] at [facility] Phone: [phone] Pager: [phone] Email: [email or web address]  09/05/2019 12:51 PM

## 2019-09-04 NOTE — Telephone Encounter (Signed)
I called the pt and informed him of the message below.  Patient stated he would like to have the test repeated and a lab appt was scheduled for 2/11 to arrive at 7:10am for check in.

## 2019-09-05 ENCOUNTER — Inpatient Hospital Stay: Payer: BC Managed Care – PPO | Attending: Hematology and Oncology | Admitting: Hematology and Oncology

## 2019-09-05 ENCOUNTER — Other Ambulatory Visit: Payer: Self-pay

## 2019-09-05 ENCOUNTER — Other Ambulatory Visit: Payer: Self-pay | Admitting: *Deleted

## 2019-09-05 ENCOUNTER — Encounter: Payer: Self-pay | Admitting: Hematology and Oncology

## 2019-09-05 VITALS — BP 128/93 | HR 80 | Temp 98.2°F | Resp 18 | Ht 69.0 in | Wt 261.0 lb

## 2019-09-05 DIAGNOSIS — Z833 Family history of diabetes mellitus: Secondary | ICD-10-CM | POA: Diagnosis not present

## 2019-09-05 DIAGNOSIS — Z808 Family history of malignant neoplasm of other organs or systems: Secondary | ICD-10-CM | POA: Diagnosis not present

## 2019-09-05 DIAGNOSIS — Z7951 Long term (current) use of inhaled steroids: Secondary | ICD-10-CM | POA: Diagnosis not present

## 2019-09-05 DIAGNOSIS — Z8249 Family history of ischemic heart disease and other diseases of the circulatory system: Secondary | ICD-10-CM | POA: Insufficient documentation

## 2019-09-05 DIAGNOSIS — J45909 Unspecified asthma, uncomplicated: Secondary | ICD-10-CM | POA: Insufficient documentation

## 2019-09-05 DIAGNOSIS — Z7984 Long term (current) use of oral hypoglycemic drugs: Secondary | ICD-10-CM | POA: Diagnosis not present

## 2019-09-05 DIAGNOSIS — C9110 Chronic lymphocytic leukemia of B-cell type not having achieved remission: Secondary | ICD-10-CM | POA: Diagnosis not present

## 2019-09-05 DIAGNOSIS — E119 Type 2 diabetes mellitus without complications: Secondary | ICD-10-CM

## 2019-09-05 DIAGNOSIS — E785 Hyperlipidemia, unspecified: Secondary | ICD-10-CM | POA: Diagnosis not present

## 2019-09-05 DIAGNOSIS — I10 Essential (primary) hypertension: Secondary | ICD-10-CM | POA: Diagnosis not present

## 2019-09-05 DIAGNOSIS — Z79899 Other long term (current) drug therapy: Secondary | ICD-10-CM | POA: Insufficient documentation

## 2019-09-05 DIAGNOSIS — F1110 Opioid abuse, uncomplicated: Secondary | ICD-10-CM | POA: Insufficient documentation

## 2019-09-05 LAB — FISH,CLL PROGNOSTIC PANEL

## 2019-09-05 LAB — LYMPHOMA-MANTLE CELL

## 2019-09-05 MED ORDER — LISINOPRIL 5 MG PO TABS: 5.0000 mg | ORAL_TABLET | Freq: Every day | ORAL | 1 refills | Status: DC

## 2019-09-05 NOTE — Telephone Encounter (Signed)
Rx done. 

## 2019-09-06 ENCOUNTER — Telehealth: Payer: Self-pay | Admitting: Hematology and Oncology

## 2019-09-06 NOTE — Telephone Encounter (Signed)
Scheduled per los. Called and left msg. Mailed printout  °

## 2019-09-12 ENCOUNTER — Other Ambulatory Visit: Payer: Self-pay

## 2019-09-13 ENCOUNTER — Other Ambulatory Visit (INDEPENDENT_AMBULATORY_CARE_PROVIDER_SITE_OTHER): Payer: BC Managed Care – PPO

## 2019-09-13 DIAGNOSIS — R7989 Other specified abnormal findings of blood chemistry: Secondary | ICD-10-CM

## 2019-09-13 DIAGNOSIS — E119 Type 2 diabetes mellitus without complications: Secondary | ICD-10-CM

## 2019-09-13 LAB — HEMOGLOBIN A1C: Hgb A1c MFr Bld: 6.6 % — ABNORMAL HIGH (ref 4.6–6.5)

## 2019-09-13 LAB — TESTOSTERONE: Testosterone: 138.25 ng/dL — ABNORMAL LOW (ref 300.00–890.00)

## 2019-09-18 ENCOUNTER — Telehealth: Payer: Self-pay | Admitting: *Deleted

## 2019-09-18 NOTE — Telephone Encounter (Signed)
Patient stated he was not given the flu shot at his last visit and questioned if he needed a pneumonia shot also due to CLL?  Message sent to PCP.

## 2019-09-18 NOTE — Telephone Encounter (Signed)
Second prior auth for Leslie 75mg  sent to [email or web address] [id number] - [place] Case ID: [id number] Approved today:CaseId:[id number];Status:Approved;Review Type:Prior Auth;Coverage Start Date:08/19/2019;Coverage End Date:09/17/2020.   I called [facility], informed Terry [name] of the approval and the patient was informed also.

## 2019-09-19 NOTE — Telephone Encounter (Signed)
He can do a nurse visit for flu and prevnar 13 at his convenience.

## 2019-09-19 NOTE — Telephone Encounter (Signed)
Spoke with the pt and scheduled a nurse visit appt for 2/19.

## 2019-09-21 ENCOUNTER — Ambulatory Visit (INDEPENDENT_AMBULATORY_CARE_PROVIDER_SITE_OTHER): Payer: BC Managed Care – PPO | Admitting: *Deleted

## 2019-09-21 ENCOUNTER — Other Ambulatory Visit: Payer: Self-pay

## 2019-09-21 DIAGNOSIS — Z23 Encounter for immunization: Secondary | ICD-10-CM

## 2019-09-21 NOTE — Progress Notes (Signed)
Per orders of Dr. Ethlyn Gallery, injection of Prevnar and Fluarix given by Westley Hummer. Patient tolerated injection well.

## 2019-09-24 ENCOUNTER — Telehealth: Payer: Self-pay | Admitting: Family Medicine

## 2019-09-24 DIAGNOSIS — E1165 Type 2 diabetes mellitus with hyperglycemia: Secondary | ICD-10-CM

## 2019-09-24 DIAGNOSIS — E1169 Type 2 diabetes mellitus with other specified complication: Secondary | ICD-10-CM

## 2019-09-24 DIAGNOSIS — E119 Type 2 diabetes mellitus without complications: Secondary | ICD-10-CM

## 2019-09-24 MED ORDER — METFORMIN HCL ER 500 MG PO TB24: 500.0000 mg | ORAL_TABLET | Freq: Every day | ORAL | 1 refills | Status: DC

## 2019-09-24 MED ORDER — LISINOPRIL 5 MG PO TABS: 5.0000 mg | ORAL_TABLET | Freq: Every day | ORAL | 1 refills | Status: DC

## 2019-09-24 MED ORDER — PRAVASTATIN SODIUM 40 MG PO TABS: 40.0000 mg | ORAL_TABLET | Freq: Every day | ORAL | 1 refills | Status: DC

## 2019-09-24 NOTE — Telephone Encounter (Addendum)
Insurance will not cover his medication through his old pharmacy he had to change it to [facility] in order for insurance to cover it.   Pt is out of Lisinopril and Pravastatin for 3/4 days now and is wondering if a small supply can be called in into the [facility] ([place]: [phone]) until [facility] can deliver his medications.   Medication Refill: Lisinopril 5 mg Pravastatin 40 mg Pramipexole 0.125mg  Trazodone 50 mg Metformin 500 mg Tamsulosin 0.4 mg  Pharmacy: [facility]  FAX: [phone]   Pt can be reached at [phone]

## 2019-09-24 NOTE — Telephone Encounter (Signed)
Pt is calling again and stated that he has not taken his lisinopril and pravastatin in 5 days and is very concerned about not taken it in a while.  Pt would like to see if a script can be sent in to [facility] on [address] for about 2-3 days.  Pt state that he has been contacted from [facility] that it is okay for him to get his medication by mail.

## 2019-09-24 NOTE — Telephone Encounter (Signed)
I spoke with Lawana and informed her the Rxs for Lisinopril and Pravastatin were previously sent to the local pharmacy [facility] on [address] and he should have refills on file and she agreed to inform the pt.  Rxs for Lisinopril, Pravastatin and Metformin were sent to [facility].  Request for refills on Tamsulosin (last given by Dr Elease Hashimoto), Pramipexole (did not see on pts current med list) and Trazodone were sent to PCP for approval.

## 2019-09-24 NOTE — Telephone Encounter (Signed)
Pt is calling in again and I spoke with Wendie Simmer and she stated that the pt should have lisinopril and pravastatin at [facility] and the others (Rx's) ones will be sent to the provider for approval.  Pt is aware to call the pharmacy to see it he can get enough medication for 3-4 days.  Pt stated that he will call to see if he is able to do so.

## 2019-09-25 MED ORDER — PRAMIPEXOLE DIHYDROCHLORIDE 0.125 MG PO TABS: 0.1250 mg | ORAL_TABLET | Freq: Three times a day (TID) | ORAL | 0 refills | Status: DC

## 2019-09-25 MED ORDER — TAMSULOSIN HCL 0.4 MG PO CAPS: 0.4000 mg | ORAL_CAPSULE | Freq: Every day | ORAL | 0 refills | Status: DC

## 2019-09-25 MED ORDER — TRAZODONE HCL 50 MG PO TABS: 50.0000 mg | ORAL_TABLET | Freq: Every evening | ORAL | 0 refills | Status: DC | PRN

## 2019-09-25 NOTE — Telephone Encounter (Signed)
I left a detailed message at the pts cell number with the information below and also stating refills were sent to [facility].

## 2019-09-25 NOTE — Telephone Encounter (Signed)
[place] for refills on tamsulosin, pramipexole, and trazodone to preferred pharmacy.   He would be ok to miss a few days of pravastatin if he does not want to pay for this. And the lisinopril he should be able to get a full month for $4 cash pay without insurance so cost shouldn't be issue with this.   Thanks for your help!

## 2019-11-06 ENCOUNTER — Other Ambulatory Visit: Payer: Self-pay | Admitting: Family Medicine

## 2019-11-06 DIAGNOSIS — B001 Herpesviral vesicular dermatitis: Secondary | ICD-10-CM

## 2019-11-29 ENCOUNTER — Other Ambulatory Visit: Payer: Self-pay

## 2019-11-30 ENCOUNTER — Ambulatory Visit (INDEPENDENT_AMBULATORY_CARE_PROVIDER_SITE_OTHER): Payer: BC Managed Care – PPO | Admitting: Family Medicine

## 2019-11-30 ENCOUNTER — Telehealth: Payer: Self-pay | Admitting: Hematology and Oncology

## 2019-11-30 ENCOUNTER — Encounter: Payer: Self-pay | Admitting: Family Medicine

## 2019-11-30 VITALS — BP 136/80 | HR 79 | Temp 97.6°F | Ht 69.0 in | Wt 269.9 lb

## 2019-11-30 DIAGNOSIS — E119 Type 2 diabetes mellitus without complications: Secondary | ICD-10-CM

## 2019-11-30 DIAGNOSIS — R7989 Other specified abnormal findings of blood chemistry: Secondary | ICD-10-CM

## 2019-11-30 DIAGNOSIS — R0683 Snoring: Secondary | ICD-10-CM

## 2019-11-30 DIAGNOSIS — F1129 Opioid dependence with unspecified opioid-induced disorder: Secondary | ICD-10-CM

## 2019-11-30 DIAGNOSIS — E7800 Pure hypercholesterolemia, unspecified: Secondary | ICD-10-CM

## 2019-11-30 DIAGNOSIS — I10 Essential (primary) hypertension: Secondary | ICD-10-CM | POA: Diagnosis not present

## 2019-11-30 DIAGNOSIS — J45901 Unspecified asthma with (acute) exacerbation: Secondary | ICD-10-CM

## 2019-11-30 MED ORDER — LISINOPRIL 5 MG PO TABS: 10.0000 mg | ORAL_TABLET | Freq: Every day | ORAL | 1 refills | Status: DC

## 2019-11-30 NOTE — Patient Instructions (Addendum)
[facility] - office will contact pt to schedule directly  Pulmonologist in [place], [place] Address: [address], [place], [place] [address] Phone: [phone]  Double up on lisinopril (will be 10mg  daily) and check blood pressures daily and report back to me in 2-3 weeks time.

## 2019-11-30 NOTE — Progress Notes (Signed)
Terry [name] DOB: Oct 05, 1970 Encounter date: 11/30/2019  This is a 49 y.o. male who presents with Chief Complaint  Patient presents with  . Hypertension    patient complains of elevated BP x3 days, states he has felt "jittery" recently and stated he has an appt next week with hematology    History of present illness:  Still has had some issues with urinating; thinks that flomax does help him. When having issue he drinks more water and has doubled up and that helps.   Mon-weds bp was 170/110 and he was feeling jittery. Has started low carb diet. No other changes. No additional stress, but always some level of stress. Lowest pressure in last month was 130/80. No headache, no chest pressure, no chest pain, no trouble with breathing. Sometimes headache in the morning, but goes away with eating.   DMII: not checking sugars at home. Taking metformin as directed.   Low testosterone: was a week late getting last testosterone and noted big difference with this. Really felt better within a day or two of getting this.  Wondering if he can go up on his testosterone dose because his levels were so low.  Newly dx CLL following with oncology: This is a surprising diagnosis, but he is happy he is following with specialist and feels comfortable with their care and thorough/regular visits.  Breathing doesn't feel good. Feels like this is related to him being overweight. Would like to get weight off; get back in gym.    No Known Allergies Current Meds  Medication Sig  . Albuterol Sulfate 108 (90 Base) MCG/ACT AEPB Inhale 1-2 puffs into the lungs every 4 (four) hours as needed.  . fluticasone-salmeterol (ADVAIR HFA) 115-21 MCG/ACT inhaler Inhale 2 puffs into the lungs 2 (two) times daily.  Marland Kitchen lisinopril (ZESTRIL) 5 MG tablet Take 1 tablet (5 mg total) by mouth daily.  . metFORMIN (GLUCOPHAGE-XR) 500 MG 24 hr tablet Take 1 tablet (500 mg total) by mouth daily with breakfast.  . methadone  (DOLOPHINE) 10 MG/ML solution Take 85 mg by mouth daily.   . pramipexole (MIRAPEX) 0.125 MG tablet Take 1 tablet (0.125 mg total) by mouth 3 (three) times daily.  . pravastatin (PRAVACHOL) 40 MG tablet Take 1 tablet (40 mg total) by mouth daily.  Marland Kitchen Spacer/Aero-Holding Chambers (E-Z SPACER) inhaler Use as instructed  . tamsulosin (FLOMAX) 0.4 MG CAPS capsule Take 1 capsule (0.4 mg total) by mouth daily.  . Testosterone Enanthate (XYOSTED) 75 MG/0.5ML SOAJ Inject 75 mg into the skin once a week.  . traZODone (DESYREL) 50 MG tablet Take 1 tablet (50 mg total) by mouth at bedtime as needed for sleep.  . valACYclovir (VALTREX) 500 MG tablet TAKE 1 TABLET(500 MG) BY MOUTH DAILY    Review of Systems  Constitutional: Negative for chills, fatigue and fever.  HENT: Negative for congestion and sinus pressure.   Respiratory: Negative for cough, chest tightness, shortness of breath and wheezing.   Cardiovascular: Negative for chest pain, palpitations and leg swelling.       Although blood pressures been elevated, he has not had any side effects with this.  Neurological: Negative for dizziness, light-headedness and headaches.    Objective:  BP 132/90 (BP Location: Left Arm, Patient Position: Sitting, Cuff Size: Large)   Pulse 79   Temp 97.6 F (36.4 C) (Temporal)   Ht 5\' 9"  (1.753 m)   Wt 269 lb 14.4 oz (122.4 kg)   BMI 39.86 kg/m   Weight: 269 lb  14.4 oz (122.4 kg)   BP Readings from Last 3 Encounters:  11/30/19 132/90  09/05/19 (!) 128/93  07/25/19 134/88   Wt Readings from Last 3 Encounters:  11/30/19 269 lb 14.4 oz (122.4 kg)  09/05/19 261 lb (118.4 kg)  07/25/19 252 lb (114.3 kg)    Physical Exam Constitutional:      General: He is not in acute distress.    Appearance: He is well-developed and overweight. He is not diaphoretic.  HENT:     Head: Normocephalic and atraumatic.     Right Ear: External ear normal.     Left Ear: External ear normal.  Eyes:     Conjunctiva/sclera:  Conjunctivae normal.     Pupils: Pupils are equal, round, and reactive to light.     Comments: Pinpoint pupils  Neck:     Thyroid: No thyromegaly.  Cardiovascular:     Rate and Rhythm: Normal rate and regular rhythm.     Heart sounds: Normal heart sounds. No murmur. No friction rub. No gallop.   Pulmonary:     Effort: Pulmonary effort is normal. No respiratory distress.     Breath sounds: Normal breath sounds. No wheezing or rales.  Musculoskeletal:     Cervical back: Neck supple.  Lymphadenopathy:     Cervical: No cervical adenopathy.  Skin:    General: Skin is warm and dry.  Neurological:     Mental Status: He is alert and oriented to person, place, and time.     Cranial Nerves: No cranial nerve deficit.     Motor: No abnormal muscle tone.     Deep Tendon Reflexes: Reflexes normal.     Reflex Scores:      Tricep reflexes are 2+ on the right side and 2+ on the left side.      Bicep reflexes are 2+ on the right side and 2+ on the left side.      Brachioradialis reflexes are 2+ on the right side and 2+ on the left side.      Patellar reflexes are 2+ on the right side and 2+ on the left side. Psychiatric:        Behavior: Behavior normal.     Assessment/Plan  1. Controlled type 2 diabetes mellitus without complication, without long-term current use of insulin ([place]) He is just switched to a more ketogenic diet (not full keto) and is working with wife on healthier eating overall.  I think this will be very helpful for him.  We discussed importance of regular exercise as this will also help with sugar levels.  We will plan to follow A1c closely.  2. Low testosterone We will recheck levels today.  If low we can increase dose of Xyosted. - PSA; Future - Testosterone; Future - Testosterone - PSA  3. Essential hypertension Blood pressure is better in the office than it had been at home, but is still slightly elevated.  We discussed increasing lisinopril to 2 tablets daily and he  will report blood pressures back in 2 to 3 weeks.  He will need a new dose of lisinopril which can be sent in pending his response to this increase. - lisinopril (ZESTRIL) 5 MG tablet; Take 2 tablets (10 mg total) by mouth daily.  Dispense: 90 tablet; Refill: 1   4. Asthma, chronic, unspecified asthma severity, with acute exacerbation Breathing has been stable.  He feels that breathing will be better with some weight loss, which I agree.  He will continue to monitor  his breathing.  5. Pure hypercholesterolemia Continue with pravastatin.  6. Opioid dependence with opioid-induced disorder ([place]) Currently on methadone.  Follows with methadone clinic.  7. Snoring He had forgotten reason for pulmonology referral, but they have just recently called him.  We discussed benefits of correction of sleep apnea if he is indeed having this and discussed that sleep apnea may be related to elevations in blood pressure as well as multiple other health concerns.  He plans to follow-up on this as soon as possible.  Return for pending bloodwork and blood pressure report.      Micheline Rough, MD

## 2019-11-30 NOTE — Telephone Encounter (Signed)
Called pt per 4/30 sch message   - unable to reach pt . Left message for pt to call back to reschedule appt

## 2019-12-01 LAB — TESTOSTERONE: Testosterone: 625 ng/dL (ref 250–827)

## 2019-12-01 LAB — PSA: PSA: 0.2 ng/mL (ref <=4.0)

## 2019-12-02 ENCOUNTER — Other Ambulatory Visit: Payer: Self-pay | Admitting: Family Medicine

## 2019-12-02 DIAGNOSIS — I10 Essential (primary) hypertension: Secondary | ICD-10-CM

## 2019-12-02 DIAGNOSIS — E7800 Pure hypercholesterolemia, unspecified: Secondary | ICD-10-CM

## 2019-12-02 DIAGNOSIS — E119 Type 2 diabetes mellitus without complications: Secondary | ICD-10-CM

## 2019-12-02 MED ORDER — XYOSTED 75 MG/0.5ML ~~LOC~~ SOAJ: 75.0000 mg | SUBCUTANEOUS | 2 refills | Status: DC

## 2019-12-03 ENCOUNTER — Inpatient Hospital Stay: Payer: BC Managed Care – PPO | Admitting: Hematology and Oncology

## 2019-12-03 ENCOUNTER — Inpatient Hospital Stay: Payer: BC Managed Care – PPO

## 2019-12-06 ENCOUNTER — Encounter: Payer: Self-pay | Admitting: Hematology and Oncology

## 2019-12-06 ENCOUNTER — Other Ambulatory Visit: Payer: Self-pay | Admitting: Hematology and Oncology

## 2019-12-06 ENCOUNTER — Other Ambulatory Visit: Payer: Self-pay

## 2019-12-06 ENCOUNTER — Inpatient Hospital Stay (HOSPITAL_BASED_OUTPATIENT_CLINIC_OR_DEPARTMENT_OTHER): Payer: BC Managed Care – PPO | Admitting: Hematology and Oncology

## 2019-12-06 ENCOUNTER — Inpatient Hospital Stay: Payer: BC Managed Care – PPO | Attending: Hematology and Oncology

## 2019-12-06 DIAGNOSIS — Z79899 Other long term (current) drug therapy: Secondary | ICD-10-CM | POA: Insufficient documentation

## 2019-12-06 DIAGNOSIS — C9110 Chronic lymphocytic leukemia of B-cell type not having achieved remission: Secondary | ICD-10-CM | POA: Insufficient documentation

## 2019-12-06 LAB — CMP (CANCER CENTER ONLY)
ALT: 35 U/L (ref 0–44)
AST: 24 U/L (ref 15–41)
Albumin: 4 g/dL (ref 3.5–5.0)
Alkaline Phosphatase: 65 U/L (ref 38–126)
Anion gap: 9 (ref 5–15)
BUN: 16 mg/dL (ref 6–20)
CO2: 26 mmol/L (ref 22–32)
Calcium: 8.8 mg/dL — ABNORMAL LOW (ref 8.9–10.3)
Chloride: 105 mmol/L (ref 98–111)
Creatinine: 1.25 mg/dL — ABNORMAL HIGH (ref 0.61–1.24)
GFR, Est AFR Am: >60 mL/min (ref >60)
GFR, Estimated: >60 mL/min (ref >60)
Glucose, Bld: 89 mg/dL (ref 70–99)
Potassium: 4.4 mmol/L (ref 3.5–5.1)
Sodium: 140 mmol/L (ref 135–145)
Total Bilirubin: 0.7 mg/dL (ref 0.3–1.2)
Total Protein: 6.9 g/dL (ref 6.5–8.1)

## 2019-12-06 LAB — CBC WITH DIFFERENTIAL (CANCER CENTER ONLY)
Abs Immature Granulocytes: 0.03 10*3/uL (ref 0.00–0.07)
Basophils Absolute: 0.1 10*3/uL (ref 0.0–0.1)
Basophils Relative: 1 %
Eosinophils Absolute: 0.5 10*3/uL (ref 0.0–0.5)
Eosinophils Relative: 3 %
HCT: 47.2 % (ref 39.0–52.0)
Hemoglobin: 15.4 g/dL (ref 13.0–17.0)
Immature Granulocytes: 0 %
Lymphocytes Relative: 43 %
Lymphs Abs: 6.1 10*3/uL — ABNORMAL HIGH (ref 0.7–4.0)
MCH: 28.8 pg (ref 26.0–34.0)
MCHC: 32.6 g/dL (ref 30.0–36.0)
MCV: 88.4 fL (ref 80.0–100.0)
Monocytes Absolute: 1 10*3/uL (ref 0.1–1.0)
Monocytes Relative: 7 %
Neutro Abs: 6.5 10*3/uL (ref 1.7–7.7)
Neutrophils Relative %: 46 %
Platelet Count: 314 10*3/uL (ref 150–400)
RBC: 5.34 MIL/uL (ref 4.22–5.81)
RDW: 12.7 % (ref 11.5–15.5)
WBC Count: 14.1 10*3/uL — ABNORMAL HIGH (ref 4.0–10.5)
nRBC: 0 % (ref 0.0–0.2)

## 2019-12-06 LAB — LACTATE DEHYDROGENASE: LDH: 167 U/L (ref 98–192)

## 2019-12-06 NOTE — Progress Notes (Signed)
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 MCV 84.2. ANC 8600, ALC 1500 2) 10/13/2018: WBC 11.9, Hgb 13.8, Plt 342. MCV 84.7. [place] 5200, ALC 5000 3) 07/11/2019: WBC 11.8, Hgb 14.2, Plt 357, MCV 86.1. [place] 5300, ALC 5200 4) 07/25/2019: establish care with Dr. Lorenso Courier. Flow cytometry showed monoclonal B-cell population without expression of CD5 or CD10 comprises 59% of all lymphocytes. 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  5) 08/20/2019: CLL panel returned positive for 17p13 deletion and no evidence of t(11:14). Findings consistent with diagnosis of CLL. Entered in active surveillance.   Interval History:  Terry [name] 49 y.o. male with medical history significant for CLL Rai Stage 0 presents for a follow up visit. The patient's last visit was on 07/25/2019 at which time he established care in our clinic. In the interim since the last visit he has had further testing which revealed a p53 (17p13) deletion.   On exam today Terry [name] notes he has recently started self administering testosterone shots.  He notes that he has had an increase in his energy levels and he feels great.  He has completed 2 shots of the Covid vaccine.  With a newfound energy from the testosterone shots he intends to go back to the gym and begin working out.  His wife currently has him on a low-carb diet in order to help him lose weight, however he has gained approximately 10 pounds since last measured in February 2021.  He reports he has not been having any issues with fevers, chills, sweats, nausea, vomiting or diarrhea.  He notes he has not noticed any lymphadenopathy in the neck, underarms or swelling in the abdomen.  A full 10 point ROS is listed  below.  MEDICAL HISTORY:  Past Medical History:  Diagnosis Date  . Asthma   . Hyperlipidemia   . Hypertension   . Opioid abuse ([place])    following with methadone clinic  . Pneumonia     SURGICAL HISTORY: Past Surgical History:  Procedure Laterality Date  . right bicep Right 2013  . TRICEPS TENDON REPAIR Left 04/2013   ALLERGIES:  has No Known Allergies.  MEDICATIONS:  Current Outpatient Medications  Medication Sig Dispense Refill  . Albuterol Sulfate 108 (90 Base) MCG/ACT AEPB Inhale 1-2 puffs into the lungs every 4 (four) hours as needed. 1 each 2  . fluticasone-salmeterol (ADVAIR HFA) 115-21 MCG/ACT inhaler Inhale 2 puffs into the lungs 2 (two) times daily. 3 Inhaler 3  . lisinopril (ZESTRIL) 5 MG tablet Take 2 tablets (10 mg total) by mouth daily. 90 tablet 1  . metFORMIN (GLUCOPHAGE-XR) 500 MG 24 hr tablet Take 1 tablet (500 mg total) by mouth daily with breakfast. 90 tablet 1  . methadone (DOLOPHINE) 10 MG/ML solution Take 85 mg by mouth daily.     . pramipexole (MIRAPEX) 0.125 MG tablet Take 1 tablet (0.125 mg total) by mouth 3 (three) times daily. 270 tablet 0  . pravastatin (PRAVACHOL) 40 MG tablet Take 1 tablet (40 mg total) by mouth daily. 90 tablet 1  . Spacer/Aero-Holding Chambers (E-Z SPACER) inhaler Use as instructed 1 each 2  . tamsulosin (FLOMAX) 0.4 MG CAPS capsule Take 1 capsule (0.4 mg total) by mouth daily. 90 capsule 0  . Testosterone  Enanthate (XYOSTED) 75 MG/0.5ML SOAJ Inject 75 mg into the skin once a week. 4 pen 2  . traZODone (DESYREL) 50 MG tablet Take 1 tablet (50 mg total) by mouth at bedtime as needed for sleep. 90 tablet 0  . valACYclovir (VALTREX) 500 MG tablet TAKE 1 TABLET(500 MG) BY MOUTH DAILY 90 tablet 0   No current facility-administered medications for this visit.    REVIEW OF SYSTEMS:   Constitutional: ( - ) fevers, ( - )  chills , ( - ) night sweats Eyes: ( - ) blurriness of vision, ( - ) double vision, ( - ) watery eyes Ears, nose,  mouth, throat, and face: ( - ) mucositis, ( - ) sore throat Respiratory: ( - ) cough, ( - ) dyspnea, ( - ) wheezes Cardiovascular: ( - ) palpitation, ( - ) chest discomfort, ( - ) lower extremity swelling Gastrointestinal:  ( - ) nausea, ( - ) heartburn, ( - ) change in bowel habits Skin: ( - ) abnormal skin rashes Lymphatics: ( - ) new lymphadenopathy, ( - ) easy bruising Neurological: ( - ) numbness, ( - ) tingling, ( - ) new weaknesses Behavioral/Psych: ( - ) mood change, ( - ) new changes  All other systems were reviewed with the patient and are negative.  PHYSICAL EXAMINATION: ECOG PERFORMANCE STATUS: 0 - Asymptomatic  Vitals:   12/06/19 1500  BP: 132/86  Pulse: 75  Resp: 18  Temp: 98 F (36.7 C)  SpO2: 98%   Filed Weights   12/06/19 1500  Weight: 270 lb 9.6 oz (122.7 kg)    GENERAL: well appearing middle aged male in NAD  SKIN: skin color, texture, turgor are normal, no rashes or significant lesions EYES: conjunctiva are pink and non-injected, sclera clear NECK: supple, non-tender  LYMPH:  no palpable lymphadenopathy in the cervical, axillary or supraclavicular lymph nodes.  LUNGS: clear to auscultation and percussion with normal breathing effort HEART: regular rate & rhythm and no murmurs and no lower extremity edema ABDOMEN: limited due to body habitus.  Musculoskeletal: no cyanosis of digits and no clubbing  PSYCH: alert & oriented x 3, fluent speech NEURO: no focal motor/sensory deficits  LABORATORY DATA:  I have reviewed the data as listed CBC Latest Ref Rng & Units 12/06/2019 07/25/2019 07/11/2019  WBC 4.0 - 10.5 K/uL 14.1(H) 13.4(H) 11.8(H)  Hemoglobin 13.0 - 17.0 g/dL 15.4 14.9 14.2  Hematocrit 39.0 - 52.0 % 47.2 45.1 43.4  Platelets 150 - 400 K/uL 314 348 357.0    CMP Latest Ref Rng & Units 07/25/2019 07/11/2019 10/13/2018  Glucose 70 - 99 mg/dL 117(H) 105(H) 98  BUN 6 - 20 mg/dL '17 15 21  '$ Creatinine 0.61 - 1.24 mg/dL 0.98 0.99 1.15  Sodium 135 - 145  mmol/L 138 137 139  Potassium 3.5 - 5.1 mmol/L 4.5 4.7 4.3  Chloride 98 - 111 mmol/L 102 100 98  CO2 22 - 32 mmol/L 27 31 34(H)  Calcium 8.9 - 10.3 mg/dL 9.0 9.2 9.8  Total Protein 6.5 - 8.1 g/dL 6.9 6.3 7.1  Total Bilirubin 0.3 - 1.2 mg/dL 0.4 0.5 0.4  Alkaline Phos 38 - 126 U/L 61 59 76  AST 15 - 41 U/L '16 21 28  '$ ALT 0 - 44 U/L 22 22 39    BLOOD FILM: None today.   RADIOGRAPHIC STUDIES: I have personally reviewed the radiological images as listed and agreed with the findings in the report: no evidence of lymphadenopathy or splenomegaly.  No results found.  ASSESSMENT & PLAN Ryann Leavitt 49 y.o. male with medical history significant for CLL Rai Stage 0 presents for a follow up visit. The patient's last visit was on 07/25/2019 at which time he established care in our clinic.  After review of the imaging and the lab work the patient's findings are most consistent with CLL.  Given that he only has a lymphocytosis this would be considered a CLL Rai stage 0.  There is no clear indication for treatment at this time and I would recommend that he enter into active surveillance with close clinical monitoring and strict return precautions.  Today Terry [name] notes he has been well in the interim He has started taking testosterone shots and has completed 2 doses. He reports an improvement in his energy. He is otherwise asymptomatic with no weight loss, fevers, chills, sweats, or new lymphadenopathy.   Previously we discussed the nature of CLL, the relatively good prognosis, the meaning of his a 17p13 deletion, and possible treatments moving forward.  We also discussed that this tends to be a more indolent cancer and that close monitoring which is appropriate at this time.  We also discussed the criteria for which we would want to start treatment including bulky lymphadenopathy, anemia, or thrombocytopenia.  Terry [name] and his wife expressed their understanding of the disease and the plan moving  forward.  In the event treatment is required could consider therapy with ibrutinib +/- rituximab, Acalabrutinib +/- obinutuzumab, or Venetoclax +obinutuzumab. Of these treatment options only Venetoclax/obinutuzumab would be of limited duration (12 months). We can discuss these treatments in detail when the time comes.   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 Will continue to monitor in clinic with serial visits and blood work to assess for progression of the disease.  --plan for visits q6 months with CBC, CMP, LDH, and lymph node exam --Plan to have patient return in 6 months with PRN visits if new symptoms were to develop.   No orders of the defined types were placed in this encounter.  All questions were answered. The patient knows to call the clinic with any problems, questions or concerns.  A total of more than 30 minutes were spent on this encounter and over half of that time was spent on counseling and coordination of care as outlined above.   Ledell Peoples, MD Department of Hematology/Oncology [place] [facility] Phone: [phone] Pager: [phone] Email: [email or web address]  12/06/2019 3:02 PM

## 2019-12-20 ENCOUNTER — Other Ambulatory Visit: Payer: Self-pay | Admitting: Family Medicine

## 2019-12-27 ENCOUNTER — Telehealth: Payer: Self-pay | Admitting: *Deleted

## 2019-12-27 NOTE — Telephone Encounter (Signed)
Dr Ethlyn Gallery received a fax from [facility] requesting a drug change request for [place].  Dr Ethlyn Gallery stated it looks like the Rx is no longer covered and she is not sure if the pt has used a savings card for this.  She stated if not, we will have to switch to Testosterone cypionate 200mg  every 3 weeks to start and recheck the levels in 3 months, one week before the injection is due.  Patient stated he had the Rx filled and this must have been prior to this and will contact our office if this is not covered in the future.  Message sent to PCP.

## 2019-12-28 NOTE — Telephone Encounter (Signed)
Great!

## 2020-01-13 ENCOUNTER — Other Ambulatory Visit: Payer: Self-pay | Admitting: Family Medicine

## 2020-03-25 ENCOUNTER — Other Ambulatory Visit: Payer: Self-pay | Admitting: Family Medicine

## 2020-03-25 DIAGNOSIS — E119 Type 2 diabetes mellitus without complications: Secondary | ICD-10-CM

## 2020-03-25 DIAGNOSIS — E1169 Type 2 diabetes mellitus with other specified complication: Secondary | ICD-10-CM

## 2020-03-25 DIAGNOSIS — E785 Hyperlipidemia, unspecified: Secondary | ICD-10-CM

## 2020-03-25 DIAGNOSIS — E1165 Type 2 diabetes mellitus with hyperglycemia: Secondary | ICD-10-CM

## 2020-06-23 ENCOUNTER — Encounter: Payer: Self-pay | Admitting: Family Medicine

## 2020-06-23 ENCOUNTER — Ambulatory Visit (INDEPENDENT_AMBULATORY_CARE_PROVIDER_SITE_OTHER): Payer: BC Managed Care – PPO | Admitting: Family Medicine

## 2020-06-23 ENCOUNTER — Other Ambulatory Visit: Payer: Self-pay

## 2020-06-23 VITALS — BP 146/88 | HR 88 | Temp 98.1°F | Resp 16 | Ht 69.0 in | Wt 281.6 lb

## 2020-06-23 DIAGNOSIS — I10 Essential (primary) hypertension: Secondary | ICD-10-CM

## 2020-06-23 DIAGNOSIS — E119 Type 2 diabetes mellitus without complications: Secondary | ICD-10-CM

## 2020-06-23 DIAGNOSIS — J310 Chronic rhinitis: Secondary | ICD-10-CM | POA: Diagnosis not present

## 2020-06-23 DIAGNOSIS — I499 Cardiac arrhythmia, unspecified: Secondary | ICD-10-CM | POA: Diagnosis not present

## 2020-06-23 DIAGNOSIS — R600 Localized edema: Secondary | ICD-10-CM | POA: Diagnosis not present

## 2020-06-23 MED ORDER — LISINOPRIL 5 MG PO TABS: 10.0000 mg | ORAL_TABLET | Freq: Every day | ORAL | 1 refills | Status: DC

## 2020-06-23 MED ORDER — FUROSEMIDE 20 MG PO TABS: ORAL_TABLET | ORAL | 1 refills | Status: DC

## 2020-06-23 MED ORDER — AZELASTINE HCL 0.1 % NA SOLN: 2.0000 | Freq: Two times a day (BID) | NASAL | 0 refills | Status: DC

## 2020-06-23 MED ORDER — MOMETASONE FUROATE 50 MCG/ACT NA SUSP: 2.0000 | Freq: Every day | NASAL | 0 refills | Status: DC

## 2020-06-23 NOTE — Progress Notes (Signed)
ACUTE VISIT  Chief Complaint  Patient presents with  . Leg Swelling   HPI: [name] is a 49 y.o. male with hx of CLL, DM II,HTN, and opioid dependency here today with above complaint.  LE's edema for 3-4 weeks. He has had edema in the past but just during hot weather. "Tightness" sensation when trying to cross his legs. Negative for CP, palpitation, LE pain, or skin lesions. Problem does not seem to be better in the morning, stable throughout the day.  Shortness of breath, which is not a new problem and attributed to weight gain. No associated wheezing or coughing. He denies history of asthma but it is listed on his medical history. Currently he is not on albuterol or Symbicort.  He sleeps on 1-2 pillows, normal for him. Nocturia x 3-4. He takes Flomax 0.4 mg daily for urinary frequency, which has helped some. No known history of OSA.  Negative for decreased urine output, foamy urine, and gross hematuria.  BP mildly elevated. States that his BP has been higher w/o medication. Home BP readings 140s/90s.  He is on lisinopril 5 mg daily.  Lab Results  Component Value Date   CREATININE 1.25 (H) 12/06/2019   BUN 16 12/06/2019   NA 140 12/06/2019   K 4.4 12/06/2019   CL 105 12/06/2019   CO2 26 12/06/2019   He is not checking BS's. He is on Metformin XR 500 mg 1 tablet daily.  Lab Results  Component Value Date   HGBA1C 6.6 (H) 09/13/2019   Nasal congestion worse in the am when he gets up. He is using nasal spray, Afrin, daily for 3-4 weeks.  It helps temporarily. No hx of allergies. For 2 weeks he has been taking Allegra, he has not helped. Negative for fever, chills, change in appetite, or sore throat.  Review of Systems  Constitutional: Positive for fatigue. Negative for activity change and appetite change.  HENT: Negative for mouth sores, nosebleeds and trouble swallowing.   Eyes: Negative for redness and visual disturbance.    Gastrointestinal: Negative for abdominal pain, nausea and vomiting.  Endocrine: Negative for cold intolerance and heat intolerance.  Skin: Negative for color change and wound.  Neurological: Negative for syncope, weakness and headaches.  Rest see pertinent positives and negatives per HPI.  Current Outpatient Medications on File Prior to Visit  Medication Sig Dispense Refill  . Albuterol Sulfate 108 (90 Base) MCG/ACT AEPB Inhale 1-2 puffs into the lungs every 4 (four) hours as needed. 1 each 2  . fluticasone-salmeterol (ADVAIR HFA) 115-21 MCG/ACT inhaler Inhale 2 puffs into the lungs 2 (two) times daily. 3 Inhaler 3  . metFORMIN (GLUCOPHAGE-XR) 500 MG 24 hr tablet TAKE 1 TABLET DAILY WITH BREAKFAST 90 tablet 1  . methadone (DOLOPHINE) 10 MG/ML solution Take 85 mg by mouth daily.     . pramipexole (MIRAPEX) 0.125 MG tablet Take 1 tablet (0.125 mg total) by mouth 3 (three) times daily. 270 tablet 0  . pravastatin (PRAVACHOL) 40 MG tablet TAKE 1 TABLET DAILY 90 tablet 1  . Spacer/Aero-Holding Chambers (E-Z SPACER) inhaler Use as instructed 1 each 2  . tamsulosin (FLOMAX) 0.4 MG CAPS capsule TAKE 1 CAPSULE DAILY 90 capsule 1  . traZODone (DESYREL) 50 MG tablet TAKE 1 TABLET AT BEDTIME AS NEEDED FOR SLEEP 90 tablet 3  . valACYclovir (VALTREX) 500 MG tablet TAKE 1 TABLET(500 MG) BY MOUTH DAILY 90 tablet 0  . XYOSTED 75 MG/0.5ML SOAJ INJECT 75 MG INTO THE  SKIN ONCE WEEKLY 2 mL 5   No current facility-administered medications on file prior to visit.   Past Medical History:  Diagnosis Date  . Asthma   . Hyperlipidemia   . Hypertension   . Opioid abuse ([place])    following with methadone clinic  . Pneumonia    No Known Allergies  Social History   Socioeconomic History  . Marital status: Married    Spouse name: Not on file  . Number of children: Not on file  . Years of education: Not on file  . Highest education level: Not on file  Occupational History  . Not on file  Tobacco Use  .  Smoking status: Never Smoker  . Smokeless tobacco: Never Used  Vaping Use  . Vaping Use: Never used  Substance and Sexual Activity  . Alcohol use: Yes  . Drug use: Yes    Types: Marijuana  . Sexual activity: Yes  Other Topics Concern  . Not on file  Social History Narrative  . Not on file   Social Determinants of Health   Financial Resource Strain:   . Difficulty of Paying Living Expenses: Not on file  Food Insecurity:   . Worried About Charity fundraiser in the Last Year: Not on file  . Ran Out of Food in the Last Year: Not on file  Transportation Needs:   . Lack of Transportation (Medical): Not on file  . Lack of Transportation (Non-Medical): Not on file  Physical Activity:   . Days of Exercise per Week: Not on file  . Minutes of Exercise per Session: Not on file  Stress:   . Feeling of Stress : Not on file  Social Connections:   . Frequency of Communication with Friends and Family: Not on file  . Frequency of Social Gatherings with Friends and Family: Not on file  . Attends Religious Services: Not on file  . Active Member of Clubs or Organizations: Not on file  . Attends Archivist Meetings: Not on file  . Marital Status: Not on file   Vitals:   06/23/20 1639  BP: (!) 146/88  Pulse: 88  Resp: 16  Temp: 98.1 F (36.7 C)  SpO2: 93%   Body mass index is 41.59 kg/m.  Physical Exam Nursing note reviewed.  Constitutional:      General: He is not in acute distress.    Appearance: He is well-developed.  HENT:     Head: Normocephalic and atraumatic.     Nose: Septal deviation present. No mucosal edema.     Left Nostril: Occlusion present.     Right Turbinates: Enlarged.     Left Turbinates: Enlarged and swollen.     Right Sinus: No maxillary sinus tenderness or frontal sinus tenderness.     Left Sinus: No maxillary sinus tenderness or frontal sinus tenderness.     Comments: Mouth breathing.    Mouth/Throat:     Mouth: Mucous membranes are moist.      Pharynx: Oropharynx is clear.  Eyes:     Conjunctiva/sclera: Conjunctivae normal.     Pupils: Pupils are equal, round, and reactive to light.  Cardiovascular:     Rate and Rhythm: Normal rate and regular rhythm. Occasional extrasystoles are present.    Pulses:          Dorsalis pedis pulses are 2+ on the right side and 2+ on the left side.     Heart sounds: Murmur (? Soft SEM LUSB) heard.  Pulmonary:     Effort: Pulmonary effort is normal. No respiratory distress.     Breath sounds: Normal breath sounds.  Abdominal:     Palpations: Abdomen is soft. There is no mass.     Tenderness: There is no abdominal tenderness.  Musculoskeletal:     Right lower leg: 2+ Pitting Edema present.     Left lower leg: 2+ Pitting Edema present.  Lymphadenopathy:     Cervical: No cervical adenopathy.  Skin:    General: Skin is warm.     Findings: No erythema or rash.  Neurological:     Mental Status: He is alert and oriented to person, place, and time.     Cranial Nerves: No cranial nerve deficit.     Gait: Gait normal.  Psychiatric:     Comments: Well groomed, good eye contact.    ASSESSMENT AND PLAN:  Mr.Rena was seen today for leg swelling.  Diagnoses and all orders for this visit: Orders Placed This Encounter  Procedures  . BASIC METABOLIC PANEL WITH GFR  . Brain Natriuretic Peptide  . EKG 12-Lead    Bilateral lower extremity edema We discussed possible etiologies. History and examination today do not suggest an acute serious process. ?  Vein disease. LE elevation above heart level and compression stockings may help. We discussed some side effects of furosemide.  -     furosemide (LASIX) 20 MG tablet; 1 tab 2 times daily for 5 days then 1 tab daily.  Essential hypertension BP is not adequately controlled. Recommend increasing lisinopril from 5 mg to 10 mg. Continue monitoring BP at home. Low-salt diet.  -     lisinopril (ZESTRIL) 5 MG tablet; Take 2 tablets (10 mg  total) by mouth daily.  Irregular heart rate No prior history. EKG today: SR,normal axis and intervals. No other EKG for comparison. Instructed about warning signs.  Rhinitis, unspecified type We discussed some side effects of Afrin, instructed to stop it now. Nasal irrigation with saline as needed. Flonase has not helped, so recommend Nasonex nasal spray and Astelin nasal spray. Stop Allegra.  -     mometasone (NASONEX) 50 MCG/ACT nasal spray; Place 2 sprays into the nose daily. -     azelastine (ASTELIN) 0.1 % nasal spray; Place 2 sprays into both nostrils 2 (two) times daily. Use in each nostril as directed  Controlled type 2 diabetes mellitus without complication, without long-term current use of insulin (HCC) He is not checking BS's. Follow with PCP.  Morbid obesity ([place]) This problem could be a contributing factor for some of his symptoms. Strongly recommend weight loss. I think he needs to be evaluated for OSA.  Spent 50 minutes. During this time history was obtained and documented, examination was performed, prior labs, and assessment/plan discussed. Lab is already closed,so lab appt arranged for tomorrow.  Return in about 10 days (around 07/03/2020) for Nasla congestion, LE edema,and HTN with PCP. Labs tomorrow..  Darcia Lampi G. [place], MD  [facility]. [place] office.   A few things to remember from today's visit:  Bilateral lower extremity edema - Plan: BASIC METABOLIC PANEL WITH GFR, Brain Natriuretic Peptide  Essential hypertension - Plan: BASIC METABOLIC PANEL WITH GFR  Irregular heart rate - Plan: EKG 12-Lead  Rhinitis, unspecified type  Leg swelling does not seem to be worrisome at this time, ? Vein disease. Elevation of legs above heart level a few times during the day and compression stockings may help.  Fluid pill started today. Furosemide  to take 1 in the morning and another one at noon for 5 days then 1 tab daily. Low salt diet.  If you  need refills please call your pharmacy. Do not use My Chart to request refills or for acute issues that need immediate attention.   Nasal saline irrigations with saline water as needed. Stop Afrin. Nasonex and Astelin started today. Stop Allegra. Avoid medications like Ibuprofen and naproxen.   Please be sure medication list is accurate. If a new problem present, please set up appointment sooner than planned today.

## 2020-06-23 NOTE — Patient Instructions (Addendum)
A few things to remember from today's visit:  Bilateral lower extremity edema - Plan: BASIC METABOLIC PANEL WITH GFR, Brain Natriuretic Peptide  Essential hypertension - Plan: BASIC METABOLIC PANEL WITH GFR  Irregular heart rate - Plan: EKG 12-Lead  Rhinitis, unspecified type  Leg swelling does not seem to be worrisome at this time, ? Vein disease. Elevation of legs above heart level a few times during the day and compression stockings may help.  Fluid pill started today. Furosemide to take 1 in the morning and another one at noon for 5 days then 1 tab daily. Low salt diet.  If you need refills please call your pharmacy. Do not use My Chart to request refills or for acute issues that need immediate attention.   Nasal saline irrigations with saline water as needed. Stop Afrin. Nasonex and Astelin started today. Stop Allegra. Avoid medications like Ibuprofen and naproxen.   Please be sure medication list is accurate. If a new problem present, please set up appointment sooner than planned today.

## 2020-06-24 ENCOUNTER — Other Ambulatory Visit (INDEPENDENT_AMBULATORY_CARE_PROVIDER_SITE_OTHER): Payer: BC Managed Care – PPO

## 2020-06-24 DIAGNOSIS — R600 Localized edema: Secondary | ICD-10-CM | POA: Diagnosis not present

## 2020-06-24 DIAGNOSIS — I10 Essential (primary) hypertension: Secondary | ICD-10-CM | POA: Diagnosis not present

## 2020-06-24 LAB — BASIC METABOLIC PANEL WITH GFR
BUN: 14 mg/dL (ref 7–25)
CO2: 32 mmol/L (ref 20–32)
Calcium: 9.7 mg/dL (ref 8.6–10.3)
Chloride: 98 mmol/L (ref 98–110)
Creat: 1.14 mg/dL (ref 0.60–1.35)
GFR, Est African American: 89 mL/min/{1.73_m2} (ref >=60)
GFR, Est Non African American: 77 mL/min/{1.73_m2} (ref >=60)
Glucose, Bld: 118 mg/dL — ABNORMAL HIGH (ref 65–99)
Potassium: 5.1 mmol/L (ref 3.5–5.3)
Sodium: 135 mmol/L (ref 135–146)

## 2020-06-25 LAB — BRAIN NATRIURETIC PEPTIDE: Brain Natriuretic Peptide: 4 pg/mL (ref <100)

## 2020-06-27 ENCOUNTER — Other Ambulatory Visit: Payer: Self-pay | Admitting: Family Medicine

## 2020-07-04 ENCOUNTER — Inpatient Hospital Stay: Payer: BC Managed Care – PPO

## 2020-07-04 ENCOUNTER — Other Ambulatory Visit: Payer: Self-pay

## 2020-07-04 ENCOUNTER — Inpatient Hospital Stay: Payer: BC Managed Care – PPO | Attending: Hematology and Oncology | Admitting: Hematology and Oncology

## 2020-07-04 ENCOUNTER — Other Ambulatory Visit: Payer: Self-pay | Admitting: Hematology and Oncology

## 2020-07-04 ENCOUNTER — Encounter: Payer: Self-pay | Admitting: Hematology and Oncology

## 2020-07-04 VITALS — BP 144/92 | HR 76 | Temp 97.8°F | Resp 18 | Ht 69.0 in | Wt 278.4 lb

## 2020-07-04 DIAGNOSIS — Z79899 Other long term (current) drug therapy: Secondary | ICD-10-CM | POA: Insufficient documentation

## 2020-07-04 DIAGNOSIS — C9110 Chronic lymphocytic leukemia of B-cell type not having achieved remission: Secondary | ICD-10-CM | POA: Insufficient documentation

## 2020-07-04 DIAGNOSIS — M7989 Other specified soft tissue disorders: Secondary | ICD-10-CM | POA: Insufficient documentation

## 2020-07-04 LAB — CBC WITH DIFFERENTIAL (CANCER CENTER ONLY)
Abs Immature Granulocytes: 0.05 10*3/uL (ref 0.00–0.07)
Basophils Absolute: 0.1 10*3/uL (ref 0.0–0.1)
Basophils Relative: 1 %
Eosinophils Absolute: 0.6 10*3/uL — ABNORMAL HIGH (ref 0.0–0.5)
Eosinophils Relative: 4 %
HCT: 49.7 % (ref 39.0–52.0)
Hemoglobin: 15.9 g/dL (ref 13.0–17.0)
Immature Granulocytes: 0 %
Lymphocytes Relative: 50 %
Lymphs Abs: 8.8 10*3/uL — ABNORMAL HIGH (ref 0.7–4.0)
MCH: 28.7 pg (ref 26.0–34.0)
MCHC: 32 g/dL (ref 30.0–36.0)
MCV: 89.7 fL (ref 80.0–100.0)
Monocytes Absolute: 0.9 10*3/uL (ref 0.1–1.0)
Monocytes Relative: 5 %
Neutro Abs: 6.9 10*3/uL (ref 1.7–7.7)
Neutrophils Relative %: 40 %
Platelet Count: 245 10*3/uL (ref 150–400)
RBC: 5.54 MIL/uL (ref 4.22–5.81)
RDW: 12.3 % (ref 11.5–15.5)
WBC Count: 17.4 10*3/uL — ABNORMAL HIGH (ref 4.0–10.5)
nRBC: 0 % (ref 0.0–0.2)

## 2020-07-04 LAB — CMP (CANCER CENTER ONLY)
ALT: 53 U/L — ABNORMAL HIGH (ref 0–44)
AST: 30 U/L (ref 15–41)
Albumin: 3.9 g/dL (ref 3.5–5.0)
Alkaline Phosphatase: 58 U/L (ref 38–126)
Anion gap: 9 (ref 5–15)
BUN: 16 mg/dL (ref 6–20)
CO2: 32 mmol/L (ref 22–32)
Calcium: 9.1 mg/dL (ref 8.9–10.3)
Chloride: 98 mmol/L (ref 98–111)
Creatinine: 1.34 mg/dL — ABNORMAL HIGH (ref 0.61–1.24)
GFR, Estimated: >60 mL/min (ref >60)
Glucose, Bld: 79 mg/dL (ref 70–99)
Potassium: 4.1 mmol/L (ref 3.5–5.1)
Sodium: 139 mmol/L (ref 135–145)
Total Bilirubin: 0.7 mg/dL (ref 0.3–1.2)
Total Protein: 7.1 g/dL (ref 6.5–8.1)

## 2020-07-04 LAB — LACTATE DEHYDROGENASE: LDH: 211 U/L — ABNORMAL HIGH (ref 98–192)

## 2020-07-04 NOTE — Progress Notes (Signed)
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 MCV 84.2. ANC 8600, ALC 1500 2) 10/13/2018: WBC 11.9, Hgb 13.8, Plt 342. MCV 84.7. [place] 5200, ALC 5000 3) 07/11/2019: WBC 11.8, Hgb 14.2, Plt 357, MCV 86.1. [place] 5300, ALC 5200 4) 07/25/2019: establish care with Dr. Lorenso Courier. Flow cytometry showed monoclonal B-cell population without expression of CD5 or CD10 comprises 59% of all lymphocytes. 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  5) 08/20/2019: CLL panel returned positive for 17p13 deletion and no evidence of t(11:14). Findings consistent with diagnosis of CLL. Entered in active surveillance.   Interval History:  Nassim Cosma 49 y.o. male with medical history significant for CLL Rai Stage 0 presents for a follow up visit. The patient's last visit was on 12/06/2019. In the interim since the last visit he has had no major changes in his health.   On exam today Mr. Buerkle has been well overall since his last visit.  He reports that he has seen his regular doctor because he is been having trouble waking up feeling stuffed up and also he is unable to lay flat at night.  He is having swelling of his legs and his physician started him on fluid pills.  He has "no idea" why he is picking up so much fluid.  He is attempting to lose weight but he is currently up to 278 pounds which is increased from prior.  This approximate 26 pounds from last year.  He notes he is always had a problem with shortness of breath and attributes this to his increased weight.  He otherwise denies having issues with fevers, chills, sweats, nausea, vomiting or diarrhea.  He denies having any palpable lymphadenopathy.  A full 10 point ROS is listed below.  MEDICAL  HISTORY:  Past Medical History:  Diagnosis Date  . Asthma   . Hyperlipidemia   . Hypertension   . Opioid abuse ([place])    following with methadone clinic  . Pneumonia     SURGICAL HISTORY: Past Surgical History:  Procedure Laterality Date  . right bicep Right 2013  . TRICEPS TENDON REPAIR Left 04/2013   ALLERGIES:  has No Known Allergies.  MEDICATIONS:  Current Outpatient Medications  Medication Sig Dispense Refill  . Albuterol Sulfate 108 (90 Base) MCG/ACT AEPB Inhale 1-2 puffs into the lungs every 4 (four) hours as needed. 1 each 2  . azelastine (ASTELIN) 0.1 % nasal spray Place 2 sprays into both nostrils 2 (two) times daily. Use in each nostril as directed 30 mL 0  . fluticasone-salmeterol (ADVAIR HFA) 115-21 MCG/ACT inhaler Inhale 2 puffs into the lungs 2 (two) times daily. 3 Inhaler 3  . furosemide (LASIX) 20 MG tablet 1 tab 2 times daily for 5 days then 1 tab daily. 30 tablet 1  . lisinopril (ZESTRIL) 5 MG tablet Take 2 tablets (10 mg total) by mouth daily. 90 tablet 1  . metFORMIN (GLUCOPHAGE-XR) 500 MG 24 hr tablet TAKE 1 TABLET DAILY WITH BREAKFAST 90 tablet 1  . methadone (DOLOPHINE) 10 MG/ML solution Take 85 mg by mouth daily.     . mometasone (NASONEX) 50 MCG/ACT nasal spray Place 2 sprays into the nose daily. 1 each 0  . pramipexole (MIRAPEX) 0.125 MG tablet Take 1 tablet (0.125 mg  total) by mouth 3 (three) times daily. 270 tablet 0  . pravastatin (PRAVACHOL) 40 MG tablet TAKE 1 TABLET DAILY 90 tablet 1  . Spacer/Aero-Holding Chambers (E-Z SPACER) inhaler Use as instructed 1 each 2  . tamsulosin (FLOMAX) 0.4 MG CAPS capsule TAKE 1 CAPSULE DAILY 90 capsule 1  . traZODone (DESYREL) 50 MG tablet TAKE 1 TABLET AT BEDTIME AS NEEDED FOR SLEEP 90 tablet 3  . valACYclovir (VALTREX) 500 MG tablet TAKE 1 TABLET(500 MG) BY MOUTH DAILY 90 tablet 0  . XYOSTED 75 MG/0.5ML SOAJ INJECT 75 MG INTO THE SKIN ONCE WEEKLY 2 mL 5   No current facility-administered medications for this  visit.    REVIEW OF SYSTEMS:   Constitutional: ( - ) fevers, ( - )  chills , ( - ) night sweats Eyes: ( - ) blurriness of vision, ( - ) double vision, ( - ) watery eyes Ears, nose, mouth, throat, and face: ( - ) mucositis, ( - ) sore throat Respiratory: ( - ) cough, ( - ) dyspnea, ( - ) wheezes Cardiovascular: ( - ) palpitation, ( - ) chest discomfort, ( - ) lower extremity swelling Gastrointestinal:  ( - ) nausea, ( - ) heartburn, ( - ) change in bowel habits Skin: ( - ) abnormal skin rashes Lymphatics: ( - ) new lymphadenopathy, ( - ) easy bruising Neurological: ( - ) numbness, ( - ) tingling, ( - ) new weaknesses Behavioral/Psych: ( - ) mood change, ( - ) new changes  All other systems were reviewed with the patient and are negative.  PHYSICAL EXAMINATION: ECOG PERFORMANCE STATUS: 0 - Asymptomatic  Vitals:   07/04/20 1013  BP: (!) 144/92  Pulse: 76  Resp: 18  Temp: 97.8 F (36.6 C)  SpO2: 95%   Filed Weights   07/04/20 1013  Weight: 278 lb 6.4 oz (126.3 kg)    GENERAL: well appearing middle aged male in NAD  SKIN: skin color, texture, turgor are normal, no rashes or significant lesions EYES: conjunctiva are pink and non-injected, sclera clear NECK: supple, non-tender  LYMPH:  no palpable lymphadenopathy in the cervical, axillary or supraclavicular lymph nodes.  LUNGS: clear to auscultation and percussion with normal breathing effort HEART: regular rate & rhythm and no murmurs and no lower extremity edema ABDOMEN: limited due to body habitus.  Musculoskeletal: no cyanosis of digits and no clubbing  PSYCH: alert & oriented x 3, fluent speech NEURO: no focal motor/sensory deficits  LABORATORY DATA:  I have reviewed the data as listed CBC Latest Ref Rng & Units 07/04/2020 12/06/2019 07/25/2019  WBC 4.0 - 10.5 K/uL 17.4(H) 14.1(H) 13.4(H)  Hemoglobin 13.0 - 17.0 g/dL 15.9 15.4 14.9  Hematocrit 39 - 52 % 49.7 47.2 45.1  Platelets 150 - 400 K/uL 245 314 348    CMP  Latest Ref Rng & Units 06/24/2020 12/06/2019 07/25/2019  Glucose 65 - 99 mg/dL 118(H) 89 117(H)  BUN 7 - 25 mg/dL 14 16 17   Creatinine 0.60 - 1.35 mg/dL 1.14 1.25(H) 0.98  Sodium 135 - 146 mmol/L 135 140 138  Potassium 3.5 - 5.3 mmol/L 5.1 4.4 4.5  Chloride 98 - 110 mmol/L 98 105 102  CO2 20 - 32 mmol/L 32 26 27  Calcium 8.6 - 10.3 mg/dL 9.7 8.8(L) 9.0  Total Protein 6.5 - 8.1 g/dL - 6.9 6.9  Total Bilirubin 0.3 - 1.2 mg/dL - 0.7 0.4  Alkaline Phos 38 - 126 U/L - 65 61  AST 15 - 41  U/L - 24 16  ALT 0 - 44 U/L - 35 22    BLOOD FILM: None today.   RADIOGRAPHIC STUDIES: I have personally reviewed the radiological images as listed and agreed with the findings in the report: no evidence of lymphadenopathy or splenomegaly.   No results found.  ASSESSMENT & PLAN Daimian Sudberry 49 y.o. male with medical history significant for CLL Rai Stage 0 presents for a follow up visit. The patient's last visit was on 07/25/2019 at which time he established care in our clinic.  After review of the imaging and the lab work the patient's findings are most consistent with CLL.  Given that he only has a lymphocytosis this would be considered a CLL Rai stage 0.  There is no clear indication for treatment at this time and I would recommend that he enter into active surveillance with close clinical monitoring and strict return precautions.  Today Mr. Kihara notes he has been well in the interim he has been having issues with swelling which is currently being followed by his PCP. He is otherwise asymptomatic with no weight loss, fevers, chills, sweats, or new lymphadenopathy.   Previously we discussed the nature of CLL, the relatively good prognosis, the meaning of his a 17p13 deletion, and possible treatments moving forward.  We also discussed that this tends to be a more indolent cancer and that close monitoring which is appropriate at this time.  We also discussed the criteria for which we would want to start  treatment including bulky lymphadenopathy, anemia, or thrombocytopenia.  Mr. Bowen and his wife expressed their understanding of the disease and the plan moving forward.  In the event treatment is required could consider therapy with ibrutinib +/- rituximab, Acalabrutinib +/- obinutuzumab, or Venetoclax +obinutuzumab. Of these treatment options only Venetoclax/obinutuzumab would be of limited duration (12 months). We can discuss these treatments in detail when the time comes.   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 Will continue to monitor in clinic with serial visits and blood work to assess for progression of the disease.  --plan for labs q6 months with CBC, CMP, LDH --Plan to have patient return in 6 months with PRN visits if new symptoms were to develop.   #Lower Extremity Swelling #Orthopnea --currently undergoing evaluation by PCP. Started on lasix therapy --reports testing for OSA is under consideration. --management per PCP.   No orders of the defined types were placed in this encounter.  All questions were answered. The patient knows to call the clinic with any problems, questions or concerns.  A total of more than 30 minutes were spent on this encounter and over half of that time was spent on counseling and coordination of care as outlined above.   Ledell Peoples, MD Department of Hematology/Oncology [place] [facility] Phone: [phone] Pager: [phone] Email: [email or web address]  07/04/2020 10:22 AM

## 2020-07-09 ENCOUNTER — Telehealth: Payer: Self-pay | Admitting: Hematology and Oncology

## 2020-07-09 NOTE — Telephone Encounter (Signed)
Scheduled per los. Called and left msg. Mailed printout  °

## 2020-07-10 ENCOUNTER — Other Ambulatory Visit: Payer: Self-pay | Admitting: Family Medicine

## 2020-07-21 ENCOUNTER — Other Ambulatory Visit: Payer: Self-pay | Admitting: Family Medicine

## 2020-07-21 DIAGNOSIS — J310 Chronic rhinitis: Secondary | ICD-10-CM

## 2020-07-29 ENCOUNTER — Encounter: Payer: Self-pay | Admitting: Family Medicine

## 2020-07-29 ENCOUNTER — Ambulatory Visit (INDEPENDENT_AMBULATORY_CARE_PROVIDER_SITE_OTHER): Payer: BC Managed Care – PPO | Admitting: Family Medicine

## 2020-07-29 ENCOUNTER — Other Ambulatory Visit: Payer: Self-pay

## 2020-07-29 ENCOUNTER — Other Ambulatory Visit (INDEPENDENT_AMBULATORY_CARE_PROVIDER_SITE_OTHER): Payer: BC Managed Care – PPO

## 2020-07-29 VITALS — BP 148/90 | HR 80 | Ht 69.0 in | Wt 285.0 lb

## 2020-07-29 DIAGNOSIS — I10 Essential (primary) hypertension: Secondary | ICD-10-CM | POA: Diagnosis not present

## 2020-07-29 DIAGNOSIS — R600 Localized edema: Secondary | ICD-10-CM | POA: Diagnosis not present

## 2020-07-29 DIAGNOSIS — E7800 Pure hypercholesterolemia, unspecified: Secondary | ICD-10-CM

## 2020-07-29 DIAGNOSIS — R5383 Other fatigue: Secondary | ICD-10-CM

## 2020-07-29 DIAGNOSIS — R0600 Dyspnea, unspecified: Secondary | ICD-10-CM

## 2020-07-29 DIAGNOSIS — R400 Somnolence: Secondary | ICD-10-CM

## 2020-07-29 DIAGNOSIS — E119 Type 2 diabetes mellitus without complications: Secondary | ICD-10-CM | POA: Diagnosis not present

## 2020-07-29 LAB — CBC WITH DIFFERENTIAL/PLATELET
Basophils Absolute: 0.1 10*3/uL (ref 0.0–0.1)
Basophils Relative: 0.6 % (ref 0.0–3.0)
Eosinophils Absolute: 0.6 10*3/uL (ref 0.0–0.7)
Eosinophils Relative: 4.2 % (ref 0.0–5.0)
HCT: 49.4 % (ref 39.0–52.0)
Hemoglobin: 16.3 g/dL (ref 13.0–17.0)
Lymphocytes Relative: 43.7 % (ref 12.0–46.0)
Lymphs Abs: 5.9 10*3/uL — ABNORMAL HIGH (ref 0.7–4.0)
MCHC: 33 g/dL (ref 30.0–36.0)
MCV: 87.7 fl (ref 78.0–100.0)
Monocytes Absolute: 0.7 10*3/uL (ref 0.1–1.0)
Monocytes Relative: 4.9 % (ref 3.0–12.0)
Neutro Abs: 6.3 10*3/uL (ref 1.4–7.7)
Neutrophils Relative %: 46.6 % (ref 43.0–77.0)
Platelets: 262 10*3/uL (ref 150.0–400.0)
RBC: 5.64 Mil/uL (ref 4.22–5.81)
RDW: 13.4 % (ref 11.5–15.5)
WBC: 13.4 10*3/uL — ABNORMAL HIGH (ref 4.0–10.5)

## 2020-07-29 LAB — LIPID PANEL
Cholesterol: 145 mg/dL (ref 0–200)
HDL: 33.2 mg/dL — ABNORMAL LOW (ref >39.00)
LDL Cholesterol: 79 mg/dL (ref 0–99)
NonHDL: 111.75
Total CHOL/HDL Ratio: 4
Triglycerides: 162 mg/dL — ABNORMAL HIGH (ref 0.0–149.0)
VLDL: 32.4 mg/dL (ref 0.0–40.0)

## 2020-07-29 LAB — COMPREHENSIVE METABOLIC PANEL
ALT: 31 U/L (ref 0–53)
AST: 22 U/L (ref 0–37)
Albumin: 4.2 g/dL (ref 3.5–5.2)
Alkaline Phosphatase: 51 U/L (ref 39–117)
BUN: 12 mg/dL (ref 6–23)
CO2: 33 mEq/L — ABNORMAL HIGH (ref 19–32)
Calcium: 9 mg/dL (ref 8.4–10.5)
Chloride: 100 mEq/L (ref 96–112)
Creatinine, Ser: 1.24 mg/dL (ref 0.40–1.50)
GFR: 69.45 mL/min (ref >60.00)
Glucose, Bld: 91 mg/dL (ref 70–99)
Potassium: 4.5 mEq/L (ref 3.5–5.1)
Sodium: 138 mEq/L (ref 135–145)
Total Bilirubin: 0.9 mg/dL (ref 0.2–1.2)
Total Protein: 6.6 g/dL (ref 6.0–8.3)

## 2020-07-29 LAB — HEMOGLOBIN A1C: Hgb A1c MFr Bld: 6.7 % — ABNORMAL HIGH (ref 4.6–6.5)

## 2020-07-29 MED ORDER — TORSEMIDE 10 MG PO TABS: 10.0000 mg | ORAL_TABLET | Freq: Every day | ORAL | 1 refills | Status: DC

## 2020-07-29 NOTE — Progress Notes (Signed)
Established Patient Office Visit  Subjective:  Patient ID: Terry [name], male    DOB: [birth year]  Age: 49 y.o. MRN: [id number]  CC:  Chief Complaint  Patient presents with  . Edema    HPI Terry [name] presents for persistent and really worsening bilateral leg edema which has been present now for least couple months.  His chronic problems include history of hypertension, asthma, CLL, history of chronic opioid dependence, hyperlipidemia, restless leg syndrome.  He states he had onset of increasing edema about 2 months ago.  He was seen here November 22 placed on Lasix 20 mg twice daily he did not see much improvement at all with that.  He states he has also had some increased fatigue and possibly some increased shortness of breath as well.  No cough or wheeze.  He had lab work earlier today.  Hemoglobin stable.  Chronically elevated white count but stable.  Comprehensive metabolic panel revealed stable renal function and normal electrolytes.  He has frequent snoring at night, observed episodes of occasional gasping for air at night and increasing daytime somnolence.  Never studied for sleep apnea.  Past Medical History:  Diagnosis Date  . Asthma   . Hyperlipidemia   . Hypertension   . Opioid abuse ([place])    following with methadone clinic  . Pneumonia     Past Surgical History:  Procedure Laterality Date  . right bicep Right 2013  . TRICEPS TENDON REPAIR Left 04/2013    Family History  Problem Relation Age of Onset  . Diabetes Mother   . AAA (abdominal aortic aneurysm) Mother 50  . Blindness Mother   . Diabetes Father   . Stroke Father 33  . Heart attack Father   . Drug abuse Sister   . Drug abuse Brother   . Brain cancer Maternal Grandmother 60  . Diabetes Paternal Grandmother   . Stroke Paternal Grandmother   . Diabetes Paternal Grandfather 19    Social History   Socioeconomic History  . Marital status: Married    Spouse name: Not on file  .  Number of children: Not on file  . Years of education: Not on file  . Highest education level: Not on file  Occupational History  . Not on file  Tobacco Use  . Smoking status: Never Smoker  . Smokeless tobacco: Never Used  Vaping Use  . Vaping Use: Never used  Substance and Sexual Activity  . Alcohol use: Yes  . Drug use: Yes    Types: Marijuana  . Sexual activity: Yes  Other Topics Concern  . Not on file  Social History Narrative  . Not on file   Social Determinants of Health   Financial Resource Strain: Not on file  Food Insecurity: Not on file  Transportation Needs: Not on file  Physical Activity: Not on file  Stress: Not on file  Social Connections: Not on file  Intimate Partner Violence: Not on file    Outpatient Medications Prior to Visit  Medication Sig Dispense Refill  . XYOSTED 75 MG/0.5ML SOAJ INJECT 75MG (0.5ML) INTO THE SKIN ONCE WEEKLY 2 mL 2  . Albuterol Sulfate 108 (90 Base) MCG/ACT AEPB Inhale 1-2 puffs into the lungs every 4 (four) hours as needed. 1 each 2  . azelastine (ASTELIN) 0.1 % nasal spray Place 2 sprays into both nostrils 2 (two) times daily. Use in each nostril as directed 30 mL 0  . fluticasone-salmeterol (ADVAIR HFA) 115-21 MCG/ACT inhaler Inhale 2 puffs into  the lungs 2 (two) times daily. 3 Inhaler 3  . lisinopril (ZESTRIL) 5 MG tablet Take 2 tablets (10 mg total) by mouth daily. 90 tablet 1  . metFORMIN (GLUCOPHAGE-XR) 500 MG 24 hr tablet TAKE 1 TABLET DAILY WITH BREAKFAST 90 tablet 1  . methadone (DOLOPHINE) 10 MG/ML solution Take 85 mg by mouth daily.     . mometasone (NASONEX) 50 MCG/ACT nasal spray SHAKE LIQUID AND USE 2 SPRAYS IN EACH NOSTRIL DAILY 17 g 0  . pramipexole (MIRAPEX) 0.125 MG tablet Take 1 tablet (0.125 mg total) by mouth 3 (three) times daily. 270 tablet 0  . pravastatin (PRAVACHOL) 40 MG tablet TAKE 1 TABLET DAILY 90 tablet 1  . Spacer/Aero-Holding Chambers (E-Z SPACER) inhaler Use as instructed 1 each 2  . tamsulosin  (FLOMAX) 0.4 MG CAPS capsule TAKE 1 CAPSULE DAILY 90 capsule 1  . traZODone (DESYREL) 50 MG tablet TAKE 1 TABLET AT BEDTIME AS NEEDED FOR SLEEP 90 tablet 3  . valACYclovir (VALTREX) 500 MG tablet TAKE 1 TABLET(500 MG) BY MOUTH DAILY 90 tablet 0  . furosemide (LASIX) 20 MG tablet 1 tab 2 times daily for 5 days then 1 tab daily. 30 tablet 1   No facility-administered medications prior to visit.    No Known Allergies  ROS Review of Systems  Constitutional: Positive for fatigue. Negative for chills and fever.  Respiratory: Positive for shortness of breath. Negative for cough.   Cardiovascular: Positive for leg swelling. Negative for chest pain.  Gastrointestinal: Negative for abdominal pain.  Endocrine: Negative for polydipsia and polyuria.  Psychiatric/Behavioral: Positive for sleep disturbance.      Objective:    Physical Exam Vitals reviewed.  Cardiovascular:     Rate and Rhythm: Normal rate and regular rhythm.  Pulmonary:     Effort: Pulmonary effort is normal.     Breath sounds: Normal breath sounds.  Musculoskeletal:     Cervical back: Neck supple.     Comments: 1+ pitting edema lower legs bilaterally up to the knees.  Neurological:     Mental Status: He is alert.     BP (!) 148/90   Pulse 80   Ht 5\' 9"  (1.753 m)   Wt 285 lb (129.3 kg)   SpO2 93%   BMI 42.09 kg/m  Wt Readings from Last 3 Encounters:  07/29/20 285 lb (129.3 kg)  07/04/20 278 lb 6.4 oz (126.3 kg)  06/23/20 281 lb 9.6 oz (127.7 kg)     Health Maintenance Due  Topic Date Due  . COVID-19 Vaccine (1) Never done  . TETANUS/TDAP  Never done  . COLONOSCOPY (Pts 45-58yrs Insurance coverage will need to be confirmed)  Never done  . INFLUENZA VACCINE  03/02/2020    There are no preventive care reminders to display for this patient.  Lab Results  Component Value Date   TSH 2.951 07/25/2019   Lab Results  Component Value Date   WBC 13.4 (H) 07/29/2020   HGB 16.3 07/29/2020   HCT 49.4  07/29/2020   MCV 87.7 07/29/2020   PLT 262.0 07/29/2020   Lab Results  Component Value Date   NA 138 07/29/2020   K 4.5 07/29/2020   CO2 33 (H) 07/29/2020   GLUCOSE 91 07/29/2020   BUN 12 07/29/2020   CREATININE 1.24 07/29/2020   BILITOT 0.9 07/29/2020   ALKPHOS 51 07/29/2020   AST 22 07/29/2020   ALT 31 07/29/2020   PROT 6.6 07/29/2020   ALBUMIN 4.2 07/29/2020   CALCIUM 9.0  07/29/2020   ANIONGAP 9 07/04/2020   GFR 69.45 07/29/2020   Lab Results  Component Value Date   CHOL 145 07/29/2020   Lab Results  Component Value Date   HDL 33.20 (L) 07/29/2020   Lab Results  Component Value Date   LDLCALC 79 07/29/2020   Lab Results  Component Value Date   TRIG 162.0 (H) 07/29/2020   Lab Results  Component Value Date   CHOLHDL 4 07/29/2020   Lab Results  Component Value Date   HGBA1C 6.7 (H) 07/29/2020      Assessment & Plan:   Progressive bilateral leg edema over the past couple months.  He does have some pitting edema.  Strong clinical suspicion for obstructive sleep apnea.  Hypertension also poorly controlled today.  We recommended the following  -Set up referral for sleep studies to rule out obstructive sleep apnea -We will try switching over from furosemide to torsemide 10 mg once daily -Add TSH, urine dipstick, BNP level to labs done earlier today -Set up echocardiogram -Recommend close follow-up with primary within the next few weeks to reassess  Meds ordered this encounter  Medications  . torsemide (DEMADEX) 10 MG tablet    Sig: Take 1 tablet (10 mg total) by mouth daily.    Dispense:  30 tablet    Refill:  1    Follow-up: No follow-ups on file.    Carolann Littler, MD

## 2020-07-29 NOTE — Patient Instructions (Signed)
Stop the furosemide  Start torsemide 10 mg by mouth once daily  Increase potassium rich foods-example bananas, white beans, potatoes, [place], avocado, other fruits  We are setting up echocardiogram to further assess the shortness of breath and bilateral leg edema and also setting up sleep study referral to rule out obstructive sleep apnea

## 2020-07-31 ENCOUNTER — Other Ambulatory Visit: Payer: BC Managed Care – PPO

## 2020-08-14 ENCOUNTER — Other Ambulatory Visit: Payer: Self-pay | Admitting: Family Medicine

## 2020-08-14 DIAGNOSIS — R600 Localized edema: Secondary | ICD-10-CM

## 2020-08-14 DIAGNOSIS — R0600 Dyspnea, unspecified: Secondary | ICD-10-CM

## 2020-08-20 ENCOUNTER — Other Ambulatory Visit: Payer: Self-pay | Admitting: Family Medicine

## 2020-08-20 DIAGNOSIS — J310 Chronic rhinitis: Secondary | ICD-10-CM

## 2020-08-24 ENCOUNTER — Ambulatory Visit (HOSPITAL_COMMUNITY)
Admission: EM | Admit: 2020-08-24 | Discharge: 2020-08-24 | Disposition: A | Discharge status: Home / Self Care | Payer: BC Managed Care – PPO

## 2020-08-24 ENCOUNTER — Encounter (HOSPITAL_COMMUNITY): Payer: Self-pay

## 2020-08-24 ENCOUNTER — Other Ambulatory Visit: Payer: Self-pay

## 2020-08-24 DIAGNOSIS — J45901 Unspecified asthma with (acute) exacerbation: Secondary | ICD-10-CM

## 2020-08-24 MED ORDER — PREDNISONE 10 MG (21) PO TBPK: ORAL_TABLET | Freq: Every day | ORAL | 0 refills | Status: DC

## 2020-08-24 MED ORDER — ALBUTEROL SULFATE HFA 108 (90 BASE) MCG/ACT IN AERS: 1.0000 | INHALATION_SPRAY | Freq: Four times a day (QID) | RESPIRATORY_TRACT | 1 refills | Status: DC | PRN

## 2020-08-24 NOTE — ED Provider Notes (Signed)
[facility]    CSN: [id number] Arrival date & time: 08/24/20  1437      History   Chief Complaint Chief Complaint  Patient presents with  . Shortness of Breath    HPI Terry [name] is a 50 y.o. male presenting with wheezing and shortness of breath on exertion x1 week. History of asthma typically well controlled on albuterol inhaler- but he is out of this. States he typically gets asthma exacerbation this time of year. Denies URI sx- Denies fevers/chills, n/v/d, chest pain, cough, congestion, facial pain, teeth pain, headaches, sore throat, loss of taste/smell, swollen lymph nodes, ear pain. Denies chest pain,  confusion, high fevers.    HPI  Past Medical History:  Diagnosis Date  . Asthma   . Hyperlipidemia   . Hypertension   . Opioid abuse ([place])    following with methadone clinic  . Pneumonia     Patient Active Problem List   Diagnosis Date Noted  . CLL (chronic lymphocytic leukemia) ([place]) 12/06/2019  . Opioid dependence ([place]) 07/12/2019  . Restless leg syndrome 07/03/2019  . Chronic insomnia 07/03/2019  . 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 Asthma, chronic, unspecified asthma severity, with acute exacerbation 09/24/2017  . Sinus tachycardia   . HTN (hypertension) 10/11/2012  . Pure hypercholesterolemia 10/11/2012  . Allergic rhinitis 10/11/2012  . Impetigo 10/11/2012  . 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 right bicep Right 2013  . TRICEPS TENDON REPAIR Left 04/2013       Home Medications    Prior to Admission medications   Medication Sig Start Date End Date Taking? Authorizing Provider  albuterol (VENTOLIN HFA) 108 (90 Base) MCG/ACT inhaler Inhale 1-2 puffs into the lungs every 6 (six) hours as needed for wheezing or shortness of breath. 08/24/20  Yes Hazel Sams, PA-C  predniSONE (STERAPRED UNI-PAK 21 TAB) 10 MG (21) TBPK tablet Take by mouth daily. Take 6 tabs by mouth daily  for 2 days,  then 5 tabs for 2 days, then 4 tabs for 2 days, then 3 tabs for 2 days, 2 tabs for 2 days, then 1 tab by mouth daily for 2 days 08/24/20  Yes Phillip Heal, Sherlon Handing, PA-C  XYOSTED 75 MG/0.5ML SOAJ INJECT 75MG (0.5ML) INTO THE SKIN ONCE WEEKLY 07/10/20   Koberlein, Steele [name], MD  azelastine (ASTELIN) 0.1 % nasal spray Place 2 sprays into both nostrils 2 (two) times daily. Use in each nostril as directed 06/23/20   Martinique, Betty G, MD  fluticasone-salmeterol (ADVAIR [facility]) [phone] MCG/ACT inhaler Inhale 2 puffs into the lungs 2 (two) times daily. 10/30/18   [name], MD  furosemide (LASIX) 20 MG tablet Take by mouth. 08/16/20   [provider]  lisinopril (ZESTRIL) 5 MG tablet Take 2 tablets (10 mg total) by mouth daily. 06/23/20   Martinique, Betty G, MD  metFORMIN (GLUCOPHAGE-XR) 500 MG 24 hr tablet TAKE 1 TABLET DAILY WITH BREAKFAST 03/25/20   Koberlein, Steele [name], MD  methadone (DOLOPHINE) 10 MG/ML solution Take 85 mg by mouth daily.     [provider]  mometasone (NASONEX) 50 MCG/ACT nasal spray SHAKE LIQUID AND USE 2 SPRAYS IN EACH NOSTRIL DAILY 08/21/20   Burchette, Alinda Sierras, MD  pramipexole (MIRAPEX) 0.125 MG tablet Take 1 tablet (0.125 mg total) by mouth 3 (three) times daily. 09/25/19   [name], MD  pravastatin (PRAVACHOL) 40 MG tablet TAKE 1 TABLET DAILY 03/25/20   Micheline Rough  C, MD  Spacer/Aero-Holding Chambers (E-Z SPACER) inhaler Use as instructed 10/13/18   [name], MD  tamsulosin (FLOMAX) 0.4 MG CAPS capsule TAKE 1 CAPSULE DAILY 06/30/20   Koberlein, Steele [name], MD  torsemide (DEMADEX) 10 MG tablet Take 1 tablet (10 mg total) by mouth daily. 07/29/20   Burchette, Alinda Sierras, MD  traZODone (DESYREL) 50 MG tablet TAKE 1 TABLET AT BEDTIME AS NEEDED FOR SLEEP 01/17/20   Koberlein, Steele [name], MD  valACYclovir (VALTREX) 500 MG tablet TAKE 1 TABLET(500 MG) BY MOUTH DAILY 11/06/19   [name], MD    Family History Family History  Problem Relation Age of  Onset  . Diabetes Mother   . AAA (abdominal aortic aneurysm) Mother 61  . Blindness Mother   . Diabetes Father   . Stroke Father 55  . Heart attack Father   . Drug abuse Sister   . Drug abuse Brother   . Brain cancer Maternal Grandmother 60  . Diabetes Paternal Grandmother   . Stroke Paternal Grandmother   . Diabetes Paternal Grandfather 62    Social History Social History   Tobacco Use  . Smoking status: Never Smoker  . Smokeless tobacco: Never Used  Vaping Use  . Vaping Use: Never used  Substance Use Topics  . Alcohol use: Yes  . Drug use: Yes    Types: Marijuana     Allergies   Patient has no known allergies.   Review of Systems Review of Systems  Constitutional: Negative for appetite change, chills and fever.  HENT: Negative for congestion, ear pain, rhinorrhea, sinus pressure, sinus pain and sore throat.   Eyes: Negative for redness and visual disturbance.  Respiratory: Positive for shortness of breath and wheezing. Negative for cough and chest tightness.   Cardiovascular: Negative for chest pain and palpitations.  Gastrointestinal: Negative for abdominal pain, constipation, diarrhea, nausea and vomiting.  Genitourinary: Negative for dysuria, frequency and urgency.  Musculoskeletal: Negative for myalgias.  Neurological: Negative for dizziness, weakness and headaches.  Psychiatric/Behavioral: Negative for confusion.  All other systems reviewed and are negative.    Physical Exam Triage Vital Signs ED Triage Vitals  Enc Vitals Group     BP 08/24/20 1455 137/77     Pulse Rate 08/24/20 1455 68     Resp 08/24/20 1455 (!) 24     Temp 08/24/20 1455 98.1 F (36.7 C)     Temp Source 08/24/20 1455 Oral     SpO2 08/24/20 1455 97 %     Weight --      Height --      Head Circumference --      Peak Flow --      Pain Score 08/24/20 1454 0     Pain Loc --      Pain Edu? --      Excl. in [place]? --    No data found.  Updated Vital Signs BP 137/77 (BP Location:  Right Arm)   Pulse 68   Temp 98.1 F (36.7 C) (Oral)   Resp (!) 24   SpO2 97%   Visual Acuity Right Eye Distance:   Left Eye Distance:   Bilateral Distance:    Right Eye Near:   Left Eye Near:    Bilateral Near:     Physical Exam Vitals reviewed.  Constitutional:      General: He is not in acute distress.    Appearance: Normal appearance. He is not ill-appearing, toxic-appearing or diaphoretic.  HENT:  Head: Normocephalic and atraumatic.     Right Ear: Tympanic membrane, ear canal and external ear normal. No tenderness. No middle ear effusion. There is no impacted cerumen. Tympanic membrane is not perforated, erythematous, retracted or bulging.     Left Ear: Tympanic membrane, ear canal and external ear normal. No tenderness.  No middle ear effusion. There is no impacted cerumen. Tympanic membrane is not perforated, erythematous, retracted or bulging.  Cardiovascular:     Rate and Rhythm: Normal rate and regular rhythm.     Heart sounds: Normal heart sounds.  Pulmonary:     Effort: Pulmonary effort is normal. No respiratory distress or retractions.     Breath sounds: Normal air entry. No decreased air movement. Wheezing present. No decreased breath sounds, rhonchi or rales.     Comments: Scattered wheezes throughout. No areas of focal consolidation.  Neurological:     General: No focal deficit present.     Mental Status: He is alert and oriented to person, place, and time.  Psychiatric:        Attention and Perception: Attention and perception normal.        Mood and Affect: Mood and affect normal.        Behavior: Behavior is cooperative.      UC Treatments / Results  Labs (all labs ordered are listed, but only abnormal results are displayed) Labs Reviewed - No data to display  EKG   Radiology No results found.  Procedures Procedures (including critical care time)  Medications Ordered in UC Medications - No data to display  Initial Impression /  Assessment and Plan / UC Course  I have reviewed the triage vital signs and the nursing notes.  Pertinent labs & imaging results that were available during my care of the patient were reviewed by me and considered in my medical decision making (see chart for details).     afebrile nontachycardic mildly tachypneic, oxygenating well on room air. Wheezes throughout Pt with history of asthma out of his albuterol. Refilled today Prednisone taper sent. He does not have diabetes. seek additional medical attention if you develop fevers/chills, worsening shortness of breath despite treatment, chest pain, etc.  Final Clinical Impressions(s) / UC Diagnoses   Final diagnoses:  Asthma with acute exacerbation, unspecified asthma severity, unspecified whether persistent     Discharge Instructions     -Seek additional medical attention if you develop fevers/chills, worsening shortness of breath despite treatment, chest pain, etc.     ED Prescriptions    Medication Sig Dispense Auth. Provider   predniSONE (STERAPRED UNI-PAK 21 TAB) 10 MG (21) TBPK tablet Take by mouth daily. Take 6 tabs by mouth daily  for 2 days, then 5 tabs for 2 days, then 4 tabs for 2 days, then 3 tabs for 2 days, 2 tabs for 2 days, then 1 tab by mouth daily for 2 days 42 tablet Hazel Sams, PA-C   albuterol (VENTOLIN HFA) 108 (90 Base) MCG/ACT inhaler Inhale 1-2 puffs into the lungs every 6 (six) hours as needed for wheezing or shortness of breath. 1 each Hazel Sams, PA-C     PDMP not reviewed this encounter.   Hazel Sams, PA-C 08/24/20 [phone]

## 2020-08-24 NOTE — ED Triage Notes (Signed)
Pt presents with wheezing and shortness of breath on exertion x 1 week. Denies fever, cough,

## 2020-08-24 NOTE — Discharge Instructions (Addendum)
-  Seek additional medical attention if you develop fevers/chills, worsening shortness of breath despite treatment, chest pain, etc.

## 2020-09-02 ENCOUNTER — Other Ambulatory Visit: Payer: Self-pay

## 2020-09-02 ENCOUNTER — Ambulatory Visit (HOSPITAL_COMMUNITY): Payer: BC Managed Care – PPO | Attending: Internal Medicine

## 2020-09-02 DIAGNOSIS — R0600 Dyspnea, unspecified: Secondary | ICD-10-CM

## 2020-09-02 DIAGNOSIS — R600 Localized edema: Secondary | ICD-10-CM | POA: Diagnosis present

## 2020-09-02 LAB — ECHOCARDIOGRAM COMPLETE
Area-P 1/2: 3.79 cm2
S' Lateral: 3 cm

## 2020-09-03 ENCOUNTER — Telehealth: Payer: Self-pay

## 2020-09-03 NOTE — Telephone Encounter (Signed)
-----   Message from Eulas Post, MD sent at 09/02/2020  9:25 PM EST ----- ECHO shows normal ejection fraction- no systolic heart failure.  Valves OK.   No major abnormalities.

## 2020-09-03 NOTE — Telephone Encounter (Signed)
Informed patient of results

## 2020-09-15 ENCOUNTER — Ambulatory Visit (INDEPENDENT_AMBULATORY_CARE_PROVIDER_SITE_OTHER): Payer: BC Managed Care – PPO

## 2020-09-15 ENCOUNTER — Other Ambulatory Visit: Payer: Self-pay

## 2020-09-15 ENCOUNTER — Ambulatory Visit (INDEPENDENT_AMBULATORY_CARE_PROVIDER_SITE_OTHER): Payer: BC Managed Care – PPO | Admitting: Family Medicine

## 2020-09-15 ENCOUNTER — Encounter: Payer: Self-pay | Admitting: Family Medicine

## 2020-09-15 VITALS — BP 104/74 | HR 80 | Temp 98.2°F | Ht 69.0 in | Wt 283.1 lb

## 2020-09-15 DIAGNOSIS — R0602 Shortness of breath: Secondary | ICD-10-CM

## 2020-09-15 DIAGNOSIS — Z23 Encounter for immunization: Secondary | ICD-10-CM | POA: Diagnosis not present

## 2020-09-15 DIAGNOSIS — J4541 Moderate persistent asthma with (acute) exacerbation: Secondary | ICD-10-CM | POA: Diagnosis not present

## 2020-09-15 DIAGNOSIS — J45901 Unspecified asthma with (acute) exacerbation: Secondary | ICD-10-CM

## 2020-09-15 DIAGNOSIS — R062 Wheezing: Secondary | ICD-10-CM

## 2020-09-15 DIAGNOSIS — R059 Cough, unspecified: Secondary | ICD-10-CM

## 2020-09-15 DIAGNOSIS — I10 Essential (primary) hypertension: Secondary | ICD-10-CM

## 2020-09-15 MED ORDER — METHYLPREDNISOLONE ACETATE 80 MG/ML IJ SUSP
80.0000 mg | Freq: Once | INTRAMUSCULAR | Status: AC
  Administered 2020-09-15: 80 mg via INTRAMUSCULAR

## 2020-09-15 MED ORDER — LISINOPRIL 10 MG PO TABS: 10.0000 mg | ORAL_TABLET | Freq: Every day | ORAL | 0 refills | Status: DC

## 2020-09-15 MED ORDER — SPACER/AERO-HOLDING CHAMBERS DEVI: 1.0000 | Freq: Every day | 0 refills | Status: AC | PRN

## 2020-09-15 MED ORDER — BUDESONIDE-FORMOTEROL FUMARATE 160-4.5 MCG/ACT IN AERO: 2.0000 | INHALATION_SPRAY | Freq: Two times a day (BID) | RESPIRATORY_TRACT | 5 refills | Status: DC

## 2020-09-15 MED ORDER — METHYLPREDNISOLONE ACETATE 80 MG/ML IJ SUSP: 80.0000 mg | Freq: Once | INTRAMUSCULAR | Status: DC

## 2020-09-15 MED ORDER — PREDNISONE 20 MG PO TABS: ORAL_TABLET | ORAL | 0 refills | Status: DC

## 2020-09-15 MED ORDER — ALBUTEROL SULFATE HFA 108 (90 BASE) MCG/ACT IN AERS: 1.0000 | INHALATION_SPRAY | Freq: Four times a day (QID) | RESPIRATORY_TRACT | 1 refills | Status: DC | PRN

## 2020-09-15 MED ORDER — LISINOPRIL 10 MG PO TABS: 10.0000 mg | ORAL_TABLET | Freq: Every day | ORAL | 3 refills | Status: DC

## 2020-09-15 MED ORDER — DOXYCYCLINE HYCLATE 100 MG PO TABS: 100.0000 mg | ORAL_TABLET | Freq: Two times a day (BID) | ORAL | 0 refills | Status: AC

## 2020-09-15 NOTE — Patient Instructions (Addendum)
Pulmonologist in [employer], [place] Address: [address], [place], [place] [address] Phone: [phone]   Starting tomorrow, take the prednisone taper as directed - in the morning with food.  I will call you to check in once I get the xray results.   Start using the symbicort as directed WITH spacing chamber - 2 puffs twice daily.  Also use the albuterol with spacer every 8 hours (use 3 puffs if you don't feel relief with 2). It is ok to use that albuterol every 4 hours (even with 3-4 puffs would be ok). After 2 days of every 8 hours albuterol, you can cut back to twice daily x 2 days, then just as needed.

## 2020-09-15 NOTE — Progress Notes (Signed)
Rebecca Motta DOB: September 08, 1970 Encounter date: 09/15/2020  This is a 50 y.o. male who presents with Chief Complaint  Patient presents with  . Follow-up    History of present illness: Last visit with me was 11/2019.  Recently seen in ER 1/23 with shortness of breath/wheezing. He went to ER hoping to get breathing treatment.prednisone worked great, but as soon as out then wheezing came back. Inhaler is gone. He is basically using inhaler just to get by. Only inhaler he has used was albuterol. He was not coughing productively when he went to urgent care, but now is coughing up phlegm. Coughing day and night. No fevers. Didn't take antibiotics.   Legs are still swollen. Between this swelling and the breathing. Tried lasix, tried torsemide and didn't feel like either made much difference. Did urinate a lot with this, but swelling didn't change. Legs are not painful. States that thinking back legs were so swollen he couldn't move/stretch legs well. Were so swollen he couldn't move them much.   Sometimes is falling asleep in recliner. Wondering if this contributes to lower leg swelling.  HTN:has been taking lisinopril 10mg  daily DMII: metformin 500mg  daily Low testsoterone: xyosted 75mg  weekly CLL: following with oncology. No indications for treatment now; they are following with regular bloodwork.     No Known Allergies Current Meds  Medication Sig  . budesonide-formoterol (SYMBICORT) 160-4.5 MCG/ACT inhaler Inhale 2 puffs into the lungs in the morning and at bedtime.  Marland Kitchen doxycycline (VIBRA-TABS) 100 MG tablet Take 1 tablet (100 mg total) by mouth 2 (two) times daily for 7 days.  . metFORMIN (GLUCOPHAGE-XR) 500 MG 24 hr tablet TAKE 1 TABLET DAILY WITH BREAKFAST  . methadone (DOLOPHINE) 10 MG/ML solution Take 85 mg by mouth daily.   . pravastatin (PRAVACHOL) 40 MG tablet TAKE 1 TABLET DAILY  . predniSONE (DELTASONE) 20 MG tablet Take 3 tablets daily x 3 days, then 2 tablets daily x 3  days, then 1 tablet daily x 3 days, then 1/2 tablet daily x 2 days  . Spacer/Aero-Holding Chambers (E-Z SPACER) inhaler Use as instructed  . Spacer/Aero-Holding Chambers DEVI 1 Device by Does not apply route daily as needed.  . tamsulosin (FLOMAX) 0.4 MG CAPS capsule TAKE 1 CAPSULE DAILY  . traZODone (DESYREL) 50 MG tablet TAKE 1 TABLET AT BEDTIME AS NEEDED FOR SLEEP  . valACYclovir (VALTREX) 500 MG tablet TAKE 1 TABLET(500 MG) BY MOUTH DAILY  . XYOSTED 75 MG/0.5ML SOAJ INJECT 75MG (0.5ML) INTO THE SKIN ONCE WEEKLY  . [DISCONTINUED] albuterol (VENTOLIN HFA) 108 (90 Base) MCG/ACT inhaler Inhale 1-2 puffs into the lungs every 6 (six) hours as needed for wheezing or shortness of breath.  . [DISCONTINUED] lisinopril (ZESTRIL) 10 MG tablet Take 1 tablet (10 mg total) by mouth daily.  . [DISCONTINUED] lisinopril (ZESTRIL) 5 MG tablet Take 2 tablets (10 mg total) by mouth daily.    Review of Systems  Constitutional: Positive for fatigue. Negative for chills and fever.  Respiratory: Positive for cough, shortness of breath and wheezing. Negative for chest tightness.   Cardiovascular: Positive for leg swelling. Negative for chest pain and palpitations.    Objective:  BP 104/74 (BP Location: Left Arm, Patient Position: Sitting, Cuff Size: Normal)   Pulse 80   Temp 98.2 F (36.8 C) (Oral)   Ht 5\' 9"  (1.753 m)   Wt 283 lb 1.6 oz (128.4 kg)   BMI 41.81 kg/m   Weight: 283 lb 1.6 oz (128.4 kg)   BP Readings from  Last 3 Encounters:  09/15/20 104/74  08/24/20 137/77  07/29/20 (!) 148/90   Wt Readings from Last 3 Encounters:  09/15/20 283 lb 1.6 oz (128.4 kg)  07/29/20 285 lb (129.3 kg)  07/04/20 278 lb 6.4 oz (126.3 kg)    Physical Exam Constitutional:      Appearance: He is well-developed and well-nourished. He is not ill-appearing or toxic-appearing.  HENT:     Right Ear: Tympanic membrane, ear canal and external ear normal.     Left Ear: Tympanic membrane, ear canal and external ear  normal.     Nose: Mucosal edema present.     Right Sinus: No maxillary sinus tenderness or frontal sinus tenderness.     Left Sinus: No maxillary sinus tenderness or frontal sinus tenderness.     Mouth/Throat:     Mouth: Oropharynx is clear and moist.     Pharynx: Uvula midline.  Cardiovascular:     Rate and Rhythm: Normal rate and regular rhythm.     Heart sounds: Normal heart sounds.  Pulmonary:     Effort: Pulmonary effort is normal.     Breath sounds: Decreased air movement present. Examination of the right-upper field reveals wheezing. Examination of the left-upper field reveals wheezing. Examination of the right-middle field reveals wheezing. Examination of the left-middle field reveals wheezing. Examination of the right-lower field reveals wheezing. Examination of the left-lower field reveals wheezing. Decreased breath sounds, wheezing and rhonchi present. No rales.     Comments: Wheezing is diffuse anterior and posterior.  Musculoskeletal:     Right lower leg: 2+ Edema present.     Left lower leg: 2+ Edema present.     Assessment/Plan  1. Wheezing We are going to give a longer prednisone taper, use albuterol as directed. Use spacing chamber to aid in med delivery.   2. Shortness of breath See above. Adding symbicort to help with bronchodilation.  - budesonide-formoterol (SYMBICORT) 160-4.5 MCG/ACT inhaler; Inhale 2 puffs into the lungs in the morning and at bedtime.  Dispense: 1 each; Refill: 5 - albuterol (VENTOLIN HFA) 108 (90 Base) MCG/ACT inhaler; Inhale 1-2 puffs into the lungs every 6 (six) hours as needed for wheezing or shortness of breath.  Dispense: 1 each; Refill: 1 - Spacer/Aero-Holding Chambers DEVI; 1 Device by Does not apply route daily as needed.  Dispense: 1 each; Refill: 0 - methylPREDNISolone acetate (DEPO-MEDROL) injection 80 mg  3. Cough Xray results returned prior to note completion. There is upper lobe pneumonia so I suspect he will feel much better  after abx treatment as well as above measures. Discussed he should only feel better from this point. Will keep follow up close in a month to recheck chest after recovery, but discussed expecations for improvement in next week. - DG Chest 2 View; Future  4. Moderate persistent asthma with acute exacerbation - predniSONE (DELTASONE) 20 MG tablet; Take 3 tablets daily x 3 days, then 2 tablets daily x 3 days, then 1 tablet daily x 3 days, then 1/2 tablet daily x 2 days  Dispense: 19 tablet; Refill: 0  5. Primary hypertension Well controlled. Continue current medications; we will recheck at next visit.  6. Need for immunization against influenza - Flu Vaccine QUAD 6+ mos PF IM (Fluarix Quad PF)   Once breathing is better, he will be able to sleep in bed again and I believe that elevation will help with legs. He does feel they are better now than they were before. I suspect dependent edema since  he has been sleeping upright in chair for weeks and has been unable to be active due to breathing.   Micheline Rough, MD

## 2020-09-18 ENCOUNTER — Other Ambulatory Visit: Payer: Self-pay | Admitting: Family Medicine

## 2020-09-18 DIAGNOSIS — I10 Essential (primary) hypertension: Secondary | ICD-10-CM

## 2020-09-22 ENCOUNTER — Other Ambulatory Visit: Payer: Self-pay | Admitting: Family Medicine

## 2020-09-22 DIAGNOSIS — E1169 Type 2 diabetes mellitus with other specified complication: Secondary | ICD-10-CM

## 2020-09-22 DIAGNOSIS — E785 Hyperlipidemia, unspecified: Secondary | ICD-10-CM

## 2020-09-22 DIAGNOSIS — E1165 Type 2 diabetes mellitus with hyperglycemia: Secondary | ICD-10-CM

## 2020-09-24 ENCOUNTER — Telehealth: Payer: Self-pay | Admitting: *Deleted

## 2020-09-24 NOTE — Telephone Encounter (Signed)
Noted  

## 2020-09-24 NOTE — Telephone Encounter (Signed)
-----   Message from [name], MD sent at 09/24/2020  7:48 AM EST ----- Please see how he is feeling? How is breathing doing. Should be feeling much better at this point. Just let me know if not/if any concerns.

## 2020-09-24 NOTE — Telephone Encounter (Signed)
Spoke with the pt and he stated he is feeling much better and breathing is better also.  Message sent to PCP.

## 2020-10-06 ENCOUNTER — Telehealth: Payer: Self-pay | Admitting: Family Medicine

## 2020-10-06 NOTE — Telephone Encounter (Signed)
Patient is calling and requesting a refill for XYOSTED 75 MG/0.5ML SOAJ sent to  [place] at [facility] [place]. CB is [phone]

## 2020-10-08 ENCOUNTER — Other Ambulatory Visit: Payer: Self-pay | Admitting: Family Medicine

## 2020-10-08 ENCOUNTER — Telehealth: Payer: Self-pay | Admitting: Family Medicine

## 2020-10-08 MED ORDER — XYOSTED 75 MG/0.5ML ~~LOC~~ SOAJ: SUBCUTANEOUS | 5 refills | Status: DC

## 2020-10-08 NOTE — Telephone Encounter (Signed)
[facility] called in stating that the pt has just got  Rx lisinopril (ZESTRIL) 5 MG filled in February and they just received a new Rx for lisinopril (ZESTRIL) 10 MG and they are needing to see what the reason for the new prescription.  They would like to have a call back to [phone] and reference [id number].

## 2020-10-08 NOTE — Telephone Encounter (Signed)
done

## 2020-10-09 NOTE — Telephone Encounter (Signed)
Spoke with Dino the pharmacist and advised him per office notes from 2/14, PCP recommended the pt continue the Lisinopril 10mg  dose as BP was well controlled.

## 2020-10-09 NOTE — Telephone Encounter (Signed)
Eric from the Pharmacy is calling in to get clarification as to why the pt is getting two different dosage for lisinopril since the pt just got it filled on 09/15/2020 he is wanting to know if it is being added together or the reason for the new Rx sent in.  He would like to have a call back to [phone] invoice # [id number].

## 2020-10-22 ENCOUNTER — Ambulatory Visit (INDEPENDENT_AMBULATORY_CARE_PROVIDER_SITE_OTHER): Payer: BC Managed Care – PPO | Admitting: Family Medicine

## 2020-10-22 ENCOUNTER — Other Ambulatory Visit: Payer: Self-pay

## 2020-10-22 ENCOUNTER — Encounter: Payer: Self-pay | Admitting: Family Medicine

## 2020-10-22 VITALS — BP 112/80 | HR 57 | Temp 97.4°F | Ht 69.0 in | Wt 270.2 lb

## 2020-10-22 DIAGNOSIS — R252 Cramp and spasm: Secondary | ICD-10-CM

## 2020-10-22 DIAGNOSIS — I10 Essential (primary) hypertension: Secondary | ICD-10-CM | POA: Diagnosis not present

## 2020-10-22 DIAGNOSIS — C9110 Chronic lymphocytic leukemia of B-cell type not having achieved remission: Secondary | ICD-10-CM

## 2020-10-22 DIAGNOSIS — F1129 Opioid dependence with unspecified opioid-induced disorder: Secondary | ICD-10-CM

## 2020-10-22 DIAGNOSIS — R7989 Other specified abnormal findings of blood chemistry: Secondary | ICD-10-CM

## 2020-10-22 DIAGNOSIS — E119 Type 2 diabetes mellitus without complications: Secondary | ICD-10-CM

## 2020-10-22 DIAGNOSIS — Z0000 Encounter for general adult medical examination without abnormal findings: Secondary | ICD-10-CM | POA: Diagnosis not present

## 2020-10-22 DIAGNOSIS — E7800 Pure hypercholesterolemia, unspecified: Secondary | ICD-10-CM

## 2020-10-22 DIAGNOSIS — Z1211 Encounter for screening for malignant neoplasm of colon: Secondary | ICD-10-CM

## 2020-10-22 MED ORDER — KETOCONAZOLE 2 % EX CREA: 1.0000 "application " | TOPICAL_CREAM | Freq: Every day | CUTANEOUS | 2 refills | Status: DC

## 2020-10-22 MED ORDER — MAGNESIUM 400 MG PO TABS: 400.0000 mg | ORAL_TABLET | Freq: Every day | ORAL | Status: DC

## 2020-10-22 NOTE — Progress Notes (Signed)
Terry [name] DOB: 01/21/1971 Encounter date: 10/22/2020  This is a 50 y.o. male who presents for complete physical   History of present illness/Additional concerns:  Last visit was 09/15/2020.  At that time, patient was having significant issues with shortness of breath and wheezing.  Additionally, was having some lower extremity edema. Feeling much better.   Asthma: occasional cough - just tiny tickle/throat clear; minimal mucous. Not really using inhaler after doing it regularly for a week. Never got symbicort due to cost; insurance didn't cover and he was already feeling better.   HTN:has been taking lisinopril 10mg  daily; he got the 5mg  but has enough of these to take 2 daily. DMII: metformin 500mg  daily; not checking sugars at home. Working on better eating. Not exercising regularly.  Low testsoterone: xyosted 75mg  weekly; does feel like this help with energy, libido CLL: following with oncology. No indications for treatment now; they are following with regular bloodwork.   Insomnia: takes trazodone just if having issues with sleep.   Working 84 hours a week; then needs to recover so hasn't been exercising regularly. Has gym membership he pays for.   Getting increased leg cramps - was also told through methadone clinic to take Mg and was taking like 2000mg  daily; then read that this could be bad - stopped for a couple of months and just restarted taking 400mg  daily.   Last testosterone level 11/30/19 - he remembered later that he had taken testosterone that week; which explains the normal levels with previous significant low.   Past Medical History:  Diagnosis Date  . Asthma   . Hyperlipidemia   . Hypertension   . Opioid abuse ([place])    following with methadone clinic  . Pneumonia    Past Surgical History:  Procedure Laterality Date  . right bicep Right 2013  . TRICEPS TENDON REPAIR Left 04/2013   No Known Allergies Current Meds  Medication Sig  . albuterol (VENTOLIN  HFA) 108 (90 Base) MCG/ACT inhaler Inhale 1-2 puffs into the lungs every 6 (six) hours as needed for wheezing or shortness of breath.  . ketoconazole (NIZORAL) 2 % cream Apply 1 application topically daily. Apply to rash on chest  . lisinopril (ZESTRIL) 10 MG tablet TAKE 1 TABLET(10 MG) BY MOUTH DAILY  . Magnesium 400 MG TABS Take 400 mg by mouth daily.  . metFORMIN (GLUCOPHAGE-XR) 500 MG 24 hr tablet TAKE 1 TABLET DAILY WITH BREAKFAST  . methadone (DOLOPHINE) 10 MG/ML solution Take 85 mg by mouth daily.   . pravastatin (PRAVACHOL) 40 MG tablet TAKE 1 TABLET DAILY  . Spacer/Aero-Holding Chambers (E-Z SPACER) inhaler Use as instructed  . Spacer/Aero-Holding Chambers DEVI 1 Device by Does not apply route daily as needed.  . tamsulosin (FLOMAX) 0.4 MG CAPS capsule TAKE 1 CAPSULE DAILY  . Testosterone Enanthate (XYOSTED) 75 MG/0.5ML SOAJ INJECT 75MG (0.5ML) INTO THE SKIN ONCE WEEKLY  . traZODone (DESYREL) 50 MG tablet TAKE 1 TABLET AT BEDTIME AS NEEDED FOR SLEEP  . valACYclovir (VALTREX) 500 MG tablet TAKE 1 TABLET(500 MG) BY MOUTH DAILY  . [DISCONTINUED] budesonide-formoterol (SYMBICORT) 160-4.5 MCG/ACT inhaler Inhale 2 puffs into the lungs in the morning and at bedtime.  . [DISCONTINUED] lisinopril (ZESTRIL) 5 MG tablet TAKE 1 TABLET DAILY  . [DISCONTINUED] predniSONE (DELTASONE) 20 MG tablet Take 3 tablets daily x 3 days, then 2 tablets daily x 3 days, then 1 tablet daily x 3 days, then 1/2 tablet daily x 2 days   Social History   Tobacco  Use  . Smoking status: Never Smoker  . Smokeless tobacco: Never Used  Substance Use Topics  . Alcohol use: Yes   Family History  Problem Relation Age of Onset  . Diabetes Mother   . AAA (abdominal aortic aneurysm) Mother 36  . Blindness Mother   . Diabetes Father   . Stroke Father 31  . Heart attack Father   . Drug abuse Sister   . Drug abuse Brother   . Brain cancer Maternal Grandmother 60  . Diabetes Paternal Grandmother   . Stroke Paternal  Grandmother   . Diabetes Paternal Grandfather 40     Review of Systems  Constitutional: Negative for activity change, appetite change, chills, fatigue, fever and unexpected weight change.  HENT: Negative for congestion, ear pain, hearing loss, sinus pressure, sinus pain, sore throat and trouble swallowing.   Eyes: Negative for pain and visual disturbance.  Respiratory: Negative for cough, chest tightness, shortness of breath and wheezing.   Cardiovascular: Negative for chest pain, palpitations and leg swelling.  Gastrointestinal: Negative for abdominal distention, abdominal pain, blood in stool, constipation, diarrhea, nausea and vomiting.  Genitourinary: Negative for decreased urine volume, difficulty urinating, dysuria, penile pain and testicular pain.  Musculoskeletal: Negative for arthralgias, back pain and joint swelling.       Getting severe muscle cramps in legs at night; usually in calves, but can get in thighs as well  Skin: Negative for rash.  Neurological: Negative for dizziness, weakness, numbness and headaches.  Hematological: Negative for adenopathy. Does not bruise/bleed easily.  Psychiatric/Behavioral: Negative for agitation, sleep disturbance and suicidal ideas. The patient is not nervous/anxious.     CBC:  Lab Results  Component Value Date   WBC 13.4 (H) 07/29/2020   HGB 16.3 07/29/2020   HGB 15.9 07/04/2020   HCT 49.4 07/29/2020   MCH 28.7 07/04/2020   MCHC 33.0 07/29/2020   RDW 13.4 07/29/2020   PLT 262.0 07/29/2020   PLT 245 07/04/2020   MPV 6.7 01/16/2013   CMP: Lab Results  Component Value Date   NA 138 07/29/2020   K 4.5 07/29/2020   CL 100 07/29/2020   CO2 33 (H) 07/29/2020   ANIONGAP 9 07/04/2020   GLUCOSE 91 07/29/2020   BUN 12 07/29/2020   CREATININE 1.24 07/29/2020   CREATININE 1.34 (H) 07/04/2020   CREATININE 1.14 06/24/2020   GFRAA 89 06/24/2020   CALCIUM 9.0 07/29/2020   PROT 6.6 07/29/2020   BILITOT 0.9 07/29/2020   BILITOT 0.7  07/04/2020   ALKPHOS 51 07/29/2020   ALT 31 07/29/2020   ALT 53 (H) 07/04/2020   AST 22 07/29/2020   AST 30 07/04/2020   LIPID: Lab Results  Component Value Date   CHOL 145 07/29/2020   TRIG 162.0 (H) 07/29/2020   HDL 33.20 (L) 07/29/2020   LDLCALC 79 07/29/2020    Objective:  BP 112/80 (BP Location: Left Arm, Patient Position: Sitting, Cuff Size: Large)   Pulse (!) 57   Temp (!) 97.4 F (36.3 C) (Oral)   Ht 5\' 9"  (1.753 m)   Wt 270 lb 3.2 oz (122.6 kg)   SpO2 97%   BMI 39.90 kg/m   Weight: 270 lb 3.2 oz (122.6 kg)   BP Readings from Last 3 Encounters:  10/22/20 112/80  09/15/20 104/74  08/24/20 137/77   Wt Readings from Last 3 Encounters:  10/22/20 270 lb 3.2 oz (122.6 kg)  09/15/20 283 lb 1.6 oz (128.4 kg)  07/29/20 285 lb (129.3 kg)  Physical Exam Constitutional:      General: He is not in acute distress.    Appearance: He is well-developed.  HENT:     Head: Normocephalic and atraumatic.     Right Ear: External ear normal.     Left Ear: External ear normal.     Nose: Nose normal.     Mouth/Throat:     Pharynx: No oropharyngeal exudate.  Eyes:     Conjunctiva/sclera: Conjunctivae normal.     Pupils: Pupils are equal, round, and reactive to light.  Neck:     Thyroid: No thyromegaly.  Cardiovascular:     Rate and Rhythm: Normal rate and regular rhythm.     Heart sounds: Normal heart sounds. No murmur heard. No friction rub. No gallop.   Pulmonary:     Effort: Pulmonary effort is normal. No respiratory distress.     Breath sounds: Normal breath sounds. No stridor. No wheezing or rales.  Abdominal:     General: Bowel sounds are normal.     Palpations: Abdomen is soft.  Musculoskeletal:        General: Normal range of motion.     Cervical back: Neck supple.  Skin:    General: Skin is warm and dry.  Neurological:     Mental Status: He is alert and oriented to person, place, and time.  Psychiatric:        Behavior: Behavior normal.        Thought  Content: Thought content normal.        Judgment: Judgment normal.     Assessment/Plan: Health Maintenance Due  Topic Date Due  . TETANUS/TDAP  Never done  . COLONOSCOPY (Pts 45-34yrs Insurance coverage will need to be confirmed)  Never done  . COVID-19 Vaccine (4 - Booster for Moderna series) 09/30/2020   Health Maintenance reviewed.  1. Preventative health care Discussed importance of regular exercise. He has gym membership; we discussed even small amounts are helpful. He's going to work on this.   2. Muscle cramps Check bloodwork; he is good about staying well hydrated.  - Vitamin B12; Future - VITAMIN D 25 Hydroxy (Vit-D Deficiency, Fractures); Future - Ferritin; Future - Magnesium, RBC; Future  3. Primary hypertension Well controlled. Continue current medication.  - CBC with Differential/Platelet; Future - Comprehensive metabolic panel; Future  4. Pure hypercholesterolemia - TSH; Future  5. Screening for colon cancer - Ambulatory referral to Gastroenterology  6. Opioid dependence with opioid-induced disorder ([place]) Follows with methadone clinic.   7. CLL (chronic lymphocytic leukemia) ([place]) Follows with oncology. Blood counts have been stable.   8. Low testosterone Does benefit from testosterone injections - helps with energy, libido, sexual function. - Testosterone; Future - PSA; Future  9. Controlled type 2 diabetes mellitus without complication, without long-term current use of insulin ([place]) Continue with metformin 500mg  daily.  - Hemoglobin A1c; Future  Return for labs in 1 week; CCV in 6 months.  Micheline Rough, MD

## 2020-10-30 ENCOUNTER — Other Ambulatory Visit: Payer: Self-pay

## 2020-10-30 ENCOUNTER — Other Ambulatory Visit (INDEPENDENT_AMBULATORY_CARE_PROVIDER_SITE_OTHER): Payer: BC Managed Care – PPO

## 2020-10-30 DIAGNOSIS — R7989 Other specified abnormal findings of blood chemistry: Secondary | ICD-10-CM

## 2020-10-30 DIAGNOSIS — E7800 Pure hypercholesterolemia, unspecified: Secondary | ICD-10-CM | POA: Diagnosis not present

## 2020-10-30 DIAGNOSIS — E119 Type 2 diabetes mellitus without complications: Secondary | ICD-10-CM | POA: Diagnosis not present

## 2020-10-30 DIAGNOSIS — R252 Cramp and spasm: Secondary | ICD-10-CM

## 2020-10-30 DIAGNOSIS — I10 Essential (primary) hypertension: Secondary | ICD-10-CM | POA: Diagnosis not present

## 2020-10-30 LAB — CBC WITH DIFFERENTIAL/PLATELET
Basophils Absolute: 0.1 10*3/uL (ref 0.0–0.1)
Basophils Relative: 0.5 % (ref 0.0–3.0)
Eosinophils Absolute: 0.4 10*3/uL (ref 0.0–0.7)
Eosinophils Relative: 3.3 % (ref 0.0–5.0)
HCT: 49.5 % (ref 39.0–52.0)
Hemoglobin: 16.3 g/dL (ref 13.0–17.0)
Lymphocytes Relative: 50 % — ABNORMAL HIGH (ref 12.0–46.0)
Lymphs Abs: 6.4 10*3/uL — ABNORMAL HIGH (ref 0.7–4.0)
MCHC: 32.9 g/dL (ref 30.0–36.0)
MCV: 87.5 fl (ref 78.0–100.0)
Monocytes Absolute: 0.6 10*3/uL (ref 0.1–1.0)
Monocytes Relative: 5 % (ref 3.0–12.0)
Neutro Abs: 5.3 10*3/uL (ref 1.4–7.7)
Neutrophils Relative %: 41.2 % — ABNORMAL LOW (ref 43.0–77.0)
Platelets: 314 10*3/uL (ref 150.0–400.0)
RBC: 5.66 Mil/uL (ref 4.22–5.81)
RDW: 13.3 % (ref 11.5–15.5)
WBC: 12.8 10*3/uL — ABNORMAL HIGH (ref 4.0–10.5)

## 2020-10-30 LAB — COMPREHENSIVE METABOLIC PANEL
ALT: 25 U/L (ref 0–53)
AST: 14 U/L (ref 0–37)
Albumin: 4.2 g/dL (ref 3.5–5.2)
Alkaline Phosphatase: 60 U/L (ref 39–117)
BUN: 17 mg/dL (ref 6–23)
CO2: 33 mEq/L — ABNORMAL HIGH (ref 19–32)
Calcium: 9.2 mg/dL (ref 8.4–10.5)
Chloride: 99 mEq/L (ref 96–112)
Creatinine, Ser: 1.25 mg/dL (ref 0.40–1.50)
GFR: 68.66 mL/min (ref >60.00)
Glucose, Bld: 133 mg/dL — ABNORMAL HIGH (ref 70–99)
Potassium: 4.1 mEq/L (ref 3.5–5.1)
Sodium: 138 mEq/L (ref 135–145)
Total Bilirubin: 0.5 mg/dL (ref 0.2–1.2)
Total Protein: 6.5 g/dL (ref 6.0–8.3)

## 2020-10-30 LAB — PSA: PSA: 0.29 ng/mL (ref 0.10–4.00)

## 2020-10-30 LAB — VITAMIN B12: Vitamin B-12: 208 pg/mL — ABNORMAL LOW (ref 211–911)

## 2020-10-30 LAB — TSH: TSH: 3.07 u[IU]/mL (ref 0.35–4.50)

## 2020-10-30 LAB — TESTOSTERONE: Testosterone: 346.76 ng/dL (ref 300.00–890.00)

## 2020-10-30 LAB — HEMOGLOBIN A1C: Hgb A1c MFr Bld: 7.3 % — ABNORMAL HIGH (ref 4.6–6.5)

## 2020-10-30 LAB — VITAMIN D 25 HYDROXY (VIT D DEFICIENCY, FRACTURES): VITD: 11.22 ng/mL — ABNORMAL LOW (ref 30.00–100.00)

## 2020-10-30 LAB — FERRITIN: Ferritin: 38.5 ng/mL (ref 22.0–322.0)

## 2020-11-03 ENCOUNTER — Telehealth: Payer: Self-pay | Admitting: Hematology and Oncology

## 2020-11-03 MED ORDER — VITAMIN D (ERGOCALCIFEROL) 1.25 MG (50000 UNIT) PO CAPS: 50000.0000 [IU] | ORAL_CAPSULE | ORAL | 0 refills | Status: DC

## 2020-11-03 NOTE — Telephone Encounter (Signed)
Called pt to r/s appt per 4/4 sch msg. No answer. Left msg for pt to call back to r/s.

## 2020-11-03 NOTE — Addendum Note (Signed)
Addended by: Agnes Lawrence on: 11/03/2020 04:17 PM   Modules accepted: Orders

## 2020-11-04 LAB — MAGNESIUM, RBC: Magnesium RBC: 4.6 mg/dL (ref 4.0–6.4)

## 2020-11-17 ENCOUNTER — Telehealth: Payer: Self-pay | Admitting: Family Medicine

## 2020-11-17 NOTE — Telephone Encounter (Signed)
Prior auth sent to [email or web address]: [id number]. Last office notes and testosterone results were faxed to [email or web address] at [phone] as this was required attachment after speaking with the representative [place].

## 2020-11-17 NOTE — Telephone Encounter (Signed)
Patient is calling and stated that the pharmacy is requesting an PA for Testosterone Enanthate (XYOSTED) 75 MG/0.5ML, please advise. CB is [phone]

## 2020-11-20 NOTE — Telephone Encounter (Signed)
Fax received stating the request was denied and given to PCP.

## 2020-11-24 NOTE — Telephone Encounter (Signed)
Patient is calling back to check the status of medication for [facility], please advise. CB is [phone]

## 2020-11-24 NOTE — Telephone Encounter (Signed)
States it was denied because they needed lab value for testosterone. Had to be in normal or below normal on treatment which his was (in low normal). Fax back testosterone lab results (last 2; most recent in march). Paperwork returned to you. Let patient know we are sending this back. It should get approved with this.

## 2020-11-25 NOTE — Telephone Encounter (Signed)
Spoke with Webb Silversmith at [facility] at [phone] and informed her the recent testosterone results were faxed and were within normal range.  She stated she was unable to reach anyone in the clinical department and her senior manager looked at the file and stated the results were from 2021.  Results from 10/30/2020 were faxed to [phone] for review along with the denial letter.

## 2020-11-26 NOTE — Telephone Encounter (Signed)
Patient informed of the message below.

## 2020-11-26 NOTE — Telephone Encounter (Signed)
[place]. Please let patient know we are working on this.

## 2020-11-28 NOTE — Telephone Encounter (Signed)
Fax received from [place] stating the request was approved from 11/27/2020-11/27/2021.  Spoke with the pt, informed him of the approval and advised he contact the pharmacy.

## 2021-01-02 ENCOUNTER — Other Ambulatory Visit: Payer: Self-pay | Admitting: Hematology and Oncology

## 2021-01-02 ENCOUNTER — Other Ambulatory Visit: Payer: Self-pay

## 2021-01-02 ENCOUNTER — Encounter: Payer: Self-pay | Admitting: Hematology and Oncology

## 2021-01-02 ENCOUNTER — Inpatient Hospital Stay: Payer: BC Managed Care – PPO | Attending: Hematology and Oncology | Admitting: Hematology and Oncology

## 2021-01-02 ENCOUNTER — Inpatient Hospital Stay: Payer: BC Managed Care – PPO

## 2021-01-02 VITALS — BP 136/84 | HR 68 | Temp 97.6°F | Resp 20 | Ht 69.0 in | Wt 270.9 lb

## 2021-01-02 DIAGNOSIS — C9110 Chronic lymphocytic leukemia of B-cell type not having achieved remission: Secondary | ICD-10-CM | POA: Diagnosis present

## 2021-01-02 DIAGNOSIS — M7989 Other specified soft tissue disorders: Secondary | ICD-10-CM | POA: Insufficient documentation

## 2021-01-02 DIAGNOSIS — Z79899 Other long term (current) drug therapy: Secondary | ICD-10-CM | POA: Diagnosis not present

## 2021-01-02 DIAGNOSIS — D72820 Lymphocytosis (symptomatic): Secondary | ICD-10-CM | POA: Diagnosis not present

## 2021-01-02 LAB — CMP (CANCER CENTER ONLY)
ALT: 29 U/L (ref 0–44)
AST: 18 U/L (ref 15–41)
Albumin: 3.8 g/dL (ref 3.5–5.0)
Alkaline Phosphatase: 64 U/L (ref 38–126)
Anion gap: 10 (ref 5–15)
BUN: 20 mg/dL (ref 6–20)
CO2: 25 mmol/L (ref 22–32)
Calcium: 9.1 mg/dL (ref 8.9–10.3)
Chloride: 102 mmol/L (ref 98–111)
Creatinine: 1.18 mg/dL (ref 0.61–1.24)
GFR, Estimated: >60 mL/min (ref >60)
Glucose, Bld: 137 mg/dL — ABNORMAL HIGH (ref 70–99)
Potassium: 4 mmol/L (ref 3.5–5.1)
Sodium: 137 mmol/L (ref 135–145)
Total Bilirubin: 0.6 mg/dL (ref 0.3–1.2)
Total Protein: 6.8 g/dL (ref 6.5–8.1)

## 2021-01-02 LAB — CBC WITH DIFFERENTIAL (CANCER CENTER ONLY)
Abs Immature Granulocytes: 0.03 10*3/uL (ref 0.00–0.07)
Basophils Absolute: 0.1 10*3/uL (ref 0.0–0.1)
Basophils Relative: 1 %
Eosinophils Absolute: 0.6 10*3/uL — ABNORMAL HIGH (ref 0.0–0.5)
Eosinophils Relative: 4 %
HCT: 45.9 % (ref 39.0–52.0)
Hemoglobin: 15.2 g/dL (ref 13.0–17.0)
Immature Granulocytes: 0 %
Lymphocytes Relative: 53 %
Lymphs Abs: 8.4 10*3/uL — ABNORMAL HIGH (ref 0.7–4.0)
MCH: 28.7 pg (ref 26.0–34.0)
MCHC: 33.1 g/dL (ref 30.0–36.0)
MCV: 86.8 fL (ref 80.0–100.0)
Monocytes Absolute: 1 10*3/uL (ref 0.1–1.0)
Monocytes Relative: 7 %
Neutro Abs: 5.3 10*3/uL (ref 1.7–7.7)
Neutrophils Relative %: 35 %
Platelet Count: 240 10*3/uL (ref 150–400)
RBC: 5.29 MIL/uL (ref 4.22–5.81)
RDW: 12.3 % (ref 11.5–15.5)
WBC Count: 15.4 10*3/uL — ABNORMAL HIGH (ref 4.0–10.5)
nRBC: 0 % (ref 0.0–0.2)

## 2021-01-02 LAB — LACTATE DEHYDROGENASE: LDH: 143 U/L (ref 98–192)

## 2021-01-02 NOTE — Progress Notes (Signed)
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 MCV 84.2. ANC 8600, ALC 1500 2) 10/13/2018: WBC 11.9, Hgb 13.8, Plt 342. MCV 84.7. [place] 5200, ALC 5000 3) 07/11/2019: WBC 11.8, Hgb 14.2, Plt 357, MCV 86.1. [place] 5300, ALC 5200 4) 07/25/2019: establish care with Dr. Lorenso Courier. Flow cytometry showed monoclonal B-cell population without expression of CD5 or CD10 comprises 59% of all lymphocytes. 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  5) 08/20/2019: CLL panel returned positive for 17p13 deletion and no evidence of t(11:14). Findings consistent with diagnosis of CLL. Entered in active surveillance.   Interval History:  Jaspal Pultz 50 y.o. male with medical history significant for CLL Rai Stage 0 presents for a follow up visit. The patient's last visit was on 07/04/2020. In the interim since the last visit he has had no major changes in his health.   On exam today Mr. Oettinger is accompanied by his wife. He reports he has been well in the interim since our last visit. He is trying a low carb diet and has lost 13 lbs since February 2022. He has not yet had a sleep study performed. He continues to have some mild edema of his LE bilaterally.  He otherwise denies having issues with fevers, chills, sweats, nausea, vomiting or diarrhea.  He denies having any palpable lymphadenopathy.  A full 10 point ROS is listed below.  MEDICAL HISTORY:  Past Medical History:  Diagnosis Date  . Asthma   . Hyperlipidemia   . Hypertension   . Opioid abuse ([place])    following with methadone clinic  . Pneumonia     SURGICAL HISTORY: Past Surgical History:  Procedure Laterality Date  . right bicep Right 2013  . TRICEPS TENDON REPAIR Left 04/2013    ALLERGIES:  has No Known Allergies.  MEDICATIONS:  Current Outpatient Medications  Medication Sig Dispense Refill  . albuterol (VENTOLIN HFA) 108 (90 Base) MCG/ACT inhaler Inhale 1-2 puffs into the lungs every 6 (six) hours as needed for wheezing or shortness of breath. 1 each 1  . ketoconazole (NIZORAL) 2 % cream Apply 1 application topically daily. Apply to rash on chest 30 g 2  . lisinopril (ZESTRIL) 10 MG tablet TAKE 1 TABLET(10 MG) BY MOUTH DAILY 90 tablet 1  . Magnesium 400 MG TABS Take 400 mg by mouth daily.    . metFORMIN (GLUCOPHAGE-XR) 500 MG 24 hr tablet TAKE 1 TABLET DAILY WITH BREAKFAST 90 tablet 1  . methadone (DOLOPHINE) 10 MG/ML solution Take 85 mg by mouth daily.     . pravastatin (PRAVACHOL) 40 MG tablet TAKE 1 TABLET DAILY 90 tablet 1  . Spacer/Aero-Holding Chambers (E-Z SPACER) inhaler Use as instructed 1 each 2  . Spacer/Aero-Holding Chambers DEVI 1 Device by Does not apply route daily as needed. 1 each 0  . tamsulosin (FLOMAX) 0.4 MG CAPS capsule TAKE 1 CAPSULE DAILY 90 capsule 1  . Testosterone Enanthate (XYOSTED) 75 MG/0.5ML SOAJ INJECT 75MG (0.5ML) INTO THE SKIN ONCE WEEKLY 2 mL 5  . traZODone (DESYREL) 50 MG tablet TAKE 1 TABLET AT BEDTIME AS NEEDED FOR SLEEP 90 tablet 3  . valACYclovir (VALTREX) 500 MG tablet TAKE 1 TABLET(500 MG) BY MOUTH DAILY 90 tablet 0  . Vitamin D, Ergocalciferol, (DRISDOL) 1.25 MG (50000 UNIT)  CAPS capsule Take 1 capsule (50,000 Units total) by mouth every 7 (seven) days. 12 capsule 0   No current facility-administered medications for this visit.    REVIEW OF SYSTEMS:   Constitutional: ( - ) fevers, ( - )  chills , ( - ) night sweats Eyes: ( - ) blurriness of vision, ( - ) double vision, ( - ) watery eyes Ears, nose, mouth, throat, and face: ( - ) mucositis, ( - ) sore throat Respiratory: ( - ) cough, ( - ) dyspnea, ( - ) wheezes Cardiovascular: ( - ) palpitation, ( - ) chest discomfort, ( - ) lower extremity  swelling Gastrointestinal:  ( - ) nausea, ( - ) heartburn, ( - ) change in bowel habits Skin: ( - ) abnormal skin rashes Lymphatics: ( - ) new lymphadenopathy, ( - ) easy bruising Neurological: ( - ) numbness, ( - ) tingling, ( - ) new weaknesses Behavioral/Psych: ( - ) mood change, ( - ) new changes  All other systems were reviewed with the patient and are negative.  PHYSICAL EXAMINATION: ECOG PERFORMANCE STATUS: 0 - Asymptomatic  Vitals:   01/02/21 1500  BP: 136/84  Pulse: 68  Resp: 20  Temp: 97.6 F (36.4 C)  SpO2: 96%   Filed Weights   01/02/21 1500  Weight: 270 lb 14.4 oz (122.9 kg)    GENERAL: well appearing middle aged male in NAD  SKIN: skin color, texture, turgor are normal, no rashes or significant lesions EYES: conjunctiva are pink and non-injected, sclera clear NECK: supple, non-tender  LYMPH:  no palpable lymphadenopathy in the cervical, axillary or supraclavicular lymph nodes.  LUNGS: clear to auscultation and percussion with normal breathing effort HEART: regular rate & rhythm and no murmurs and +1 bilateral lower extremity edema ABDOMEN: limited due to body habitus.  Musculoskeletal: no cyanosis of digits and no clubbing  PSYCH: alert & oriented x 3, fluent speech NEURO: no focal motor/sensory deficits  LABORATORY DATA:  I have reviewed the data as listed CBC Latest Ref Rng & Units 01/02/2021 10/30/2020 07/29/2020  WBC 4.0 - 10.5 K/uL 15.4(H) 12.8(H) 13.4(H)  Hemoglobin 13.0 - 17.0 g/dL 15.2 16.3 16.3  Hematocrit 39.0 - 52.0 % 45.9 49.5 49.4  Platelets 150 - 400 K/uL 240 314.0 262.0    CMP Latest Ref Rng & Units 01/02/2021 10/30/2020 07/29/2020  Glucose 70 - 99 mg/dL 137(H) 133(H) 91  BUN 6 - 20 mg/dL 20 17 12   Creatinine 0.61 - 1.24 mg/dL 1.18 1.25 1.24  Sodium 135 - 145 mmol/L 137 138 138  Potassium 3.5 - 5.1 mmol/L 4.0 4.1 4.5  Chloride 98 - 111 mmol/L 102 99 100  CO2 22 - 32 mmol/L 25 33(H) 33(H)  Calcium 8.9 - 10.3 mg/dL 9.1 9.2 9.0  Total Protein  6.5 - 8.1 g/dL 6.8 6.5 6.6  Total Bilirubin 0.3 - 1.2 mg/dL 0.6 0.5 0.9  Alkaline Phos 38 - 126 U/L 64 60 51  AST 15 - 41 U/L 18 14 22   ALT 0 - 44 U/L 29 25 31     BLOOD FILM: None today.   RADIOGRAPHIC STUDIES: I have personally reviewed the radiological images as listed and agreed with the findings in the report: no evidence of lymphadenopathy or splenomegaly.   No results found.  ASSESSMENT & PLAN Elder Davidian 50 y.o. male with medical history significant for CLL Rai Stage 0 presents for a follow up visit. The patient's last visit was on 07/25/2019 at which time he  established care in our clinic.  After review of the imaging and the lab work the patient's findings are most consistent with CLL.  Given that he only has a lymphocytosis this would be considered a CLL Rai stage 0.  There is no clear indication for treatment at this time and I would recommend that he enter into active surveillance with close clinical monitoring and strict return precautions.  Today Mr. Viana notes he has been well in the interim since his last visit. He continues having issues with swelling which is currently being followed by his PCP. He is otherwise asymptomatic with no weight loss, fevers, chills, sweats, or new lymphadenopathy.   Previously we discussed the nature of CLL, the relatively good prognosis, the meaning of his a 17p13 deletion, and possible treatments moving forward.  We also discussed that this tends to be a more indolent cancer and that close monitoring which is appropriate at this time.  We also discussed the criteria for which we would want to start treatment including bulky lymphadenopathy, anemia, or thrombocytopenia.  Mr. Coia and his wife expressed their understanding of the disease and the plan moving forward.  In the event treatment is required could consider therapy with ibrutinib +/- rituximab, Acalabrutinib +/- obinutuzumab, or Venetoclax +obinutuzumab. Of these treatment options  only Venetoclax/obinutuzumab would be of limited duration (12 months). We can discuss these treatments in detail when the time comes.   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 Will continue to monitor in clinic with serial visits and blood work to assess for progression of the disease.  --plan for labs q6 months with CBC, CMP, LDH --Plan to have patient return in 6 months with PRN visits if new symptoms were to develop.   #Lower Extremity Swelling, stable #Orthopnea --currently undergoing evaluation by PCP --reports testing for OSA is under consideration. --management per PCP.   No orders of the defined types were placed in this encounter.  All questions were answered. The patient knows to call the clinic with any problems, questions or concerns.  A total of more than 30 minutes were spent on this encounter and over half of that time was spent on counseling and coordination of care as outlined above.   Ledell Peoples, MD Department of Hematology/Oncology [place] [facility] Phone: [phone] Pager: [phone] Email: [email or web address]  01/02/2021 3:27 PM

## 2021-01-07 ENCOUNTER — Telehealth: Payer: Self-pay | Admitting: Hematology and Oncology

## 2021-01-07 NOTE — Telephone Encounter (Signed)
Scheduled per los. Called and left msg. Mailed printout  °

## 2021-01-25 ENCOUNTER — Other Ambulatory Visit: Payer: Self-pay | Admitting: Family Medicine

## 2021-03-03 ENCOUNTER — Encounter: Payer: Self-pay | Admitting: Gastroenterology

## 2021-03-04 ENCOUNTER — Other Ambulatory Visit: Payer: Self-pay | Admitting: Family Medicine

## 2021-03-23 ENCOUNTER — Ambulatory Visit (AMBULATORY_SURGERY_CENTER): Payer: Self-pay | Admitting: *Deleted

## 2021-03-23 ENCOUNTER — Other Ambulatory Visit: Payer: Self-pay

## 2021-03-23 VITALS — Ht 69.0 in | Wt 275.0 lb

## 2021-03-23 DIAGNOSIS — Z1211 Encounter for screening for malignant neoplasm of colon: Secondary | ICD-10-CM

## 2021-03-23 NOTE — Progress Notes (Signed)
Virtual pre visit completed on telephone. Instructions sent through e-mail per patient request. [email or web address]  Patient agreed to hold Methadone morning of procedure to avoid excessive sleepiness.  No egg or soy allergy known to patient  No issues with past sedation with any surgeries or procedures Patient denies ever being told they had issues or difficulty with intubation  No FH of Malignant Hyperthermia No diet pills per patient No home 02 use per patient  No blood thinners per patient  Pt denies issues with constipation  No A fib or A flutter  EMMI video to pt or via [place] 19 guidelines implemented in PV today with Pt and RN   Due to the COVID-19 pandemic we are asking patients to follow certain guidelines.  Pt aware of COVID protocols and [facility] guidelines

## 2021-04-16 ENCOUNTER — Encounter: Payer: BC Managed Care – PPO | Admitting: Gastroenterology

## 2021-04-28 ENCOUNTER — Encounter: Payer: BC Managed Care – PPO | Admitting: Gastroenterology

## 2021-05-13 ENCOUNTER — Ambulatory Visit (INDEPENDENT_AMBULATORY_CARE_PROVIDER_SITE_OTHER): Payer: BC Managed Care – PPO | Admitting: Family Medicine

## 2021-05-13 ENCOUNTER — Encounter: Payer: Self-pay | Admitting: Family Medicine

## 2021-05-13 ENCOUNTER — Telehealth: Payer: Self-pay | Admitting: *Deleted

## 2021-05-13 ENCOUNTER — Other Ambulatory Visit: Payer: Self-pay

## 2021-05-13 ENCOUNTER — Other Ambulatory Visit: Payer: Self-pay | Admitting: Family Medicine

## 2021-05-13 VITALS — BP 138/90 | HR 68 | Temp 98.3°F | Ht 69.0 in | Wt 269.9 lb

## 2021-05-13 DIAGNOSIS — E119 Type 2 diabetes mellitus without complications: Secondary | ICD-10-CM | POA: Diagnosis not present

## 2021-05-13 DIAGNOSIS — E1165 Type 2 diabetes mellitus with hyperglycemia: Secondary | ICD-10-CM

## 2021-05-13 DIAGNOSIS — E538 Deficiency of other specified B group vitamins: Secondary | ICD-10-CM | POA: Diagnosis not present

## 2021-05-13 DIAGNOSIS — I10 Essential (primary) hypertension: Secondary | ICD-10-CM

## 2021-05-13 DIAGNOSIS — N529 Male erectile dysfunction, unspecified: Secondary | ICD-10-CM

## 2021-05-13 DIAGNOSIS — Z23 Encounter for immunization: Secondary | ICD-10-CM

## 2021-05-13 DIAGNOSIS — E7800 Pure hypercholesterolemia, unspecified: Secondary | ICD-10-CM

## 2021-05-13 DIAGNOSIS — R7989 Other specified abnormal findings of blood chemistry: Secondary | ICD-10-CM | POA: Diagnosis not present

## 2021-05-13 DIAGNOSIS — J45901 Unspecified asthma with (acute) exacerbation: Secondary | ICD-10-CM | POA: Diagnosis not present

## 2021-05-13 DIAGNOSIS — F1129 Opioid dependence with unspecified opioid-induced disorder: Secondary | ICD-10-CM | POA: Diagnosis not present

## 2021-05-13 DIAGNOSIS — C9110 Chronic lymphocytic leukemia of B-cell type not having achieved remission: Secondary | ICD-10-CM

## 2021-05-13 DIAGNOSIS — B001 Herpesviral vesicular dermatitis: Secondary | ICD-10-CM

## 2021-05-13 LAB — CBC WITH DIFFERENTIAL/PLATELET
Basophils Absolute: 0.1 10*3/uL (ref 0.0–0.1)
Basophils Relative: 0.9 % (ref 0.0–3.0)
Eosinophils Absolute: 0.7 10*3/uL (ref 0.0–0.7)
Eosinophils Relative: 5.2 % — ABNORMAL HIGH (ref 0.0–5.0)
HCT: 50.4 % (ref 39.0–52.0)
Hemoglobin: 16.4 g/dL (ref 13.0–17.0)
Lymphocytes Relative: 48.4 % — ABNORMAL HIGH (ref 12.0–46.0)
Lymphs Abs: 6.1 10*3/uL — ABNORMAL HIGH (ref 0.7–4.0)
MCHC: 32.6 g/dL (ref 30.0–36.0)
MCV: 88.2 fl (ref 78.0–100.0)
Monocytes Absolute: 0.7 10*3/uL (ref 0.1–1.0)
Monocytes Relative: 5.2 % (ref 3.0–12.0)
Neutro Abs: 5.1 10*3/uL (ref 1.4–7.7)
Neutrophils Relative %: 40.3 % — ABNORMAL LOW (ref 43.0–77.0)
Platelets: 290 10*3/uL (ref 150.0–400.0)
RBC: 5.72 Mil/uL (ref 4.22–5.81)
RDW: 13.1 % (ref 11.5–15.5)
WBC: 12.5 10*3/uL — ABNORMAL HIGH (ref 4.0–10.5)

## 2021-05-13 LAB — LIPID PANEL
Cholesterol: 191 mg/dL (ref 0–200)
HDL: 33.4 mg/dL — ABNORMAL LOW (ref >39.00)
LDL Cholesterol: 127 mg/dL — ABNORMAL HIGH (ref 0–99)
NonHDL: 157.16
Total CHOL/HDL Ratio: 6
Triglycerides: 149 mg/dL (ref 0.0–149.0)
VLDL: 29.8 mg/dL (ref 0.0–40.0)

## 2021-05-13 LAB — COMPREHENSIVE METABOLIC PANEL
ALT: 48 U/L (ref 0–53)
AST: 29 U/L (ref 0–37)
Albumin: 4.7 g/dL (ref 3.5–5.2)
Alkaline Phosphatase: 61 U/L (ref 39–117)
BUN: 18 mg/dL (ref 6–23)
CO2: 29 mEq/L (ref 19–32)
Calcium: 9.6 mg/dL (ref 8.4–10.5)
Chloride: 96 mEq/L (ref 96–112)
Creatinine, Ser: 1.14 mg/dL (ref 0.40–1.50)
GFR: 76.39 mL/min (ref >60.00)
Glucose, Bld: 98 mg/dL (ref 70–99)
Potassium: 4.4 mEq/L (ref 3.5–5.1)
Sodium: 135 mEq/L (ref 135–145)
Total Bilirubin: 0.7 mg/dL (ref 0.2–1.2)
Total Protein: 7.5 g/dL (ref 6.0–8.3)

## 2021-05-13 LAB — PSA: PSA: 0.17 ng/mL (ref 0.10–4.00)

## 2021-05-13 LAB — VITAMIN B12: Vitamin B-12: 573 pg/mL (ref 211–911)

## 2021-05-13 LAB — FOLATE: Folate: 17.8 ng/mL (ref >5.9)

## 2021-05-13 LAB — HEMOGLOBIN A1C: Hgb A1c MFr Bld: 7.1 % — ABNORMAL HIGH (ref 4.6–6.5)

## 2021-05-13 MED ORDER — TAMSULOSIN HCL 0.4 MG PO CAPS: 0.4000 mg | ORAL_CAPSULE | Freq: Every day | ORAL | 3 refills | Status: DC

## 2021-05-13 MED ORDER — LISINOPRIL 40 MG PO TABS: 40.0000 mg | ORAL_TABLET | Freq: Every day | ORAL | 1 refills | Status: DC

## 2021-05-13 MED ORDER — VALACYCLOVIR HCL 1 G PO TABS: ORAL_TABLET | ORAL | 5 refills | Status: DC

## 2021-05-13 MED ORDER — COMFORT TOUCH BP CUFF/LARGE MISC: 1.0000 | Freq: Every day | 0 refills | Status: AC | PRN

## 2021-05-13 MED ORDER — SILDENAFIL CITRATE 100 MG PO TABS: 50.0000 mg | ORAL_TABLET | Freq: Every day | ORAL | 0 refills | Status: DC | PRN

## 2021-05-13 MED ORDER — METFORMIN HCL ER 500 MG PO TB24: 500.0000 mg | ORAL_TABLET | Freq: Every day | ORAL | 1 refills | Status: DC

## 2021-05-13 MED ORDER — BLOOD GLUCOSE MONITOR KIT: PACK | 0 refills | Status: AC

## 2021-05-13 NOTE — Telephone Encounter (Signed)
Spoke with Raven at [facility] and she stated the Rx will be cancelled as below.

## 2021-05-13 NOTE — Patient Instructions (Signed)
I will let you know about testosterone levels and adjustments for that once I get bloodwork.   We are going to add ozempic 0.25mg  weekly. Let me know how you do with this in about 3-4 weeks. Record blood sugars if you check them so you can let me know about this.

## 2021-05-13 NOTE — Progress Notes (Signed)
Terry [name] DOB: 18-Jul-1971 Encounter date: 05/13/2021  This is a 50 y.o. male who presents with Chief Complaint  Patient presents with   Hypertension    Patient complains of elevated BP readings x2 weeks, highest of 172/90, lowest reading was 138/86, states he increased Lisinopril to 20mg  daily versus 10mg  as instructed by PCP 1 week ago    History of present illness:  CLL: Following with Dr. Lorenso Courier regularly.  Stage 0.  Regular blood work and no indication to begin treatment.  Last visit with me was 10/22/2020.  He was referred at that time for colonoscopy.  It appears the visits were canceled. He needs to reschedule.   Is having significant muscle cramps at last visit. Hypertension: Lisinopril 10 mg daily. Has been checking at home; he was surprised by today's reading. Home readings 142-172/72-92  home cuff was 160/90's after our in office was 138/90. He did take extra 5mg  of lisinopril when pressure was up and then increased to lisinopril 20mg  daily - still doesn't seem like it is coming down.   Hyperlipidemia: Pravastatin 40 mg daily. No muscle aches with this.   Low testosterone: 75 mg weekly injection Xyosted. Erectile ability is better. Due for xosted shot now. Sexual drive is poor. Morning drive, erections are better. Does feel like morning erections are better shortly after taking shot. Morning erections only time he can maintain.  Diabetes type 2: Metformin 500 mg daily.  Last A1c was 7.3  Taking vitamin D and b12; hasn't noted much difference with this energy wise, but cramps resolved. Did restart magnesium at low dose. Has been going to gym regularly and finding energy level for this is better. Working a little less as well which is helpful.    No Known Allergies Current Meds  Medication Sig   albuterol (VENTOLIN HFA) 108 (90 Base) MCG/ACT inhaler Inhale 1-2 puffs into the lungs every 6 (six) hours as needed for wheezing or shortness of breath.   ketoconazole  (NIZORAL) 2 % cream Apply 1 application topically daily. Apply to rash on chest   lisinopril (ZESTRIL) 40 MG tablet Take 1 tablet (40 mg total) by mouth daily.   Magnesium 400 MG TABS Take 400 mg by mouth daily.   metFORMIN (GLUCOPHAGE-XR) 500 MG 24 hr tablet TAKE 1 TABLET DAILY WITH BREAKFAST   methadone (DOLOPHINE) 10 MG/ML solution Take 85 mg by mouth daily.    multivitamin-iron-minerals-folic acid (CENTRUM) chewable tablet Chew 1 tablet by mouth daily.   Polyethylene Glycol 3350 (MIRALAX PO) Take 238 g by mouth.   pravastatin (PRAVACHOL) 40 MG tablet TAKE 1 TABLET DAILY   Spacer/Aero-Holding Chambers (E-Z SPACER) inhaler Use as instructed   Spacer/Aero-Holding Chambers DEVI 1 Device by Does not apply route daily as needed.   tamsulosin (FLOMAX) 0.4 MG CAPS capsule TAKE 1 CAPSULE DAILY   Testosterone Enanthate (XYOSTED) 75 MG/0.5ML SOAJ INJECT 75MG (0.5ML) INTO THE SKIN ONCE WEEKLY   traZODone (DESYREL) 50 MG tablet TAKE 1 TABLET AT BEDTIME AS NEEDED FOR SLEEP   valACYclovir (VALTREX) 500 MG tablet TAKE 1 TABLET(500 MG) BY MOUTH DAILY   [DISCONTINUED] lisinopril (ZESTRIL) 10 MG tablet TAKE 1 TABLET(10 MG) BY MOUTH DAILY   [DISCONTINUED] Vitamin D, Ergocalciferol, (DRISDOL) 1.25 MG (50000 UNIT) CAPS capsule Take 1 capsule (50,000 Units total) by mouth every 7 (seven) days.    Review of Systems  Constitutional:  Positive for unexpected weight change (gaining weight). Negative for chills, fatigue and fever.  Respiratory:  Negative for cough, chest tightness,  shortness of breath and wheezing.   Cardiovascular:  Negative for chest pain, palpitations and leg swelling.   Objective:  BP 138/90 (BP Location: Left Arm, Patient Position: Sitting, Cuff Size: Large)   Pulse 68   Temp 98.3 F (36.8 C) (Oral)   Ht 5\' 9"  (1.753 m)   Wt 269 lb 14.4 oz (122.4 kg)   SpO2 95%   BMI 39.86 kg/m   Weight: 269 lb 14.4 oz (122.4 kg)   BP Readings from Last 3 Encounters:  05/13/21 138/90  01/02/21  136/84  10/22/20 112/80   Wt Readings from Last 3 Encounters:  05/13/21 269 lb 14.4 oz (122.4 kg)  03/23/21 275 lb (124.7 kg)  01/02/21 270 lb 14.4 oz (122.9 kg)    Physical Exam Constitutional:      General: He is not in acute distress.    Appearance: He is well-developed and overweight.  HENT:     Head: Normocephalic and atraumatic.  Cardiovascular:     Rate and Rhythm: Normal rate and regular rhythm.     Heart sounds: Normal heart sounds. No murmur heard.   No friction rub.  Pulmonary:     Effort: Pulmonary effort is normal. No respiratory distress.     Breath sounds: Normal breath sounds. No wheezing or rales.  Abdominal:     General: Abdomen is protuberant. Bowel sounds are normal. There is no distension.     Palpations: Abdomen is soft.     Tenderness: There is no abdominal tenderness. There is no guarding.  Musculoskeletal:     Right lower leg: No edema.     Left lower leg: No edema.  Feet:     Comments: Normal bilateral monofilament foot exam.  Mild heel callus.  Skin is intact. Skin:    General: Skin is warm and dry.     Comments: Sensory exam of the foot is normal, tested with the monofilament. Good pulses, no lesions or ulcers, good peripheral pulses.  Neurological:     Mental Status: He is alert and oriented to person, place, and time.  Psychiatric:        Behavior: Behavior normal.        Judgment: Judgment normal.    Assessment/Plan  1. Primary hypertension Blood pressure is not adequately controlled.  We are going to increase lisinopril to 40 mg daily.  He will monitor at home and let me know if not returning to normal range within a couple of weeks.  We discussed adding on hydrochlorothiazide if needed.  Since he does occasionally experience some muscle cramps. - CBC with Differential/Platelet; Future - Comprehensive metabolic panel; Future - Comprehensive metabolic panel - CBC with Differential/Platelet  2. Asthma, chronic, unspecified asthma  severity, with acute exacerbation Breathing has been well controlled.  Albuterol as needed.  3. Pure hypercholesterolemia He is tolerating pravastatin 40 mg daily well. - Lipid panel; Future - Lipid panel  4. Opioid dependence with opioid-induced disorder [facility]) He follows with methadone clinic.  He feels he is doing well in his continued recovery.  5. CLL (chronic lymphocytic leukemia) ([place]) Following regularly with oncology.  Lab work is been stable.  6. B12 deficiency Has had some improvement in cramps (may also be due to him taking magnesium), will make sure that B12 is adequately treated. - Vitamin B12; Future - Methylmalonic acid, serum; Future - Homocysteine; Future - Folate; Future - Folate - Homocysteine - Methylmalonic acid, serum - Vitamin B12  7. Recurrent cold sores - valACYclovir (VALTREX) 1000 MG  tablet; Take 2 tablets PO BID x 1 day at onset of symptoms.  Dispense: 30 tablet; Refill: 5  8. Erectile dysfunction, unspecified erectile dysfunction type Sexual drive is poor.  We discussed adjusting testosterone treatment pending lab results. - sildenafil (VIAGRA) 100 MG tablet; Take 0.5 tablets (50 mg total) by mouth daily as needed for erectile dysfunction.  Dispense: 30 tablet; Refill: 0  9. Controlled type 2 diabetes mellitus without complication, without long-term current use of insulin ([place]) Ozempic sample given to him today in the office.  He is interested in something to help control sugar but also help with weight loss. Discussed new medication(s) today with patient. Discussed potential side effects and patient verbalized understanding.  - Hemoglobin A1c; Future - Hemoglobin A1c  10. Low testosterone in male - PSA; Future - PSA  11. Controlled type 2 diabetes mellitus with hyperglycemia, without long-term current use of insulin ([place]) See above.  1 month supply of Ozempic sample given 0.25 mg weekly dose. - metFORMIN (GLUCOPHAGE-XR) 500 MG 24 hr tablet;  Take 1 tablet (500 mg total) by mouth daily with breakfast.  Dispense: 90 tablet; Refill: 1  12. Need for immunization against influenza - Flu Vaccine QUAD 6+ mos PF IM (Fluarix Quad PF)  Return in about 3 months (around 08/13/2021) for Chronic condition visit.  50 minutes spent in chart review, time with patient, discussion of diabetic treatment, vitamin supplementation, exam, charting.    Micheline Rough, MD

## 2021-05-13 NOTE — Telephone Encounter (Signed)
-----   Message from [name], MD sent at 05/13/2021 11:30 AM EDT ----- Please cancel lisinopril 40mg  dose to [facility]. [facility]

## 2021-05-14 ENCOUNTER — Other Ambulatory Visit: Payer: Self-pay | Admitting: Family Medicine

## 2021-05-14 MED ORDER — OZEMPIC (0.25 OR 0.5 MG/DOSE) 2 MG/1.5ML ~~LOC~~ SOPN: 0.5000 mg | PEN_INJECTOR | SUBCUTANEOUS | 0 refills | Status: DC

## 2021-05-14 MED ORDER — ROSUVASTATIN CALCIUM 10 MG PO TABS: 10.0000 mg | ORAL_TABLET | Freq: Every day | ORAL | 3 refills | Status: DC

## 2021-05-16 LAB — METHYLMALONIC ACID, SERUM: Methylmalonic Acid, Quant: 93 nmol/L (ref 87–318)

## 2021-05-16 LAB — HOMOCYSTEINE: Homocysteine: 9.3 umol/L (ref <11.4)

## 2021-05-29 ENCOUNTER — Other Ambulatory Visit: Payer: Self-pay

## 2021-05-29 ENCOUNTER — Telehealth: Payer: Self-pay

## 2021-05-29 ENCOUNTER — Ambulatory Visit: Payer: BC Managed Care – PPO | Admitting: Internal Medicine

## 2021-05-29 ENCOUNTER — Telehealth: Payer: Self-pay | Admitting: Family Medicine

## 2021-05-29 DIAGNOSIS — R0602 Shortness of breath: Secondary | ICD-10-CM

## 2021-05-29 MED ORDER — LISINOPRIL 40 MG PO TABS: 40.0000 mg | ORAL_TABLET | Freq: Every day | ORAL | 0 refills | Status: DC

## 2021-05-29 MED ORDER — ALBUTEROL SULFATE HFA 108 (90 BASE) MCG/ACT IN AERS: 1.0000 | INHALATION_SPRAY | Freq: Four times a day (QID) | RESPIRATORY_TRACT | 1 refills | Status: DC | PRN

## 2021-05-29 NOTE — Telephone Encounter (Signed)
Refill was sent

## 2021-05-29 NOTE — Telephone Encounter (Signed)
Pt is waiting on  lisinopril 40 mg [facility]. Pt would like 14 day supply send to  [facility], [place] [phone]

## 2021-05-29 NOTE — Telephone Encounter (Signed)
RX sent

## 2021-05-29 NOTE — Telephone Encounter (Signed)
Patient called stating he needed Rx refill for  albuterol (VENTOLIN HFA) 108 (90 Base) MCG/ACT inhaler Pt was transferred to triage nurse for wheezing and nurse asked if pt could be seen today I offered pt a 1:30 appt and at first he was going to take it but then decided not because all he wanted was a refill, so a refill was sent in to the pharmacy for Rx above.

## 2021-06-10 ENCOUNTER — Telehealth: Payer: Self-pay | Admitting: Family Medicine

## 2021-06-10 NOTE — Telephone Encounter (Signed)
Patient called because he has two questions for Dr.Koberlein   Patient wants to know when he will be able to pick up second month sample of ozempic  Patient would also like to make sure he is supposed to stop taking the pravastatin. He has not taken it since being put on the rosuvastatin.

## 2021-06-12 MED ORDER — OZEMPIC (0.25 OR 0.5 MG/DOSE) 2 MG/1.5ML ~~LOC~~ SOPN: 0.5000 mg | PEN_INJECTOR | SUBCUTANEOUS | 3 refills | Status: DC

## 2021-06-12 NOTE — Telephone Encounter (Signed)
He should only be on rosuvastatin; not pravastatin. Let's try to send ozempic 0.5mg  weekly dose to his pharmacy and see if they cover this medication for him. I think it will be a good med if he is tolerating it ok.   Micheline Rough, MD

## 2021-06-12 NOTE — Telephone Encounter (Signed)
Spoke with the patient and informed him of the message below.  Patient is aware the Rx was sent to [facility].

## 2021-06-23 ENCOUNTER — Other Ambulatory Visit: Payer: Self-pay | Admitting: Family Medicine

## 2021-07-06 ENCOUNTER — Inpatient Hospital Stay: Payer: BC Managed Care – PPO

## 2021-07-06 ENCOUNTER — Inpatient Hospital Stay: Payer: BC Managed Care – PPO | Attending: Hematology and Oncology | Admitting: Hematology and Oncology

## 2021-07-06 ENCOUNTER — Other Ambulatory Visit: Payer: Self-pay | Admitting: Hematology and Oncology

## 2021-07-06 ENCOUNTER — Other Ambulatory Visit: Payer: Self-pay

## 2021-07-06 VITALS — BP 149/85 | HR 75 | Temp 97.9°F | Resp 18 | Ht 69.0 in | Wt 276.6 lb

## 2021-07-06 DIAGNOSIS — K219 Gastro-esophageal reflux disease without esophagitis: Secondary | ICD-10-CM | POA: Diagnosis not present

## 2021-07-06 DIAGNOSIS — J45909 Unspecified asthma, uncomplicated: Secondary | ICD-10-CM | POA: Diagnosis not present

## 2021-07-06 DIAGNOSIS — Z7984 Long term (current) use of oral hypoglycemic drugs: Secondary | ICD-10-CM | POA: Insufficient documentation

## 2021-07-06 DIAGNOSIS — I10 Essential (primary) hypertension: Secondary | ICD-10-CM | POA: Insufficient documentation

## 2021-07-06 DIAGNOSIS — D72820 Lymphocytosis (symptomatic): Secondary | ICD-10-CM | POA: Diagnosis not present

## 2021-07-06 DIAGNOSIS — C9110 Chronic lymphocytic leukemia of B-cell type not having achieved remission: Secondary | ICD-10-CM

## 2021-07-06 DIAGNOSIS — E119 Type 2 diabetes mellitus without complications: Secondary | ICD-10-CM | POA: Diagnosis not present

## 2021-07-06 DIAGNOSIS — E785 Hyperlipidemia, unspecified: Secondary | ICD-10-CM | POA: Diagnosis not present

## 2021-07-06 LAB — CBC WITH DIFFERENTIAL (CANCER CENTER ONLY)
Abs Immature Granulocytes: 0.03 10*3/uL (ref 0.00–0.07)
Basophils Absolute: 0.1 10*3/uL (ref 0.0–0.1)
Basophils Relative: 1 %
Eosinophils Absolute: 0.6 10*3/uL — ABNORMAL HIGH (ref 0.0–0.5)
Eosinophils Relative: 4 %
HCT: 45.1 % (ref 39.0–52.0)
Hemoglobin: 15.3 g/dL (ref 13.0–17.0)
Immature Granulocytes: 0 %
Lymphocytes Relative: 51 %
Lymphs Abs: 7.4 10*3/uL — ABNORMAL HIGH (ref 0.7–4.0)
MCH: 29 pg (ref 26.0–34.0)
MCHC: 33.9 g/dL (ref 30.0–36.0)
MCV: 85.4 fL (ref 80.0–100.0)
Monocytes Absolute: 0.8 10*3/uL (ref 0.1–1.0)
Monocytes Relative: 6 %
Neutro Abs: 5.3 10*3/uL (ref 1.7–7.7)
Neutrophils Relative %: 38 %
Platelet Count: 258 10*3/uL (ref 150–400)
RBC: 5.28 MIL/uL (ref 4.22–5.81)
RDW: 11.9 % (ref 11.5–15.5)
Smear Review: NORMAL
WBC Count: 14.2 10*3/uL — ABNORMAL HIGH (ref 4.0–10.5)
nRBC: 0 % (ref 0.0–0.2)

## 2021-07-06 LAB — LACTATE DEHYDROGENASE: LDH: 145 U/L (ref 98–192)

## 2021-07-06 LAB — CMP (CANCER CENTER ONLY)
ALT: 33 U/L (ref 0–44)
AST: 18 U/L (ref 15–41)
Albumin: 3.8 g/dL (ref 3.5–5.0)
Alkaline Phosphatase: 64 U/L (ref 38–126)
Anion gap: 8 (ref 5–15)
BUN: 15 mg/dL (ref 6–20)
CO2: 27 mmol/L (ref 22–32)
Calcium: 8.7 mg/dL — ABNORMAL LOW (ref 8.9–10.3)
Chloride: 102 mmol/L (ref 98–111)
Creatinine: 1.18 mg/dL (ref 0.61–1.24)
GFR, Estimated: >60 mL/min (ref >60)
Glucose, Bld: 128 mg/dL — ABNORMAL HIGH (ref 70–99)
Potassium: 4.4 mmol/L (ref 3.5–5.1)
Sodium: 137 mmol/L (ref 135–145)
Total Bilirubin: 0.8 mg/dL (ref 0.3–1.2)
Total Protein: 6.9 g/dL (ref 6.5–8.1)

## 2021-07-06 NOTE — Progress Notes (Signed)
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 MCV 84.2. ANC 8600, ALC 1500 2) 10/13/2018: WBC 11.9, Hgb 13.8, Plt 342. MCV 84.7. [place] 5200, ALC 5000 3) 07/11/2019: WBC 11.8, Hgb 14.2, Plt 357, MCV 86.1. [place] 5300, ALC 5200 4) 07/25/2019: establish care with Dr. Lorenso [name]. Flow cytometry showed monoclonal B-cell population without expression of CD5 or CD10 comprises 59% of all lymphocytes. 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  5) 08/20/2019: CLL panel returned positive for 17p13 deletion and no evidence of t(11:14). Findings consistent with diagnosis of CLL. Entered in active surveillance.   Interval History:  Terry [name] 50 y.o. male with medical history significant for CLL Rai Stage 0 presents for a follow up visit. The patient's last visit was on 01/02/2021. In the interim since the last visit he has had no major changes in his health.   On exam today Terry [name] reports he has been well in the interim since our last visit. He notes that his lower extremity swelling has improved considerably in the interim since her last visit.  He also did unfortunately have a bout of ringworm and he is unsure how this occurred.  He has had it before when he was doing jujitsu and picked it up from the mat.  He thinks that because he takes methadone and it causes him to sweat and makes him more likely to develop ringworm.  He denies any other new major changes in his health.  He otherwise denies having issues with fevers, chills, sweats, nausea, vomiting or diarrhea.  He denies having any palpable lymphadenopathy.  A full 10 point ROS is listed below.  MEDICAL HISTORY:  Past Medical History:  Diagnosis Date   Asthma    Cancer ([place])     Diabetes mellitus without complication ([place])    GERD (gastroesophageal reflux disease)    Hyperlipidemia    Hypertension    Opioid abuse ([place])    following with methadone clinic   Pneumonia     SURGICAL HISTORY: Past Surgical History:  Procedure Laterality Date   right bicep Right 2013   TRICEPS TENDON REPAIR Left 04/2013   ALLERGIES:  has No Known Allergies.  MEDICATIONS:  Current Outpatient Medications  Medication Sig Dispense Refill   albuterol (VENTOLIN HFA) 108 (90 Base) MCG/ACT inhaler Inhale 1-2 puffs into the lungs every 6 (six) hours as needed for wheezing or shortness of breath. 1 each 1   blood glucose meter kit and supplies KIT Dispense based on patient and insurance preference. Use up to four times daily as directed. 1 each 0   Blood Pressure Monitoring (COMFORT TOUCH BP CUFF/LARGE) MISC 1 each by Does not apply route daily as needed. 1 each 0   ketoconazole (NIZORAL) 2 % cream Apply 1 application topically daily. Apply to rash on chest 30 g 2   lisinopril (ZESTRIL) 40 MG tablet Take 1 tablet (40 mg total) by mouth daily. 30 tablet 0   Magnesium 400 MG TABS Take 400 mg by mouth daily.     metFORMIN (GLUCOPHAGE-XR) 500 MG 24 hr tablet Take 1 tablet (500 mg total) by mouth daily with breakfast. 90 tablet 1   methadone (DOLOPHINE) 10 MG/ML solution Take 85 mg by mouth daily.      multivitamin-iron-minerals-folic  acid (CENTRUM) chewable tablet Chew 1 tablet by mouth daily.     Polyethylene Glycol 3350 (MIRALAX PO) Take 238 g by mouth.     rosuvastatin (CRESTOR) 10 MG tablet Take 1 tablet (10 mg total) by mouth daily. 90 tablet 3   Semaglutide,0.25 or 0.5MG/DOS, (OZEMPIC, 0.25 OR 0.5 MG/DOSE,) 2 MG/1.5ML SOPN Inject 0.5 mg into the skin once a week. 1.5 mL 3   sildenafil (VIAGRA) 100 MG tablet Take 0.5 tablets (50 mg total) by mouth daily as needed for erectile dysfunction. 30 tablet 0   Spacer/Aero-Holding Chambers (E-Z SPACER) inhaler Use as instructed 1 each 2    Spacer/Aero-Holding Chambers DEVI 1 Device by Does not apply route daily as needed. 1 each 0   tamsulosin (FLOMAX) 0.4 MG CAPS capsule Take 1 capsule (0.4 mg total) by mouth daily. 90 capsule 3   Testosterone Enanthate (XYOSTED) 75 MG/0.5ML SOAJ INJECT 0.5MLS INTO THE SKIN ONCE WEEKLY 2 mL 2   traZODone (DESYREL) 50 MG tablet TAKE 1 TABLET AT BEDTIME AS NEEDED FOR SLEEP 90 tablet 3   valACYclovir (VALTREX) 1000 MG tablet Take 2 tablets PO BID x 1 day at onset of symptoms. 30 tablet 5   No current facility-administered medications for this visit.    REVIEW OF SYSTEMS:   Constitutional: ( - ) fevers, ( - )  chills , ( - ) night sweats Eyes: ( - ) blurriness of vision, ( - ) double vision, ( - ) watery eyes Ears, nose, mouth, throat, and face: ( - ) mucositis, ( - ) sore throat Respiratory: ( - ) cough, ( - ) dyspnea, ( - ) wheezes Cardiovascular: ( - ) palpitation, ( - ) chest discomfort, ( - ) lower extremity swelling Gastrointestinal:  ( - ) nausea, ( - ) heartburn, ( - ) change in bowel habits Skin: ( - ) abnormal skin rashes Lymphatics: ( - ) new lymphadenopathy, ( - ) easy bruising Neurological: ( - ) numbness, ( - ) tingling, ( - ) new weaknesses Behavioral/Psych: ( - ) mood change, ( - ) new changes  All other systems were reviewed with the patient and are negative.  PHYSICAL EXAMINATION: ECOG PERFORMANCE STATUS: 0 - Asymptomatic  Vitals:   07/06/21 1108  BP: (!) 149/85  Pulse: 75  Resp: 18  Temp: 97.9 F (36.6 C)  SpO2: 96%    Filed Weights   07/06/21 1108  Weight: 276 lb 9.6 oz (125.5 kg)     GENERAL: well appearing middle aged male in NAD  SKIN: skin color, texture, turgor are normal, no rashes or significant lesions EYES: conjunctiva are pink and non-injected, sclera clear NECK: supple, non-tender  LYMPH:  no palpable lymphadenopathy in the cervical, axillary or supraclavicular lymph nodes.  LUNGS: clear to auscultation and percussion with normal breathing  effort HEART: regular rate & rhythm and no murmurs and +1 bilateral lower extremity edema ABDOMEN: limited due to body habitus.  Musculoskeletal: no cyanosis of digits and no clubbing  PSYCH: alert & oriented x 3, fluent speech NEURO: no focal motor/sensory deficits  LABORATORY DATA:  I have reviewed the data as listed CBC Latest Ref Rng & Units 07/06/2021 05/13/2021 01/02/2021  WBC 4.0 - 10.5 K/uL 14.2(H) 12.5(H) 15.4(H)  Hemoglobin 13.0 - 17.0 g/dL 15.3 16.4 15.2  Hematocrit 39.0 - 52.0 % 45.1 50.4 45.9  Platelets 150 - 400 K/uL 258 290.0 240    CMP Latest Ref Rng & Units 07/06/2021 05/13/2021 01/02/2021  Glucose 70 - 99  mg/dL 128(H) 98 137(H)  BUN 6 - 20 mg/dL _0 Creatinine 0.61 - 1.24 mg/dL 1.18 1.14 1.18  Sodium 135 - 145 mmol/L 137 135 137  Potassium 3.5 - 5.1 mmol/L 4.4 4.4 4.0  Chloride 98 - 111 mmol/L 102 96 102  CO2 22 - 32 mmol/L _1 Calcium 8.9 - 10.3 mg/dL 8.7(L) 9.6 9.1  Total Protein 6.5 - 8.1 g/dL 6.9 7.5 6.8  Total Bilirubin 0.3 - 1.2 mg/dL 0.8 0.7 0.6  Alkaline Phos 38 - 126 U/L 64 61 64  AST 15 - 41 U/L _2 ALT 0 - 44 U/L 33 48 29    BLOOD FILM: None today.   RADIOGRAPHIC STUDIES: I have personally reviewed the radiological images as listed and agreed with the findings in the report: no evidence of lymphadenopathy or splenomegaly.   No results found.  ASSESSMENT & PLAN Terry [name] 50 y.o. male with medical history significant for CLL Rai Stage 0 presents for a follow up visit. The patient's last visit was on 07/25/2019 at which time he established care in our clinic.  After review of the imaging and the lab work the patient's findings are most consistent with CLL.  Given that he only has a lymphocytosis this would be considered a CLL Rai stage 0.  There is no clear indication for treatment at this time and I would recommend that he enter into active surveillance with close clinical monitoring and strict return precautions.  Today Terry [name] notes he has been well in the interim since his last visit. He continues having issues with swelling which is currently being followed by his PCP. He is otherwise asymptomatic with no weight loss, fevers, chills, sweats, or new lymphadenopathy.   Previously we discussed the nature of CLL, the relatively good prognosis, the meaning of his a 17p13 deletion, and possible treatments moving forward.  We also discussed that this tends to be a more indolent cancer and that close monitoring which is appropriate at this time.  We also discussed the criteria for which we would want to start treatment including bulky lymphadenopathy, anemia, or thrombocytopenia.  Terry [name] and his wife expressed their understanding of the disease and the plan moving forward.  In the event treatment is required could consider therapy with ibrutinib +/- rituximab, Acalabrutinib +/- obinutuzumab, or Venetoclax +obinutuzumab. Of these treatment options only Venetoclax/obinutuzumab would be of limited duration (12 months). We can discuss these treatments in detail when the time comes.   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 Will continue to monitor in clinic with serial visits and blood work to assess for progression of the disease.  -- Today labs show white blood cell count 14.2, hemoglobin 15.3, platelets of 258. --plan for labs q6 months with CBC, CMP, LDH --Plan to have patient return in 6 months with PRN visits if new symptoms were to develop.   #Lower Extremity Swelling, stable #Orthopnea --currently undergoing evaluation by PCP --reports testing for OSA is under consideration. --management per PCP.   No orders of the defined types were placed in this encounter.  All questions were answered. The patient knows to call the clinic with any problems, questions or concerns.  A total of more than 30 minutes were spent on this encounter and over half of  that time was spent on counseling and coordination of care as outlined above.    T.  Lorenso Courier, MD Department of Hematology/Oncology [place] [facility] Phone: [phone] Pager: [phone] Email: [email or web address]  07/06/2021 12:32 PM

## 2021-08-14 ENCOUNTER — Ambulatory Visit: Payer: BC Managed Care – PPO | Admitting: Family Medicine

## 2021-09-17 ENCOUNTER — Other Ambulatory Visit: Payer: Self-pay | Admitting: Family Medicine

## 2021-09-17 DIAGNOSIS — R0602 Shortness of breath: Secondary | ICD-10-CM

## 2021-09-30 IMAGING — CT CT CHEST W/ CM
2 of 5 series · 13 of 36 positions shown, 16 images · IV contrast (OMNIPAQUE)
Comparison: None.

CLINICAL DATA: Cancer of unknown primary, monoclonal B-cell
population concerning for lymphoma, evaluate for lymphadenopathy

EXAM:
CT CHEST, ABDOMEN, AND PELVIS WITH CONTRAST
TECHNIQUE: Multidetector CT imaging of the chest, abdomen and pelvis was
performed following the standard protocol during bolus
administration of intravenous contrast.
CONTRAST:  100mL OMNIPAQUE IOHEXOL 300 MG/ML SOLN, additional oral
enteric contrast

[Series 2: cap with · axial · 0.98mm/px · z∈[+1194,+1734]mm · 10 of 134 slices shown, 13 images]
[im 13/134  mediastinal]
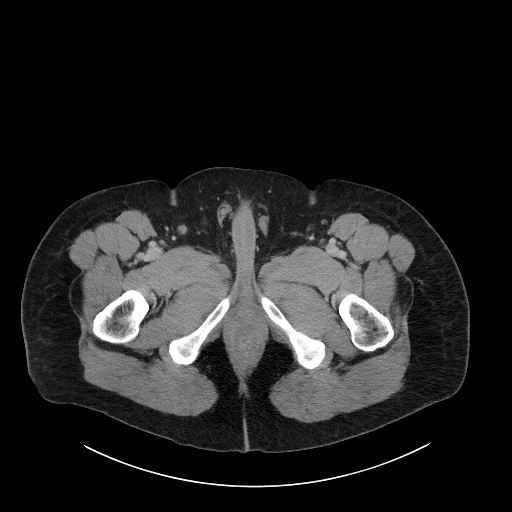
[im 13/134  lung]
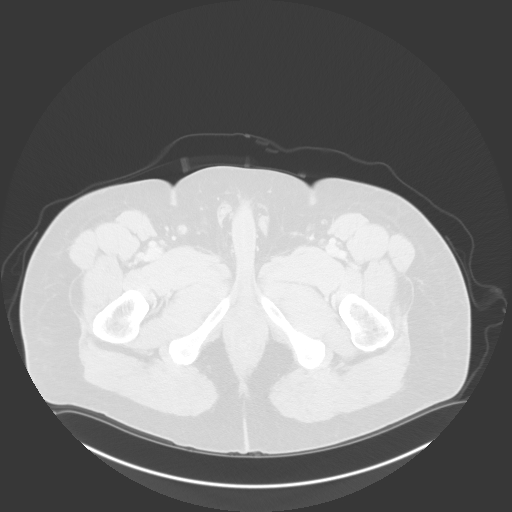
[im 25/134  lung]
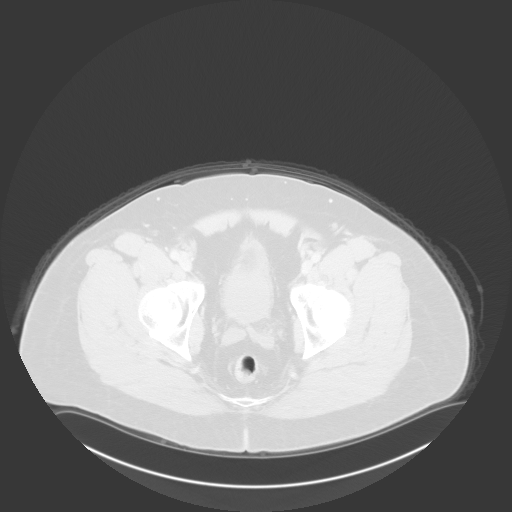
[im 37/134  lung]
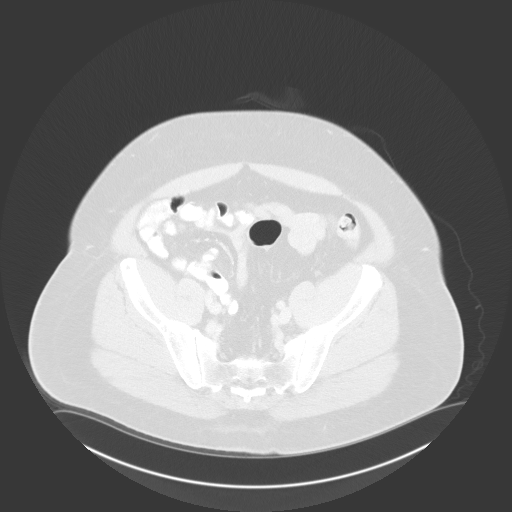
[im 49/134  lung]
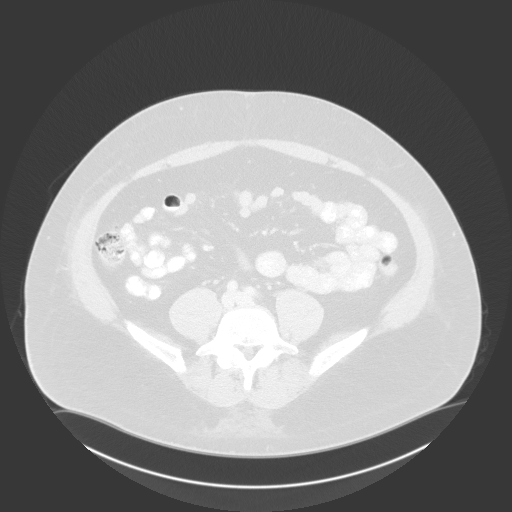
[im 61/134  mediastinal]
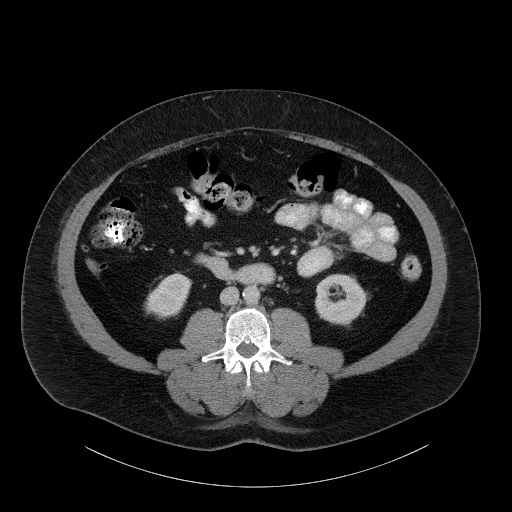
[im 61/134  lung]
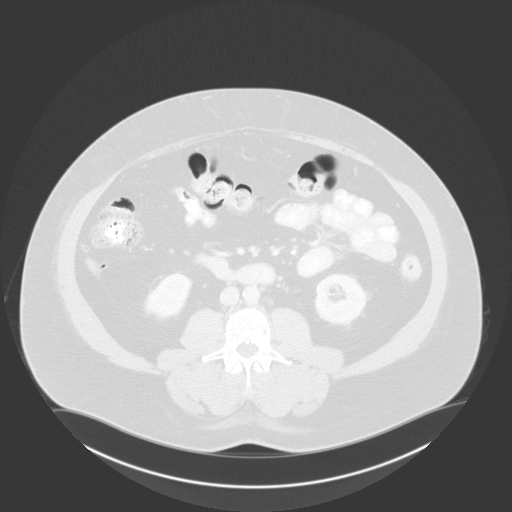
[im 73/134  lung]
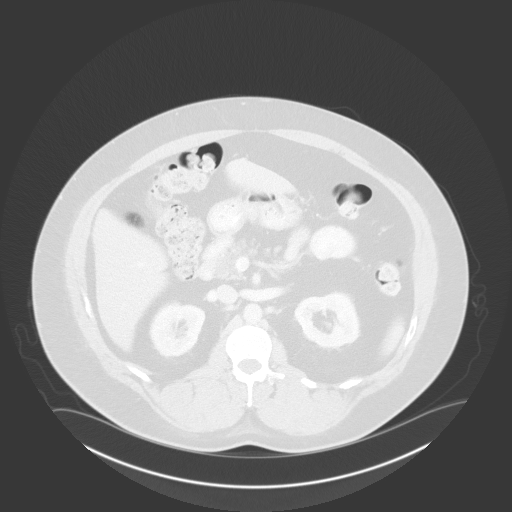
[im 85/134  lung]
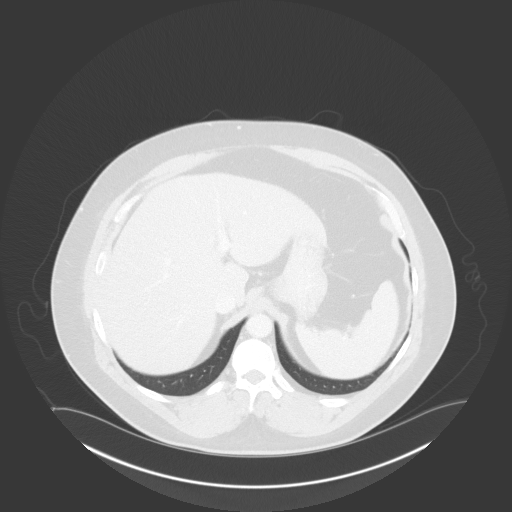
[im 97/134  lung]
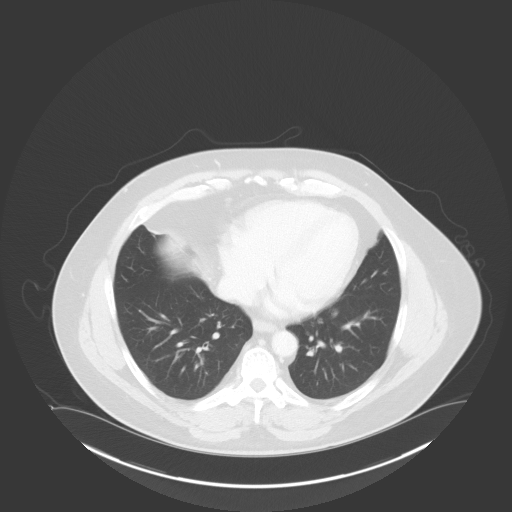
[im 109/134  mediastinal]
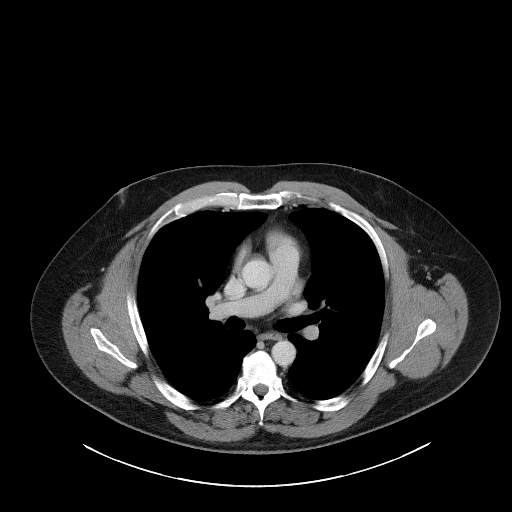
[im 109/134  lung]
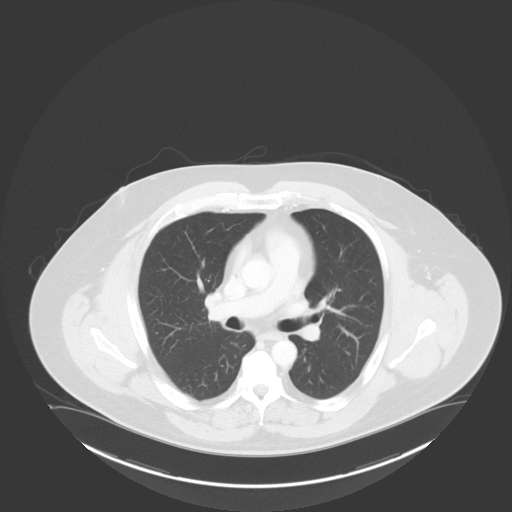
[im 121/134  lung]
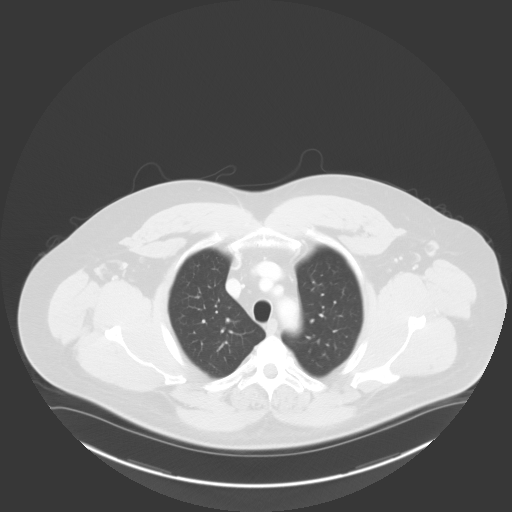

[Series 5: coronals · coronal · 0.88mm/px · 3 of 182 slices shown]
[im 37/182  lung]
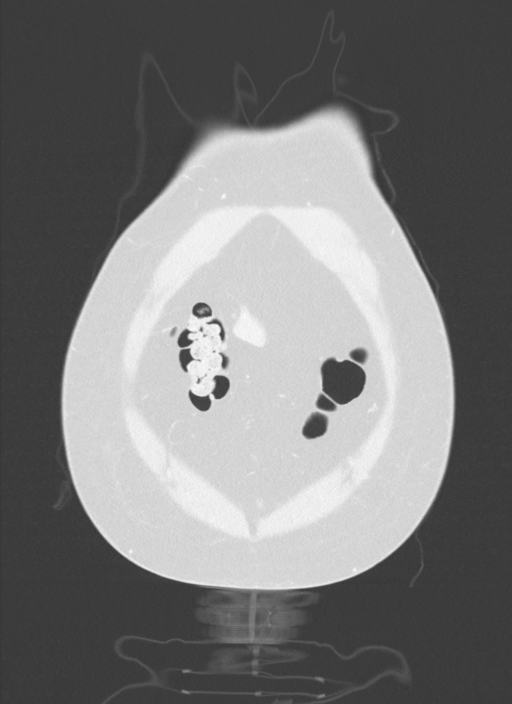
[im 73/182  lung]
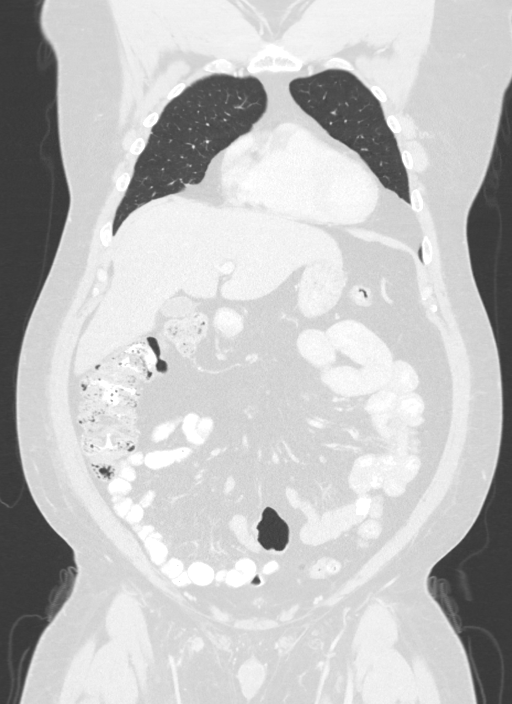
[im 109/182  lung]
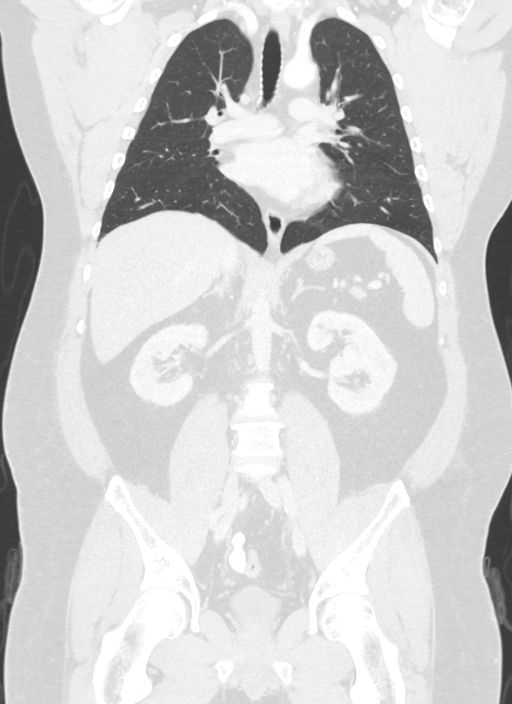

[13 of 36 positions shown; findings below may reference images not displayed]

FINDINGS: CT CHEST FINDINGS

Cardiovascular: No significant vascular findings. Normal heart size.
No pericardial effusion.

Mediastinum/Nodes: No enlarged mediastinal, hilar, or axillary lymph
nodes. Thyroid gland, trachea, and esophagus demonstrate no
significant findings.

Lungs/Pleura: Lungs are clear. 5 mm fissural nodule of the superior
segment left lower lobe (series 4, image 65). No pleural effusion or
pneumothorax.

Musculoskeletal: No chest wall mass or suspicious bone lesions
identified.

CT ABDOMEN PELVIS FINDINGS

Hepatobiliary: No solid liver abnormality is seen. No gallstones,
gallbladder wall thickening, or biliary dilatation.

Pancreas: Unremarkable. No pancreatic ductal dilatation or
surrounding inflammatory changes.

Spleen: Normal in size without significant abnormality.

Adrenals/Urinary Tract: Adrenal glands are unremarkable. Kidneys are
normal, without renal calculi, solid lesion, or hydronephrosis.
Bladder is unremarkable.

Stomach/Bowel: Stomach is within normal limits. Appendix appears
normal. No evidence of bowel wall thickening, distention, or
inflammatory changes.

Vascular/Lymphatic: Minimal aortic atherosclerosis. No enlarged
abdominal or pelvic lymph nodes.

Reproductive: No mass or other abnormality.

Other: No abdominal wall hernia or abnormality. No abdominopelvic
ascites.

Musculoskeletal: No acute or significant osseous findings.
IMPRESSION: 1. No evidence of lymphadenopathy in the chest, abdomen, or pelvis.
2. There is a 5 mm fissural nodule of the superior segment of the
left lower lobe, which is benign by morphology in the absence of
established primary malignancy. Attention on follow-up if malignancy
is diagnosed. Otherwise no routine CT follow-up is necessary.
3. Aortic Atherosclerosis ([id number]).

## 2021-10-07 ENCOUNTER — Other Ambulatory Visit: Payer: Self-pay | Admitting: Family Medicine

## 2021-10-14 ENCOUNTER — Other Ambulatory Visit: Payer: Self-pay | Admitting: Family Medicine

## 2021-10-14 DIAGNOSIS — N529 Male erectile dysfunction, unspecified: Secondary | ICD-10-CM

## 2021-10-27 NOTE — Progress Notes (Signed)
? ? ?ACUTE VISIT ?Chief Complaint  ?Patient presents with  ? Fatigue  ?  Hasn't been feeling well, would like to get testosterone checked.   ? Abdominal Pain  ?  Sternum pain, no known injury  ? ?HPI: ?[name] is a 51 y.o. male with hx of HTN,asthma,CLL,low testosterone, opioid dependency on Methadone, and sinus tachycardia here today complaining of fatigue as described above. ?A month "or so" of constant fatigue. ?Sleeps about 5-6 hours, he does not feel rested when he gets up in the morning. ?Almost falling asleep when driving. ?His wife has witnessed episodes of apnea. ?Negative for morning headache. ? ?He is on testosterone replacement, would like to have it check today. ?He is on testosterone enanthate 75 mg weekly, he has not missed any dose. ? ?ED on Viagra. ?+ Morning erections. ? ?Lab Results  ?Component Value Date  ? TESTOSTERONE 346.76 10/30/2020  ? ?Lab Results  ?Component Value Date  ? TSH 3.07 10/30/2020  ? ?CLL: Follows with oncologist and has CMP and CBC checked regularly. ?Lab Results  ?Component Value Date  ? CREATININE 1.18 07/06/2021  ? BUN 15 07/06/2021  ? NA 137 07/06/2021  ? K 4.4 07/06/2021  ? CL 102 07/06/2021  ? CO2 27 07/06/2021  ? ?Lab Results  ?Component Value Date  ? ALT 33 07/06/2021  ? AST 18 07/06/2021  ? ALKPHOS 64 07/06/2021  ? BILITOT 0.8 07/06/2021  ? ?Lab Results  ?Component Value Date  ? WBC 14.2 (H) 07/06/2021  ? HGB 15.3 07/06/2021  ? HCT 45.1 07/06/2021  ? MCV 85.4 07/06/2021  ? PLT 258 07/06/2021  ? ?Epigastric abdominal pain that started a couple days ago, it is not associated with exertion. ?Mild "soreness" with palpation and with certain movements. ?Bowel movements q 3-4 days. ?Negative for rash,nausea,vomiting,or changes in bowel habits. ?Pain is not radiated and not affected by food intake. ?He has not tried OTC mediations. ? ?Review of Systems  ?Constitutional:  Positive for activity change. Negative for appetite change.  ?HENT:  Negative for  nosebleeds, sore throat and trouble swallowing.   ?Eyes:  Negative for redness and visual disturbance.  ?Respiratory:  Negative for cough, shortness of breath and wheezing.   ?Cardiovascular:  Negative for chest pain, palpitations and leg swelling.  ?Gastrointestinal:  Negative for abdominal pain, nausea and vomiting.  ?Genitourinary:  Negative for decreased urine volume, dysuria and hematuria.  ?Neurological:  Negative for dizziness, syncope, facial asymmetry and weakness.  ?Psychiatric/Behavioral:  Negative for confusion. The patient is nervous/anxious.   ?Rest see pertinent positives and negatives per HPI. ? ?Current Outpatient Medications on File Prior to Visit  ?Medication Sig Dispense Refill  ? albuterol (VENTOLIN HFA) 108 (90 Base) MCG/ACT inhaler INHALE 1 TO 2 PUFFS INTO THE LUNGS EVERY 6 HOURS AS NEEDED FOR WHEEZING OR SHORTNESS OF BREATH 20.1 g 0  ? blood glucose meter kit and supplies KIT Dispense based on patient and insurance preference. Use up to four times daily as directed. 1 each 0  ? Blood Pressure Monitoring (COMFORT TOUCH BP CUFF/LARGE) MISC 1 each by Does not apply route daily as needed. 1 each 0  ? ketoconazole (NIZORAL) 2 % cream Apply 1 application topically daily. Apply to rash on chest 30 g 2  ? lisinopril (ZESTRIL) 40 MG tablet Take 1 tablet (40 mg total) by mouth daily. 30 tablet 0  ? Magnesium 400 MG TABS Take 400 mg by mouth daily.    ? metFORMIN (GLUCOPHAGE-XR) 500 MG 24  hr tablet Take 1 tablet (500 mg total) by mouth daily with breakfast. 90 tablet 1  ? methadone (DOLOPHINE) 10 MG/ML solution Take 85 mg by mouth daily.     ? multivitamin-iron-minerals-folic acid (CENTRUM) chewable tablet Chew 1 tablet by mouth daily.    ? Polyethylene Glycol 3350 (MIRALAX PO) Take 238 g by mouth.    ? rosuvastatin (CRESTOR) 10 MG tablet Take 1 tablet (10 mg total) by mouth daily. 90 tablet 3  ? Semaglutide,0.25 or 0.5MG/DOS, (OZEMPIC, 0.25 OR 0.5 MG/DOSE,) 2 MG/1.5ML SOPN Inject 0.5 mg into the skin  once a week. 1.5 mL 3  ? sildenafil (VIAGRA) 100 MG tablet TAKE 1/2 TABLET(50 MG) BY MOUTH DAILY AS NEEDED FOR ERECTILE DYSFUNCTION 30 tablet 0  ? Spacer/Aero-Holding Chambers (E-Z SPACER) inhaler Use as instructed 1 each 2  ? Spacer/Aero-Holding Chambers DEVI 1 Device by Does not apply route daily as needed. 1 each 0  ? tamsulosin (FLOMAX) 0.4 MG CAPS capsule Take 1 capsule (0.4 mg total) by mouth daily. 90 capsule 3  ? Testosterone Enanthate (XYOSTED) 75 MG/0.5ML SOAJ ADMINISTER 0.5 ML UNDER THE SKIN 1 TIME WEEKLY 2 mL 3  ? traZODone (DESYREL) 50 MG tablet TAKE 1 TABLET AT BEDTIME AS NEEDED FOR SLEEP 90 tablet 3  ? valACYclovir (VALTREX) 1000 MG tablet Take 2 tablets PO BID x 1 day at onset of symptoms. 30 tablet 5  ? ?No current facility-administered medications on file prior to visit.  ? ?Past Medical History:  ?Diagnosis Date  ? Asthma   ? Cancer [facility])   ? Diabetes mellitus without complication ([place])   ? GERD (gastroesophageal reflux disease)   ? Hyperlipidemia   ? Hypertension   ? Opioid abuse ([place])   ? following with methadone clinic  ? Pneumonia   ? ?No Known Allergies ? ?Social History  ? ?Socioeconomic History  ? Marital status: Married  ?  Spouse name: Not on file  ? Number of children: Not on file  ? Years of education: Not on file  ? Highest education level: Not on file  ?Occupational History  ? Not on file  ?Tobacco Use  ? Smoking status: Never  ? Smokeless tobacco: Never  ?Vaping Use  ? Vaping Use: Never used  ?Substance and Sexual Activity  ? Alcohol use: Yes  ? Drug use: Yes  ?  Types: Marijuana  ? Sexual activity: Yes  ?Other Topics Concern  ? Not on file  ?Social History Narrative  ? Not on file  ? ?Social Determinants of Health  ? ?Financial Resource Strain: Not on file  ?Food Insecurity: Not on file  ?Transportation Needs: Not on file  ?Physical Activity: Not on file  ?Stress: Not on file  ?Social Connections: Not on file  ? ? ?Vitals:  ? 10/28/21 1008  ?BP: 118/70  ?Pulse: 61  ?Resp: 16  ?SpO2:  97%  ? ?Body mass index is 39.17 kg/m?. ? ?Physical Exam ?Vitals and nursing note reviewed.  ?Constitutional:   ?   General: He is not in acute distress. ?   Appearance: He is well-developed.  ?HENT:  ?   Head: Normocephalic and atraumatic.  ?   Mouth/Throat:  ?   Mouth: Mucous membranes are moist.  ?Eyes:  ?   Conjunctiva/sclera: Conjunctivae normal.  ?Cardiovascular:  ?   Rate and Rhythm: Normal rate and regular rhythm.  ?   Heart sounds: No murmur heard. ?   Comments: Trace pitting LE edema, bilateral. ?Pulmonary:  ?  Effort: Pulmonary effort is normal. No respiratory distress.  ?   Breath sounds: Normal breath sounds.  ?Chest:  ? ? ?Abdominal:  ?   Palpations: Abdomen is soft. There is no mass.  ?   Tenderness: There is no abdominal tenderness.  ?Lymphadenopathy:  ?   Cervical: No cervical adenopathy (posterior lymphnodes palpable, bilateral.).  ?Skin: ?   General: Skin is warm.  ?   Findings: No erythema or rash.  ?Neurological:  ?   General: No focal deficit present.  ?   Mental Status: He is alert and oriented to person, place, and time.  ?   Gait: Gait normal.  ?Psychiatric:     ?   Mood and Affect: Mood is anxious.  ?   Comments: Well groomed, good eye contact.  ? ?ASSESSMENT AND PLAN: ? ?Mr.Aldrich was seen today for fatigue and abdominal pain. ? ?Diagnoses and all orders for this visit: ?Orders Placed This Encounter  ?Procedures  ? Testosterone  ? TSH  ? ?Lab Results  ?Component Value Date  ? TSH 2.74 10/28/2021  ? ?Lab Results  ?Component Value Date  ? TESTOSTERONE 141.71 (L) 10/28/2021  ? ?Fatigue, unspecified type ?We discussed possible etiologies, some of his chronic medical problems and medications can be contributing factors. ?Also his wife has witnessed some episodes of apnea while asleep, recommend addressing this with PCP, sleep study to be considered if fatigue is persistent. Wt loss will help. ?Further recommendations according to lab results. ? ?Low testosterone ?Currently on testosterone  replacement, some side effects discussed. ?Testosterone ordered today as requested. ?Continue following with PCP. ? ?Sternal pain ?Hx and examination do not suggest a serious process. ?It seems to be musculoskele

## 2021-10-28 ENCOUNTER — Ambulatory Visit (INDEPENDENT_AMBULATORY_CARE_PROVIDER_SITE_OTHER): Payer: BC Managed Care – PPO | Admitting: Family Medicine

## 2021-10-28 ENCOUNTER — Encounter: Payer: Self-pay | Admitting: Family Medicine

## 2021-10-28 VITALS — BP 118/70 | HR 61 | Resp 16 | Ht 69.0 in | Wt 265.2 lb

## 2021-10-28 DIAGNOSIS — R5383 Other fatigue: Secondary | ICD-10-CM

## 2021-10-28 DIAGNOSIS — R7989 Other specified abnormal findings of blood chemistry: Secondary | ICD-10-CM

## 2021-10-28 DIAGNOSIS — R0789 Other chest pain: Secondary | ICD-10-CM | POA: Diagnosis not present

## 2021-10-28 LAB — TESTOSTERONE: Testosterone: 141.71 ng/dL — ABNORMAL LOW (ref 300.00–890.00)

## 2021-10-28 LAB — TSH: TSH: 2.74 u[IU]/mL (ref 0.35–5.50)

## 2021-10-28 NOTE — Patient Instructions (Signed)
A few things to remember from today's visit: ? ?Low testosterone - Plan: Testosterone ? ?Fatigue, unspecified type - Plan: TSH ? ?Sternal pain ? ?If you need refills please call your pharmacy. ?Do not use My Chart to request refills or for acute issues that need immediate attention. ?  ?Abdominal pain seems to be cause by pain on tip of sternon. Monitor for new symptoms. ?Testosterone will be checked today as requested, please arrange appt with PCP in case dose needs to be adjusted. ? ?Possible sleep apnea could be causing fatigue, so if not feeling better in a few weeks you may need a sleep study. Weight loss will also help. ? ?Please be sure medication list is accurate. ?If a new problem present, please set up appointment sooner than planned today. ? ? ? ? ? ? ? ?

## 2021-11-25 ENCOUNTER — Other Ambulatory Visit: Payer: Self-pay | Admitting: Family Medicine

## 2021-11-25 MED ORDER — XYOSTED 100 MG/0.5ML ~~LOC~~ SOAJ: 100.0000 mg | SUBCUTANEOUS | 2 refills | Status: DC

## 2021-11-25 NOTE — Progress Notes (Signed)
Rx sent 

## 2021-12-01 ENCOUNTER — Other Ambulatory Visit: Payer: Self-pay | Admitting: *Deleted

## 2021-12-01 MED ORDER — OZEMPIC (0.25 OR 0.5 MG/DOSE) 2 MG/1.5ML ~~LOC~~ SOPN: 0.5000 mg | PEN_INJECTOR | SUBCUTANEOUS | 1 refills | Status: DC

## 2021-12-01 NOTE — Telephone Encounter (Signed)
Rx done. 

## 2021-12-02 ENCOUNTER — Ambulatory Visit (INDEPENDENT_AMBULATORY_CARE_PROVIDER_SITE_OTHER): Payer: BC Managed Care – PPO | Admitting: Family Medicine

## 2021-12-02 ENCOUNTER — Encounter: Payer: Self-pay | Admitting: Family Medicine

## 2021-12-02 VITALS — BP 100/60 | HR 91 | Temp 98.1°F | Ht 69.0 in | Wt 253.0 lb

## 2021-12-02 DIAGNOSIS — I10 Essential (primary) hypertension: Secondary | ICD-10-CM

## 2021-12-02 DIAGNOSIS — C9110 Chronic lymphocytic leukemia of B-cell type not having achieved remission: Secondary | ICD-10-CM | POA: Diagnosis not present

## 2021-12-02 DIAGNOSIS — R7989 Other specified abnormal findings of blood chemistry: Secondary | ICD-10-CM

## 2021-12-02 DIAGNOSIS — I499 Cardiac arrhythmia, unspecified: Secondary | ICD-10-CM

## 2021-12-02 DIAGNOSIS — E1165 Type 2 diabetes mellitus with hyperglycemia: Secondary | ICD-10-CM | POA: Diagnosis not present

## 2021-12-02 DIAGNOSIS — R0683 Snoring: Secondary | ICD-10-CM | POA: Diagnosis not present

## 2021-12-02 DIAGNOSIS — F1129 Opioid dependence with unspecified opioid-induced disorder: Secondary | ICD-10-CM

## 2021-12-02 DIAGNOSIS — I4891 Unspecified atrial fibrillation: Secondary | ICD-10-CM

## 2021-12-02 DIAGNOSIS — E119 Type 2 diabetes mellitus without complications: Secondary | ICD-10-CM | POA: Insufficient documentation

## 2021-12-02 DIAGNOSIS — E7800 Pure hypercholesterolemia, unspecified: Secondary | ICD-10-CM

## 2021-12-02 LAB — TSH: TSH: 3.61 u[IU]/mL (ref 0.35–5.50)

## 2021-12-02 LAB — COMPREHENSIVE METABOLIC PANEL
ALT: 27 U/L (ref 0–53)
AST: 27 U/L (ref 0–37)
Albumin: 4.6 g/dL (ref 3.5–5.2)
Alkaline Phosphatase: 64 U/L (ref 39–117)
BUN: 21 mg/dL (ref 6–23)
CO2: 30 mEq/L (ref 19–32)
Calcium: 9.7 mg/dL (ref 8.4–10.5)
Chloride: 100 mEq/L (ref 96–112)
Creatinine, Ser: 1.33 mg/dL (ref 0.40–1.50)
GFR: 63.24 mL/min (ref >60.00)
Glucose, Bld: 75 mg/dL (ref 70–99)
Potassium: 4.2 mEq/L (ref 3.5–5.1)
Sodium: 139 mEq/L (ref 135–145)
Total Bilirubin: 0.9 mg/dL (ref 0.2–1.2)
Total Protein: 7.1 g/dL (ref 6.0–8.3)

## 2021-12-02 LAB — CBC WITH DIFFERENTIAL/PLATELET
Basophils Absolute: 0.1 10*3/uL (ref 0.0–0.1)
Basophils Relative: 0.7 % (ref 0.0–3.0)
Eosinophils Absolute: 0.5 10*3/uL (ref 0.0–0.7)
Eosinophils Relative: 3.6 % (ref 0.0–5.0)
HCT: 46.2 % (ref 39.0–52.0)
Hemoglobin: 15.1 g/dL (ref 13.0–17.0)
Lymphocytes Relative: 55 % — ABNORMAL HIGH (ref 12.0–46.0)
Lymphs Abs: 8.1 10*3/uL — ABNORMAL HIGH (ref 0.7–4.0)
MCHC: 32.6 g/dL (ref 30.0–36.0)
MCV: 89.3 fl (ref 78.0–100.0)
Monocytes Absolute: 0.7 10*3/uL (ref 0.1–1.0)
Monocytes Relative: 5 % (ref 3.0–12.0)
Neutro Abs: 5.3 10*3/uL (ref 1.4–7.7)
Neutrophils Relative %: 35.7 % — ABNORMAL LOW (ref 43.0–77.0)
Platelets: 348 10*3/uL (ref 150.0–400.0)
RBC: 5.18 Mil/uL (ref 4.22–5.81)
RDW: 13.2 % (ref 11.5–15.5)
WBC: 14.8 10*3/uL — ABNORMAL HIGH (ref 4.0–10.5)

## 2021-12-02 LAB — LIPID PANEL
Cholesterol: 140 mg/dL (ref 0–200)
HDL: 33.7 mg/dL — ABNORMAL LOW (ref >39.00)
LDL Cholesterol: 79 mg/dL (ref 0–99)
NonHDL: 105.93
Total CHOL/HDL Ratio: 4
Triglycerides: 135 mg/dL (ref 0.0–149.0)
VLDL: 27 mg/dL (ref 0.0–40.0)

## 2021-12-02 LAB — HEMOGLOBIN A1C: Hgb A1c MFr Bld: 6 % (ref 4.6–6.5)

## 2021-12-02 LAB — PSA: PSA: 0.25 ng/mL (ref 0.10–4.00)

## 2021-12-02 LAB — TESTOSTERONE: Testosterone: 653.96 ng/dL (ref 300.00–890.00)

## 2021-12-02 MED ORDER — APIXABAN 5 MG PO TABS: 5.0000 mg | ORAL_TABLET | Freq: Two times a day (BID) | ORAL | 0 refills | Status: DC

## 2021-12-02 MED ORDER — LISINOPRIL 20 MG PO TABS: 20.0000 mg | ORAL_TABLET | Freq: Every day | ORAL | 3 refills | Status: DC

## 2021-12-02 MED ORDER — SEMAGLUTIDE (1 MG/DOSE) 4 MG/3ML ~~LOC~~ SOPN: 1.0000 mg | PEN_INJECTOR | SUBCUTANEOUS | 1 refills | Status: DC

## 2021-12-02 NOTE — Progress Notes (Signed)
?Terry [name] ?DOB: 02-23-1971 ?Encounter date: 12/02/2021 ? ?This is a 51 y.o. male who presents with ?Chief Complaint  ?Patient presents with  ? Follow-up  ? ? ?History of present illness: ? ?*when he made appointment a month or so ago, was getting sternal discomfort. This did go away and hasn't come back. No cough or shortness of breath. ?*blood pressure has been much lower than expected. We increased lisinopril to 54m at last visit.  ?*DMII: started ozempic at last visit. Doing well with this. Also going to the gym. Not eating as much. Also taking metformin 5061mxr daily. Had some indigestion when starting but now tolerates well. No issues with hypoglycemia. Eating more salads and fruits. Trying to do some intermittent fasting.  ?*HL: crestor 1054mily ?*we increased testosterone to 100m47mekly after levels were low. Energy level is ok.  ?*wanted to talk about getting sleep study. ?*due for colonoscopy. Bowel movements are not as frequent as he would like. Using miralax. Bms are about twice a week.  ?*stable on methadone - notes issues with sleep and restless legs with cut backs on dose.  ? ? ?No Known Allergies ?Current Meds  ?Medication Sig  ? albuterol (VENTOLIN HFA) 108 (90 Base) MCG/ACT inhaler INHALE 1 TO 2 PUFFS INTO THE LUNGS EVERY 6 HOURS AS NEEDED FOR WHEEZING OR SHORTNESS OF BREATH  ? apixaban (ELIQUIS) 5 MG TABS tablet Take 1 tablet (5 mg total) by mouth 2 (two) times daily.  ? blood glucose meter kit and supplies KIT Dispense based on patient and insurance preference. Use up to four times daily as directed.  ? Blood Pressure Monitoring (COMFORT TOUCH BP CUFF/LARGE) MISC 1 each by Does not apply route daily as needed.  ? ketoconazole (NIZORAL) 2 % cream Apply 1 application topically daily. Apply to rash on chest  ? Magnesium 400 MG TABS Take 400 mg by mouth daily.  ? metFORMIN (GLUCOPHAGE-XR) 500 MG 24 hr tablet Take 1 tablet (500 mg total) by mouth daily with breakfast.  ? methadone  (DOLOPHINE) 10 MG/ML solution Take 85 mg by mouth daily.   ? multivitamin-iron-minerals-folic acid (CENTRUM) chewable tablet Chew 1 tablet by mouth daily.  ? Polyethylene Glycol 3350 (MIRALAX PO) Take 238 g by mouth.  ? rosuvastatin (CRESTOR) 10 MG tablet Take 1 tablet (10 mg total) by mouth daily.  ? Semaglutide, 1 MG/DOSE, 4 MG/3ML SOPN Inject 1 mg as directed once a week.  ? sildenafil (VIAGRA) 100 MG tablet TAKE 1/2 TABLET(50 MG) BY MOUTH DAILY AS NEEDED FOR ERECTILE DYSFUNCTION  ? Spacer/Aero-Holding Chambers (E-Z SPACER) inhaler Use as instructed  ? Spacer/Aero-Holding Chambers DEVI 1 Device by Does not apply route daily as needed.  ? tamsulosin (FLOMAX) 0.4 MG CAPS capsule Take 1 capsule (0.4 mg total) by mouth daily.  ? Testosterone Enanthate (XYOSTED) 100 MG/0.5ML SOAJ Inject 100 mg into the skin once a week.  ? traZODone (DESYREL) 50 MG tablet TAKE 1 TABLET AT BEDTIME AS NEEDED FOR SLEEP  ? valACYclovir (VALTREX) 1000 MG tablet Take 2 tablets PO BID x 1 day at onset of symptoms.  ? [DISCONTINUED] lisinopril (ZESTRIL) 40 MG tablet Take 1 tablet (40 mg total) by mouth daily.  ? [DISCONTINUED] Semaglutide,0.25 or 0.5MG/DOS, (OZEMPIC, 0.25 OR 0.5 MG/DOSE,) 2 MG/1.5ML SOPN Inject 0.5 mg into the skin once a week.  ? ? ?Review of Systems  ?Constitutional:  Negative for chills, fatigue and fever.  ?Respiratory:  Negative for cough, chest tightness, shortness of breath and wheezing.   ?  Cardiovascular:  Negative for chest pain, palpitations and leg swelling.  ? ?Objective: ? ?BP 100/60 (BP Location: Left Arm, Patient Position: Sitting, Cuff Size: Large)   Pulse 91   Temp 98.1 ?F (36.7 ?C) (Oral)   Ht _0  (1.753 m)   Wt 253 lb (114.8 kg)   SpO2 97%   BMI 37.36 kg/m?   Weight: 253 lb (114.8 kg)  ? ?BP Readings from Last 3 Encounters:  ?12/02/21 100/60  ?10/28/21 118/70  ?07/06/21 (!) 149/85  ? ?Wt Readings from Last 3 Encounters:  ?12/02/21 253 lb (114.8 kg)  ?10/28/21 265 lb 4 oz (120.3 kg)  ?07/06/21 276 lb  9.6 oz (125.5 kg)  ? ? ?Physical Exam ?Constitutional:   ?   General: He is not in acute distress. ?   Appearance: He is well-developed.  ?Cardiovascular:  ?   Rate and Rhythm: Normal rate. Rhythm irregularly irregular.  ?   Heart sounds: Normal heart sounds. No murmur heard. ?  No friction rub.  ?Pulmonary:  ?   Effort: Pulmonary effort is normal. No respiratory distress.  ?   Breath sounds: Normal breath sounds. No wheezing or rales.  ?Musculoskeletal:  ?   Right lower leg: No edema.  ?   Left lower leg: No edema.  ?Neurological:  ?   Mental Status: He is alert and oriented to person, place, and time.  ?Psychiatric:     ?   Behavior: Behavior normal.  ? ? ?Assessment/Plan ? ?1. Snoring ?- Ambulatory referral to Sleep Studies ? ?2. Primary hypertension ?Well controlled, we are going to decrease back to 72m since he has been running low at home. ?- lisinopril (ZESTRIL) 20 MG tablet; Take 1 tablet (20 mg total) by mouth daily.  Dispense: 90 tablet; Refill: 3 ?- CBC with Differential/Platelet; Future ?- Comprehensive metabolic panel; Future ? ?3. Pure hypercholesterolemia ?Crestor 122mdaily. ?- Lipid panel; Future ? ?4. CLL (chronic lymphocytic leukemia) ([place]?Following with hematology/oncology. Has been stable.  ? ?5. Opioid dependence with opioid-induced disorder ([facility]?Has been stable on methadone.  ? ?6. Controlled type 2 diabetes mellitus with hyperglycemia, without long-term current use of insulin ([place]?Recheck today. Increase ozempic for weight loss benefit.  ?- Semaglutide, 1 MG/DOSE, 4 MG/3ML SOPN; Inject 1 mg as directed once a week.  Dispense: 9 mL; Refill: 1 ?- Microalbumin / creatinine urine ratio; Future ?- Hemoglobin A1c; Future ? ?7. Low testosterone in male ?Rechecking levels; understands risks with testosterone.  ?- Testosterone; Future ?- PSA; Future ? ?8. Irregular heart rate ?In atrial fib on ekg today which is new. We are going to start eliquis 15m59mID today. Refer to cardiology. He is rate  controlled.  ?- EKG 12-Lead ?- TSH; Future ?- Ambulatory referral to Cardiology ? ?9. Atrial fibrillation, unspecified type ([facility]See above. ? ?Return for pending lab or imaging results. ? ? ? ? ? ?JunMicheline RoughD ?

## 2021-12-02 NOTE — Progress Notes (Signed)
?Cardiology Office Note:   ? ?Date:  12/03/2021  ? ?ID:  Terry [name], DOB 03-16-1971, MRN [id number] ? ?PCP:  [name], MD  ? ?[facility] Providers ?Cardiologist:  Lenna Sciara, MD ?Referring MD: [name], MD  ? ?Chief Complaint/Reason for Referral: Atrial fibrillation ? ?ASSESSMENT:   ? ?1. Atrial fibrillation, unspecified type ([place])   ?2. Aortic atherosclerosis ([place])   ?3. Type 2 diabetes mellitus with complication, without long-term current use of insulin ([place])   ?4. Hypertension associated with diabetes ([place])   ?5. Hyperlipidemia associated with type 2 diabetes mellitus ([place])   ?6. CLL (chronic lymphocytic leukemia) ([place])   ?7. BMI 37.0-37.9, adult   ? ? ?PLAN:   ? ?In order of problems listed above: ?1.  Atrial fibrillation: Given patient's CHA2DS2-VASc score of 2 anticoagulation is recommended.  Continue with 5 mg twice daily.  We will obtain an echocardiogram.  Follow-up 1 year or earlier if needed. ?2.  Aortic atherosclerosis: Continue Eliquis in lieu of aspirin and rosuvastatin with strict blood pressure control. ?3.  Type 2 diabetes: Continue Eliquis in lieu of aspirin, lisinopril, rosuvastatin, and consider SGLT2 inhibitor. ?4.  Hypertension: Continue lisinopril with goal blood pressure less than 130/80 mmHg. ?5.  Hyperlipidemia: Continue rosuvastatin with goal LDL less than 70 given patient is a diabetic ?6.  Chronic lymphocytic leukemia: This is being followed by oncology. ?7.  Elevated BMI: On ozempic. ? ? ?     ? ?   ? ?Dispo:  Return in about 1 year (around 12/04/2022).  ? ?  ? ?Medication Adjustments/Labs and Tests Ordered: ?Current medicines are reviewed at length with the patient today.  Concerns regarding medicines are outlined above. ? ?The following changes have been made:  no change  ? ?Labs/tests ordered: ?No orders of the defined types were placed in this encounter. ? ? ?Medication Changes: ?No orders of the defined types were placed in this  encounter. ? ? ? ?Current medicines are reviewed at length with the patient today.  The patient does not have concerns regarding medicines. ? ? ?History of Present Illness:   ? ?FOCUSED PROBLEM LIST:   ?1.  Atrial fibrillation on anticoagulation; CHADS2-VASC 2 ?2.  Type 2 diabetes on oral medication ?3.  Hypertension ?4.  Hyperlipidemia ?5.  Substance abuse ?6.  Chronic lymphocytic leukemia ? ?The patient is a 51 y.o. male with the indicated medical history here for for recommendations regarding atrial fibrillation.  He was seen in his PCPs office and an EKG which I reviewed demonstrated rate controlled atrial fibrillation.  He was started on Eliquis and referred for further recommendations. ? ?The patient is here with his wife.  He has been doing very well.  He denies any palpitations, paroxysmal nocturnal dyspnea, orthopnea, presyncope, or syncope.  He works full-time as a Freight forwarder and denies any exertional angina or dyspnea.  He is required no hospitalizations or emergency room visits.  He does not smoke cigarettes but does use marijuana on occasion.  He is otherwise well and without complaints. ? ?Current Medications: ?Current Meds  ?Medication Sig  ? albuterol (VENTOLIN HFA) 108 (90 Base) MCG/ACT inhaler INHALE 1 TO 2 PUFFS INTO THE LUNGS EVERY 6 HOURS AS NEEDED FOR WHEEZING OR SHORTNESS OF BREATH  ? amoxicillin (AMOXIL) 500 MG tablet Take 500 mg by mouth every 4 (four) hours.  ? apixaban (ELIQUIS) 5 MG TABS tablet Take 1 tablet (5 mg total) by mouth 2 (two) times daily.  ? blood glucose  meter kit and supplies KIT Dispense based on patient and insurance preference. Use up to four times daily as directed.  ? Blood Pressure Monitoring (COMFORT TOUCH BP CUFF/LARGE) MISC 1 each by Does not apply route daily as needed.  ? ketoconazole (NIZORAL) 2 % cream Apply 1 application topically daily. Apply to rash on chest  ? lisinopril (ZESTRIL) 20 MG tablet Take 1 tablet (20 mg total) by mouth daily.  ? Magnesium  400 MG TABS Take 400 mg by mouth daily.  ? metFORMIN (GLUCOPHAGE-XR) 500 MG 24 hr tablet Take 1 tablet (500 mg total) by mouth daily with breakfast.  ? methadone (DOLOPHINE) 10 MG/ML solution Take 85 mg by mouth daily.   ? multivitamin-iron-minerals-folic acid (CENTRUM) chewable tablet Chew 1 tablet by mouth daily.  ? Polyethylene Glycol 3350 (MIRALAX PO) Take 238 g by mouth.  ? rosuvastatin (CRESTOR) 10 MG tablet Take 1 tablet (10 mg total) by mouth daily.  ? Semaglutide, 1 MG/DOSE, 4 MG/3ML SOPN Inject 1 mg as directed once a week.  ? Spacer/Aero-Holding Chambers (E-Z SPACER) inhaler Use as instructed  ? Spacer/Aero-Holding Chambers DEVI 1 Device by Does not apply route daily as needed.  ? tamsulosin (FLOMAX) 0.4 MG CAPS capsule Take 1 capsule (0.4 mg total) by mouth daily.  ? Testosterone Enanthate (XYOSTED) 100 MG/0.5ML SOAJ Inject 100 mg into the skin once a week.  ? valACYclovir (VALTREX) 1000 MG tablet Take 2 tablets PO BID x 1 day at onset of symptoms.  ?  ? ?Allergies:    ?Patient has no known allergies.  ? ?Social History:   ?Social History  ? ?Tobacco Use  ? Smoking status: Never  ? Smokeless tobacco: Never  ?Vaping Use  ? Vaping Use: Never used  ?Substance Use Topics  ? Alcohol use: Yes  ? Drug use: Yes  ?  Types: Marijuana  ?  ? ?Family Hx: ?Family History  ?Problem Relation Age of Onset  ? Diabetes Mother   ? AAA (abdominal aortic aneurysm) Mother 24  ? Blindness Mother   ? Diabetes Father   ? Stroke Father 46  ? Heart attack Father   ? Drug abuse Sister   ? Drug abuse Brother   ? Brain cancer Maternal Grandmother 60  ? Diabetes Paternal Grandmother   ? Stroke Paternal Grandmother   ? Diabetes Paternal Grandfather 66  ? Colon cancer Neg Hx   ? Colon polyps Neg Hx   ? Esophageal cancer Neg Hx   ? Stomach cancer Neg Hx   ? Rectal cancer Neg Hx   ?  ? ?Review of Systems:   ?Please see the history of present illness.    ?All other systems reviewed and are negative. ?  ? ? ?EKGs/Labs/Other Test Reviewed:    ? ?EKG:  EKG performed Dec 02, 2021 that I personally reviewed demonstrates atrial fibrillation. ? ?Prior CV studies: ? ?TTE 2022 demonstrates ejection fraction of 65 to 70% with grade 1 diastolic dysfunction and no significant valvular abnormalities ? ?Other studies Reviewed: CT abdomen pelvis 2021 demonstrates aortic atherosclerosis ? ? ?Recent Labs: ?12/02/2021: ALT 27; BUN 21; Creatinine, Ser 1.33; Hemoglobin 15.1; Platelets 348.0; Potassium 4.2; Sodium 139; TSH 3.61  ? ?Recent Lipid Panel ?Lab Results  ?Component Value Date/Time  ? CHOL 140 12/02/2021 10:34 AM  ? TRIG 135.0 12/02/2021 10:34 AM  ? HDL 33.70 (L) 12/02/2021 10:34 AM  ? LDLCALC 79 12/02/2021 10:34 AM  ? LDLDIRECT 132.0 07/11/2019 09:09 AM  ? ? ?Risk Assessment/Calculations:   ? ? ?  CHA2DS2-VASc Score = 2  ? This indicates a 2.2% annual risk of stroke. ?The patient's score is based upon: ?CHF History: 0 ?HTN History: 1 ?Diabetes History: 1 ?Stroke History: 0 ?Vascular Disease History: 0 ?Age Score: 0 ?Gender Score: 0 ?  ? ?    ? ?Physical Exam:   ? ?VS:  BP 118/80   Pulse (!) 57   Ht $R'5\' 9"'YR$  (1.753 m)   Wt 253 lb (114.8 kg)   SpO2 97%   BMI 37.36 kg/m?    ?Wt Readings from Last 3 Encounters:  ?12/03/21 253 lb (114.8 kg)  ?12/02/21 253 lb (114.8 kg)  ?10/28/21 265 lb 4 oz (120.3 kg)  ?  ?GENERAL:  No apparent distress, AOx3 ?HEENT:  No carotid bruits, +2 carotid impulses, no scleral icterus ?CAR: RRR no murmurs, gallops, rubs, or thrills ?RES:  Clear to auscultation bilaterally ?ABD:  Soft, nontender, nondistended, positive bowel sounds x 4 ?VASC:  +2 radial pulses, +2 carotid pulses, palpable pedal pulses ?NEURO:  CN 2-12 grossly intact; motor and sensory grossly intact ?PSYCH:  No active depression or anxiety ?EXT:  No edema, ecchymosis, or cyanosis ? ?Signed, ?Early Osmond, MD  ?12/03/2021 2:14 PM    ?[place] ?[place], [address],   [address] ?Phone: [phone]; Fax: [phone]  ? ?Note:  This document  was prepared using Dragon voice recognition software and may include unintentional  ?

## 2021-12-02 NOTE — Addendum Note (Signed)
Addended by: Rosalyn Gess D on: 12/02/2021 10:43 AM ? ? Modules accepted: Orders ? ?

## 2021-12-02 NOTE — Patient Instructions (Addendum)
See about establishing care with Dr. Elease Hashimoto, Dr.Michael, or Dr. Jerilee Hoh ? ?Call [facility] to set up colonoscopy: [phone]. ?

## 2021-12-03 ENCOUNTER — Encounter: Payer: Self-pay | Admitting: Internal Medicine

## 2021-12-03 ENCOUNTER — Ambulatory Visit (INDEPENDENT_AMBULATORY_CARE_PROVIDER_SITE_OTHER): Payer: BC Managed Care – PPO | Admitting: Internal Medicine

## 2021-12-03 VITALS — BP 118/80 | HR 57 | Ht 69.0 in | Wt 253.0 lb

## 2021-12-03 DIAGNOSIS — E118 Type 2 diabetes mellitus with unspecified complications: Secondary | ICD-10-CM | POA: Diagnosis not present

## 2021-12-03 DIAGNOSIS — I700 Atherosclerosis of aorta: Secondary | ICD-10-CM | POA: Diagnosis not present

## 2021-12-03 DIAGNOSIS — E1159 Type 2 diabetes mellitus with other circulatory complications: Secondary | ICD-10-CM | POA: Diagnosis not present

## 2021-12-03 DIAGNOSIS — Z6837 Body mass index (BMI) 37.0-37.9, adult: Secondary | ICD-10-CM

## 2021-12-03 DIAGNOSIS — I4891 Unspecified atrial fibrillation: Secondary | ICD-10-CM

## 2021-12-03 DIAGNOSIS — I152 Hypertension secondary to endocrine disorders: Secondary | ICD-10-CM

## 2021-12-03 DIAGNOSIS — C9110 Chronic lymphocytic leukemia of B-cell type not having achieved remission: Secondary | ICD-10-CM

## 2021-12-03 DIAGNOSIS — E1169 Type 2 diabetes mellitus with other specified complication: Secondary | ICD-10-CM

## 2021-12-03 DIAGNOSIS — E785 Hyperlipidemia, unspecified: Secondary | ICD-10-CM

## 2021-12-03 NOTE — Patient Instructions (Signed)
Medication Instructions:  ?Your physician recommends that you continue on your current medications as directed. Please refer to the Current Medication list given to you today. ? ?*If you need a refill on your cardiac medications before your next appointment, please call your pharmacy* ? ? ?Lab Work: ?None ordered ? ?If you have labs (blood work) drawn today and your tests are completely normal, you will receive your results only by: ?MyChart Message (if you have MyChart) OR ?A paper copy in the mail ?If you have any lab test that is abnormal or we need to change your treatment, we will call you to review the results. ? ? ?Testing/Procedures: ?Your physician has requested that you have an echocardiogram. Echocardiography is a painless test that uses sound waves to create images of your heart. It provides your doctor with information about the size and shape of your heart and how well your heart?s chambers and valves are working. This procedure takes approximately one hour. There are no restrictions for this procedure. ? ? ? ?Follow-Up: ?At [facility], you and your health needs are our priority.  As part of our continuing mission to provide you with exceptional heart care, we have created designated Provider Care Teams.  These Care Teams include your primary Cardiologist (physician) and Advanced Practice Providers (APPs -  Physician Assistants and Nurse Practitioners) who all work together to provide you with the care you need, when you need it. ? ?We recommend signing up for the patient portal called "MyChart".  Sign up information is provided on this After Visit Summary.  MyChart is used to connect with patients for Virtual Visits (Telemedicine).  Patients are able to view lab/test results, encounter notes, upcoming appointments, etc.  Non-urgent messages can be sent to your provider as well.   ?To learn more about what you can do with MyChart, go to [email or web address].   ? ?Your next appointment:   ?12  month(s) ? ?The format for your next appointment:   ?In Person ? ?Provider:   ?Dr. Lenna Sciara ? ? ?Other Instructions ? ? ?Important Information About Sugar ? ? ? ? ?  ?

## 2021-12-08 ENCOUNTER — Other Ambulatory Visit: Payer: Self-pay | Admitting: Family Medicine

## 2021-12-08 DIAGNOSIS — R0602 Shortness of breath: Secondary | ICD-10-CM

## 2021-12-11 ENCOUNTER — Ambulatory Visit (INDEPENDENT_AMBULATORY_CARE_PROVIDER_SITE_OTHER): Payer: BC Managed Care – PPO | Admitting: Family Medicine

## 2021-12-11 ENCOUNTER — Encounter: Payer: Self-pay | Admitting: Family Medicine

## 2021-12-11 ENCOUNTER — Telehealth: Payer: Self-pay | Admitting: Internal Medicine

## 2021-12-11 VITALS — BP 118/72 | HR 80 | Temp 98.0°F | Ht 69.0 in | Wt 245.4 lb

## 2021-12-11 DIAGNOSIS — R1013 Epigastric pain: Secondary | ICD-10-CM

## 2021-12-11 MED ORDER — APIXABAN 5 MG PO TABS
5.0000 mg | ORAL_TABLET | Freq: Two times a day (BID) | ORAL | 5 refills | Status: DC
Start: 2021-12-11 — End: 2022-08-13

## 2021-12-11 NOTE — Telephone Encounter (Signed)
Afib indication, age 51, weight 111kg, SCr 1.33 on 12/02/21. Pt on correct dose of Eliquis, refill sent in. ?

## 2021-12-11 NOTE — Patient Instructions (Signed)
Consider trial of over the counter medication such as Nexium 20 mg once daily or Prilosec 20 mg daily ? ?I will be setting up upper abdominal imaging to further assess.  ?

## 2021-12-11 NOTE — Telephone Encounter (Signed)
?*  STAT* If patient is at the pharmacy, call can be transferred to refill team. ? ? ?1. Which medications need to be refilled? (please list name of each medication and dose if known)  ?apixaban (ELIQUIS) 5 MG TABS tablet ? ?2. Which pharmacy/location (including street and city if local pharmacy) is medication to be sent to? ?[id number] - [place], [address] ? ?3. Do they need a 30 day or 90 day supply? 30 with refills ? ?Patient will need Dr. Ali Lowe to write the medications now instead of his PCP ?

## 2021-12-11 NOTE — Progress Notes (Signed)
? ?Established Patient Office Visit ? ?Subjective   ?Patient ID: Terry [name], male    DOB: 1971-03-25  Age: 51 y.o. MRN: [id number] ? ?Chief Complaint  ?Patient presents with  ? Abdominal Pain  ?  Patient complains of stomach pain, x1 week   ? ? ?HPI ? ? ?Seen with approximately 1 month history of epigastric abdominal pain.  His past medical history is significant for type 2 diabetes, hypertension, restless leg syndrome, hyperlipidemia, CLL.  Recently diagnosed with atrial fibrillation.  He is on Eliquis.  No recent exertional chest pain.  Pain is actually very poorly localized and upper abdominal.  Was seen here on the 29th for this. ? ?Does have some decreased appetite and has had some recent weight loss but was started few months ago on Ozempic and doing very well with that.  He does have some early satiety, as expected.  Current pain is not related to eating.  Symptoms are intermittent.  Sometimes worse with bending over.  No melena.  No history of ulcers.  No nonsteroidal use.  No nausea or vomiting.  No history of pancreatitis. ? ?Past Medical History:  ?Diagnosis Date  ? Asthma   ? [facility])   ? Diabetes mellitus without complication ([place])   ? GERD (gastroesophageal reflux disease)   ? Hyperlipidemia   ? Hypertension   ? Opioid abuse ([place])   ? following with methadone clinic  ? Pneumonia   ? ?Past Surgical History:  ?Procedure Laterality Date  ? right bicep Right 2013  ? TRICEPS TENDON REPAIR Left 04/2013  ? ? reports that he has never smoked. He has never used smokeless tobacco. He reports current alcohol use. He reports current drug use. Drug: Marijuana. ?family history includes AAA (abdominal aortic aneurysm) (age of onset: 33) in his mother; Blindness in his mother; Brain cancer (age of onset: 30) in his maternal grandmother; Diabetes in his father, mother, and paternal grandmother; Diabetes (age of onset: 7) in his paternal grandfather; Drug abuse in his brother and sister; Heart attack in  his father; Stroke in his paternal grandmother; Stroke (age of onset: 64) in his father. ?No Known Allergies ? ?Review of Systems  ?Constitutional:  Positive for weight loss. Negative for chills and fever.  ?Respiratory:  Negative for cough, shortness of breath and wheezing.   ?Cardiovascular:  Negative for chest pain.  ?Gastrointestinal:  Positive for abdominal pain. Negative for blood in stool, constipation, diarrhea, heartburn, melena, nausea and vomiting.  ?Genitourinary:  Negative for dysuria.  ? ?  ?Objective:  ?  ? ?BP 118/72 (BP Location: Left Arm, Patient Position: Sitting, Cuff Size: Normal)   Pulse 80   Temp 98 ?F (36.7 ?C) (Oral)   Ht '5\' 9"'$  (1.753 m)   Wt 245 lb 6.4 oz (111.3 kg)   SpO2 97%   BMI 36.24 kg/m?  ? ? ?Physical Exam ?Vitals reviewed.  ?Constitutional:   ?   General: He is not in acute distress. ?   Appearance: He is well-developed. He is not ill-appearing.  ?Cardiovascular:  ?   Rate and Rhythm: Normal rate and regular rhythm.  ?Pulmonary:  ?   Effort: Pulmonary effort is normal.  ?   Breath sounds: Normal breath sounds.  ?Abdominal:  ?   General: Abdomen is flat. Bowel sounds are normal.  ?   Palpations: Abdomen is soft.  ?   Comments: Minimal epigastric tenderness to deep palpation.  No guarding or rebound.  No right upper quadrant tenderness.  ?  Neurological:  ?   Mental Status: He is alert.  ? ? ? ?No results found for any visits on 12/11/21. ? ? ? ?The 10-year ASCVD risk score (Arnett DK, et al., 2019) is: 4.9% ? ?  ?Assessment & Plan:  ? ?1 month history of poorly localized epigastric pain.  No nausea or vomiting suggest likely pancreatitis.  No exertional component.  Does not have any red flags such as hematemesis, melena, etc.  He is losing weight but was started on Ozempic recently.  He feels like current pain is more than related to just the early satiety from [place].  Had multiple recent labs which were unremarkable and these were reviewed. ? ?-Consider trial of  over-the-counter PPI with Nexium or Prilosec 20 mg daily ?-Set up upper GI to further assess ? ?No follow-ups on file.  ? ? ?Carolann Littler, MD ? ?

## 2021-12-15 ENCOUNTER — Encounter: Payer: Self-pay | Admitting: Neurology

## 2021-12-15 ENCOUNTER — Ambulatory Visit (INDEPENDENT_AMBULATORY_CARE_PROVIDER_SITE_OTHER): Payer: BC Managed Care – PPO | Admitting: Neurology

## 2021-12-15 VITALS — BP 108/63 | HR 59 | Ht 69.0 in | Wt 244.5 lb

## 2021-12-15 DIAGNOSIS — E661 Drug-induced obesity: Secondary | ICD-10-CM | POA: Insufficient documentation

## 2021-12-15 DIAGNOSIS — G4726 Circadian rhythm sleep disorder, shift work type: Secondary | ICD-10-CM | POA: Insufficient documentation

## 2021-12-15 DIAGNOSIS — I480 Paroxysmal atrial fibrillation: Secondary | ICD-10-CM | POA: Diagnosis not present

## 2021-12-15 DIAGNOSIS — C9110 Chronic lymphocytic leukemia of B-cell type not having achieved remission: Secondary | ICD-10-CM | POA: Diagnosis not present

## 2021-12-15 DIAGNOSIS — E119 Type 2 diabetes mellitus without complications: Secondary | ICD-10-CM | POA: Insufficient documentation

## 2021-12-15 DIAGNOSIS — Z6836 Body mass index (BMI) 36.0-36.9, adult: Secondary | ICD-10-CM

## 2021-12-15 DIAGNOSIS — G478 Other sleep disorders: Secondary | ICD-10-CM | POA: Insufficient documentation

## 2021-12-15 DIAGNOSIS — Z9189 Other specified personal risk factors, not elsewhere classified: Secondary | ICD-10-CM | POA: Diagnosis not present

## 2021-12-15 NOTE — Patient Instructions (Signed)
Quality Sleep Information, Adult ?Quality sleep is important for your mental and physical health. It also improves your quality of life. Quality sleep means you: ?Are asleep for most of the time you are in bed. ?Fall asleep within 30 minutes. ?Wake up no more than once a night.  ?Are awake for no longer than 20 minutes if you do wake up during the night. ?Most adults need 7-8 hours of quality sleep each night. ?How can poor sleep affect me? ?If you do not get enough quality sleep, you may have: ?Mood swings. ?Daytime sleepiness. ?Confusion. ?Decreased reaction time. ?Sleep disorders, such as insomnia and sleep apnea. ?Difficulty with: ?Solving problems. ?Coping with stress. ?Paying attention. ?These issues may affect your performance and productivity at work, school, and at home. Lack of sleep may also put you at higher risk for accidents, suicide, and risky behaviors. ?If you do not get quality sleep you may also be at higher risk for several health problems, including: ?Infections. ?Type 2 diabetes. ?Heart disease. ?High blood pressure. ?Obesity. ?Worsening of long-term conditions, like arthritis, kidney disease, depression, Parkinson's disease, and epilepsy. ?What actions can I take to get more quality sleep? ? ?  ? ?Stick to a sleep schedule. Go to sleep and wake up at about the same time each day. Do not try to sleep less on weekdays and make up for lost sleep on weekends. This does not work. ?Try to get about 30 minutes of exercise on most days. Do not exercise 2-3 hours before going to bed. ?Limit naps during the day to 30 minutes or less. ?Do not use any products that contain nicotine or tobacco, such as cigarettes or e-cigarettes. If you need help quitting, ask your health care provider. ?Do not drink caffeinated beverages for at least 8 hours before going to bed. Coffee, tea, and some sodas contain caffeine. ?Do not drink alcohol close to bedtime. ?Do not eat large meals close to bedtime. ?Do not take naps  in the late afternoon. ?Try to get at least 30 minutes of sunlight every day. Morning sunlight is best. ?Make time to relax before bed. Reading, listening to music, or taking a hot bath promotes quality sleep. ?Make your bedroom a place that promotes quality sleep. Keep your bedroom dark, quiet, and at a comfortable room temperature. Make sure your bed is comfortable. Take out sleep distractions like TV, a computer, smartphone, and bright lights. ?If you are lying awake in bed for longer than 20 minutes, get up and do a relaxing activity until you feel sleepy. ?Work with your health care provider to treat medical conditions that may affect sleeping, such as: ?Nasal obstruction. ?Snoring. ?Sleep apnea and other sleep disorders. ?Talk to your health care provider if you think any of your prescription medicines may cause you to have difficulty falling or staying asleep. ?If you have sleep problems, talk with a sleep consultant. If you think you have a sleep disorder, talk with your health care provider about getting evaluated by a specialist. ?Where to find more information ?National Sleep Foundation website: https://sleepfoundation.org ?National Heart, Lung, and Blood Institute (NHLBI): www.nhlbi.nih.gov/files/docs/public/sleep/healthy_sleep.pdf ?Centers for Disease Control and Prevention (CDC): www.cdc.gov/sleep/index.html ?Contact a health care provider if you: ?Have trouble getting to sleep or staying asleep. ?Often wake up very early in the morning and cannot get back to sleep. ?Have daytime sleepiness. ?Have daytime sleep attacks of suddenly falling asleep and sudden muscle weakness (narcolepsy). ?Have a tingling sensation in your legs with a strong urge to move your   legs (restless legs syndrome). ?Stop breathing briefly during sleep (sleep apnea). ?Think you have a sleep disorder or are taking a medicine that is affecting your quality of sleep. ?Summary ?Most adults need 7-8 hours of quality sleep each  night. ?Getting enough quality sleep is an important part of health and well-being. ?Make your bedroom a place that promotes quality sleep and avoid things that may cause you to have poor sleep, such as alcohol, caffeine, smoking, and large meals. ?Talk to your health care provider if you have trouble falling asleep or staying asleep. ?This information is not intended to replace advice given to you by your health care provider. Make sure you discuss any questions you have with your health care provider. ?Document Revised: 08/24/2021 Document Reviewed: 05/18/2021 ?Elsevier Patient Education ? [place]. ?Sleep Apnea ?Sleep apnea affects breathing during sleep. It causes breathing to stop for 10 seconds or more, or to become shallow. People with sleep apnea usually snore loudly. ?It can also increase the risk of: ?Heart attack. ?Stroke. ?Being very overweight (obese). ?Diabetes. ?Heart failure. ?Irregular heartbeat. ?High blood pressure. ?The goal of treatment is to help you breathe normally again. ?What are the causes? ? ?The most common cause of this condition is a collapsed or blocked airway. ?There are three kinds of sleep apnea: ?Obstructive sleep apnea. This is caused by a blocked or collapsed airway. ?Central sleep apnea. This happens when the brain does not send the right signals to the muscles that control breathing. ?Mixed sleep apnea. This is a combination of obstructive and central sleep apnea. ?What increases the risk? ?Being overweight. ?Smoking. ?Having a small airway. ?Being older. ?Being male. ?Drinking alcohol. ?Taking medicines to calm yourself (sedatives or tranquilizers). ?Having family members with the condition. ?Having a tongue or tonsils that are larger than normal. ?What are the signs or symptoms? ?Trouble staying asleep. ?Loud snoring. ?Headaches in the morning. ?Waking up gasping. ?Dry mouth or sore throat in the morning. ?Being sleepy or tired during the day. ?If you are sleepy or  tired during the day, you may also: ?Not be able to focus your mind (concentrate). ?Forget things. ?Get angry a lot and have mood swings. ?Feel sad (depressed). ?Have changes in your personality. ?Have less interest in sex, if you are male. ?Be unable to have an erection, if you are male. ?How is this treated? ? ?Sleeping on your side. ?Using a medicine to get rid of mucus in your nose (decongestant). ?Avoiding the use of alcohol, medicines to help you relax, or certain pain medicines (narcotics). ?Losing weight, if needed. ?Changing your diet. ?Quitting smoking. ?Using a machine to open your airway while you sleep, such as: ?An oral appliance. This is a mouthpiece that shifts your lower jaw forward. ?A CPAP device. This device blows air through a mask when you breathe out (exhale). ?An EPAP device. This has valves that you put in each nostril. ?A BIPAP device. This device blows air through a mask when you breathe in (inhale) and breathe out. ?Having surgery if other treatments do not work. ?Follow these instructions at home: ?Lifestyle ?Make changes that your doctor recommends. ?Eat a healthy diet. ?Lose weight if needed. ?Avoid alcohol, medicines to help you relax, and some pain medicines. ?Do not smoke or use any products that contain nicotine or tobacco. If you need help quitting, ask your doctor. ?General instructions ?Take over-the-counter and prescription medicines only as told by your doctor. ?If you were given a machine to use while you sleep,  use it only as told by your doctor. ?If you are having surgery, make sure to tell your doctor you have sleep apnea. You may need to bring your device with you. ?Keep all follow-up visits. ?Contact a doctor if: ?The machine that you were given to use during sleep bothers you or does not seem to be working. ?You do not get better. ?You get worse. ?Get help right away if: ?Your chest hurts. ?You have trouble breathing in enough air. ?You have an uncomfortable feeling  in your back, arms, or stomach. ?You have trouble talking. ?One side of your body feels weak. ?A part of your face is hanging down. ?These symptoms may be an emergency. Get help right away. Call your local eme

## 2021-12-15 NOTE — Progress Notes (Addendum)
? ? ?SLEEP MEDICINE CLINIC ?  ? ?Provider:  Larey Seat, MD  ?Primary Care Physician:  [name], MD ?[facility] ?[place] [address]  ? ?  ?Referring Provider: [name], Md ?[facility] ?[place],  [place] [address]  ?  ?  ?    ?Chief Complaint according to patient   ?Patient presents with:  ?  ? New Patient (Initial Visit)  ?    Pt referred for sleep consult due to snoring, afib, morbid obesity and HTN. GERD. CLL dx in 2020.  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  ?Tries to get 8hrs of sleep but normally gets 5-6 hrs when working. Following a changing shift schedule the coming weeks are hard-  ?Weekends are better , more sleep . Sleeps in a recliner for the last 4-5 years.   ?  ?  ?HISTORY OF PRESENT ILLNESS:  ?Terry [name] is a 51 -year- old  male patient with caucasian mother and Leretha Dykes father seen here upon referral on 12/15/2021 from Dr. Ethlyn Gallery, MD.  ? ?Chief concern according to patient :  poorer sleep quality, un-refreshing sleep, wife has witnessed apnea. ?  ?I have the pleasure of seeing Terry [name] today, a right -handed Other or two or more races male with a possible sleep disorder. He  has a past medical history of Asthma, atrial fibrillation,  CLL/ leukemia Cancer ([place]), Diabetes mellitus without complication ([place]), GERD (gastroesophageal reflux disease), Hyperlipidemia, Hypertension, Opioid abuse ([place]), and Pneumonia. ?   ?Sleep relevant medical history: Nocturia until 6 months ago, now not as much on Ozempic, wisdom teeth removed.   ? Family medical /sleep history: No other family member on CPAP with OSA, insomnia, sleep walkers.  ?Parents have passed: Mother died of COVID 2020, father of Stroke, MI and CAD. Both had DM.  ?  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  and lives in a household with  spouse , with one of 3 children living at home. 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 ?Tobacco use- never .  ? ETOH use : seldomly, ? Caffeine intake in form of Coffee( /) Soda( 7 cans a day, cutting back now) Tea ( /) or energy drinks. ?Regular exercise in form of walking. Gym after work.  .   ?  ?  ?Sleep habits are as follows: The patient's dinner time is between on day shift at 8 PM. On nightshift- eating at 10 Pm at work. He will then eat a meal just after coming home in AM and goes to sleep by 8.30 AM. ?His daytime sleep is confusing to him, he gets 5 or ore hours of sleep.  ? The preferred sleep position is supine in a recliner -, with the support of 0 pillows.  ?Dreams are reportedly frequent/vivid.  ?6 AM  AM is the usual rise time for the day shift. The patient wakes up spontaneously.  ?He reports not feeling refreshed or restored in AM, with symptoms such as dry mouth, sinus  pressure, morning headaches, and residual fatigue. He can't breathe through his nose.  ?Naps are taken frequently when off work , lasting from  to 3-5 hours.   ? ?Review of Systems: ?Out of a complete 14 system review, the patient complains of only  the following symptoms, and all other reviewed systems are negative.:  ?Fatigue, sleepiness , snoring, fragmented sleep,  shift worker.  CLL may contribute to fatigue. GERD helped by sleeping reclined. Loud snorer with nasal congestion.  ?Ozempic has let to weight los and less nocturia.   ?  ?How likely are you to doze in the following situations: ?0 = not likely, 1 = slight chance, 2 = moderate chance, 3 = high chance ?  ?Sitting and Reading? ?Watching Television? ?Sitting inactive in a public place (theater or meeting)? ?As a passenger in a car for an hour without a break? ?Lying down in the afternoon when circumstances permit? ?Sitting and talking to someone? ?Sitting quietly after lunch without alcohol? ?In a  car, while stopped for a few minutes in traffic? ?  ?Total = 18/ 24 points  ? FSS endorsed at 45/ 63 points.  ? ?Social History  ? ?Socioeconomic History  ? Marital status: Married  ?  Spouse name: Chrystal  ? Number of children: 3  ? Years of education: Not on file  ? Highest education level: 9th grade  ?Occupational History  ? Not on file  ?Tobacco Use  ? Smoking status: Never  ? Smokeless tobacco: Never  ?Vaping Use  ? Vaping Use: Never used  ?Substance and Sexual Activity  ? Alcohol use: Yes  ? Drug use: Yes  ?  Types: Marijuana  ? Sexual activity: Yes  ?Other Topics Concern  ? Not on file  ?Social History Narrative  ? Lives at home with wife and children  ? R handed  ? Caffeine: 7 cans of diet pepsi a day.   ? ?Social Determinants of Health  ? ?Financial Resource Strain: Not on file  ?Food Insecurity: Not on file  ?Transportation Needs: Not on file  ?Physical Activity: Not on file  ?Stress: Not on file  ?Social Connections: Not on file  ? ? ?Family History  ?Problem Relation Age of Onset  ? Diabetes Mother   ? AAA (abdominal aortic aneurysm) Mother 59  ? Blindness Mother   ? Diabetes Father   ? Stroke Father 22  ? Heart attack Father   ? Drug abuse Sister   ? Drug abuse Brother   ? Brain cancer Maternal Grandmother 60  ? Diabetes Paternal Grandmother   ? Stroke Paternal Grandmother   ? Diabetes Paternal Grandfather 30  ? Colon cancer Neg Hx   ? Colon polyps Neg Hx   ? Esophageal cancer Neg Hx   ? Stomach cancer Neg Hx   ? Rectal cancer Neg Hx   ? ? ?Past Medical History:  ?Diagnosis Date  ? Asthma   ? Cancer [facility])   ? Diabetes mellitus without complication ([place])   ? GERD (gastroesophageal reflux disease)   ? Hyperlipidemia   ? Hypertension   ? Opioid abuse ([place])   ? following with methadone clinic  ? Pneumonia   ? ? ?Past Surgical History:  ?Procedure Laterality Date  ? right bicep Right 2013  ? TRICEPS TENDON REPAIR Left 04/2013  ?  ? ?Current Outpatient Medications on File Prior to Visit  ?Medication Sig  Dispense Refill  ? albuterol (VENTOLIN HFA) 108 (90 Base) MCG/ACT inhaler INHALE 1 TO 2 PUFFS INTO THE LUNGS EVERY 6 HOURS AS NEEDED FOR WHEEZING OR SHORTNESS OF BREATH 20.1 g 1  ? apixaban (ELIQUIS) 5 MG TABS tablet Take 1 tablet (5 mg total) by mouth 2 (two) times daily. 60 tablet 5  ? blood glucose meter  kit and supplies KIT Dispense based on patient and insurance preference. Use up to four times daily as directed. 1 each 0  ? Blood Pressure Monitoring (COMFORT TOUCH BP CUFF/LARGE) MISC 1 each by Does not apply route daily as needed. 1 each 0  ? lisinopril (ZESTRIL) 20 MG tablet Take 1 tablet (20 mg total) by mouth daily. 90 tablet 3  ? Magnesium 400 MG TABS Take 400 mg by mouth daily.    ? metFORMIN (GLUCOPHAGE-XR) 500 MG 24 hr tablet Take 1 tablet (500 mg total) by mouth daily with breakfast. 90 tablet 1  ? methadone (DOLOPHINE) 10 MG/ML solution Take 85 mg by mouth daily.     ? multivitamin-iron-minerals-folic acid (CENTRUM) chewable tablet Chew 1 tablet by mouth daily.    ? Polyethylene Glycol 3350 (MIRALAX PO) Take 238 g by mouth.    ? rosuvastatin (CRESTOR) 10 MG tablet Take 1 tablet (10 mg total) by mouth daily. 90 tablet 3  ? Semaglutide, 1 MG/DOSE, 4 MG/3ML SOPN Inject 1 mg as directed once a week. 9 mL 1  ? sildenafil (VIAGRA) 100 MG tablet TAKE 1/2 TABLET(50 MG) BY MOUTH DAILY AS NEEDED FOR ERECTILE DYSFUNCTION 30 tablet 0  ? Spacer/Aero-Holding Chambers (E-Z SPACER) inhaler Use as instructed 1 each 2  ? Spacer/Aero-Holding Chambers DEVI 1 Device by Does not apply route daily as needed. 1 each 0  ? tamsulosin (FLOMAX) 0.4 MG CAPS capsule Take 1 capsule (0.4 mg total) by mouth daily. 90 capsule 3  ? Testosterone Enanthate (XYOSTED) 100 MG/0.5ML SOAJ Inject 100 mg into the skin once a week. 6 mL 2  ? valACYclovir (VALTREX) 1000 MG tablet Take 2 tablets PO BID x 1 day at onset of symptoms. 30 tablet 5  ? ?No current facility-administered medications on file prior to visit.  ? ? ?No Known  Allergies ? ?Physical exam: ? ?Today's Vitals  ? 12/15/21 1251  ?BP: 108/63  ?Pulse: (!) 59  ?Weight: 244 lb 8 oz (110.9 kg)  ?Height: _0  (1.753 m)  ? ?Body mass index is 36.11 kg/m?.  ? ?Wt Readings from Last 3 Encounte

## 2021-12-17 ENCOUNTER — Telehealth: Payer: Self-pay | Admitting: Family Medicine

## 2021-12-17 NOTE — Telephone Encounter (Signed)
Pt call and stated dr Ethlyn Gallery stated if he need to pick up some Eliquis  just give her a call and he is in need because he is waiting for his mail order to come in .pt would like a call back.

## 2021-12-18 NOTE — Telephone Encounter (Signed)
I will put samples up front for him today

## 2021-12-18 NOTE — Telephone Encounter (Signed)
Spoke with the patient and informed him of the message below.  Patient agreed to pick up the samples on Monday.

## 2021-12-21 ENCOUNTER — Ambulatory Visit (HOSPITAL_COMMUNITY): Payer: BC Managed Care – PPO | Attending: Cardiovascular Disease

## 2021-12-21 DIAGNOSIS — I700 Atherosclerosis of aorta: Secondary | ICD-10-CM | POA: Diagnosis present

## 2021-12-21 LAB — ECHOCARDIOGRAM COMPLETE
Area-P 1/2: 3.81 cm2
S' Lateral: 2.2 cm

## 2022-01-04 ENCOUNTER — Inpatient Hospital Stay (HOSPITAL_BASED_OUTPATIENT_CLINIC_OR_DEPARTMENT_OTHER): Payer: BC Managed Care – PPO | Admitting: Hematology and Oncology

## 2022-01-04 ENCOUNTER — Other Ambulatory Visit: Payer: Self-pay | Admitting: Hematology and Oncology

## 2022-01-04 ENCOUNTER — Other Ambulatory Visit: Payer: Self-pay

## 2022-01-04 ENCOUNTER — Inpatient Hospital Stay: Payer: BC Managed Care – PPO | Attending: Hematology and Oncology

## 2022-01-04 VITALS — BP 135/84 | HR 72 | Temp 98.9°F | Resp 18 | Ht 69.0 in | Wt 240.8 lb

## 2022-01-04 DIAGNOSIS — C9110 Chronic lymphocytic leukemia of B-cell type not having achieved remission: Secondary | ICD-10-CM | POA: Insufficient documentation

## 2022-01-04 DIAGNOSIS — I4891 Unspecified atrial fibrillation: Secondary | ICD-10-CM | POA: Diagnosis not present

## 2022-01-04 DIAGNOSIS — Z7901 Long term (current) use of anticoagulants: Secondary | ICD-10-CM | POA: Diagnosis not present

## 2022-01-04 DIAGNOSIS — D72820 Lymphocytosis (symptomatic): Secondary | ICD-10-CM

## 2022-01-04 LAB — CBC WITH DIFFERENTIAL (CANCER CENTER ONLY)
Abs Immature Granulocytes: 0.1 10*3/uL — ABNORMAL HIGH (ref 0.00–0.07)
Basophils Absolute: 0.1 10*3/uL (ref 0.0–0.1)
Basophils Relative: 1 %
Eosinophils Absolute: 0.6 10*3/uL — ABNORMAL HIGH (ref 0.0–0.5)
Eosinophils Relative: 4 %
HCT: 50.7 % (ref 39.0–52.0)
Hemoglobin: 16.9 g/dL (ref 13.0–17.0)
Immature Granulocytes: 1 %
Lymphocytes Relative: 44 %
Lymphs Abs: 6.4 10*3/uL — ABNORMAL HIGH (ref 0.7–4.0)
MCH: 29.8 pg (ref 26.0–34.0)
MCHC: 33.3 g/dL (ref 30.0–36.0)
MCV: 89.3 fL (ref 80.0–100.0)
Monocytes Absolute: 0.7 10*3/uL (ref 0.1–1.0)
Monocytes Relative: 5 %
Neutro Abs: 6.4 10*3/uL (ref 1.7–7.7)
Neutrophils Relative %: 45 %
Platelet Count: 279 10*3/uL (ref 150–400)
RBC: 5.68 MIL/uL (ref 4.22–5.81)
RDW: 11.9 % (ref 11.5–15.5)
Smear Review: NORMAL
WBC Count: 14.3 10*3/uL — ABNORMAL HIGH (ref 4.0–10.5)
nRBC: 0 % (ref 0.0–0.2)

## 2022-01-04 LAB — CMP (CANCER CENTER ONLY)
ALT: 15 U/L (ref 0–44)
AST: 11 U/L — ABNORMAL LOW (ref 15–41)
Albumin: 4.5 g/dL (ref 3.5–5.0)
Alkaline Phosphatase: 68 U/L (ref 38–126)
Anion gap: 5 (ref 5–15)
BUN: 11 mg/dL (ref 6–20)
CO2: 31 mmol/L (ref 22–32)
Calcium: 9.8 mg/dL (ref 8.9–10.3)
Chloride: 99 mmol/L (ref 98–111)
Creatinine: 1.27 mg/dL — ABNORMAL HIGH (ref 0.61–1.24)
GFR, Estimated: >60 mL/min (ref >60)
Glucose, Bld: 103 mg/dL — ABNORMAL HIGH (ref 70–99)
Potassium: 4.7 mmol/L (ref 3.5–5.1)
Sodium: 135 mmol/L (ref 135–145)
Total Bilirubin: 0.9 mg/dL (ref 0.3–1.2)
Total Protein: 7.1 g/dL (ref 6.5–8.1)

## 2022-01-04 LAB — LACTATE DEHYDROGENASE: LDH: 110 U/L (ref 98–192)

## 2022-01-04 NOTE — Progress Notes (Signed)
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 MCV 84.2. ANC 8600, ALC 1500 2) 10/13/2018: WBC 11.9, Hgb 13.8, Plt 342. MCV 84.7. [place] 5200, ALC 5000 3) 07/11/2019: WBC 11.8, Hgb 14.2, Plt 357, MCV 86.1. [place] 5300, ALC 5200 4) 07/25/2019: establish care with Dr. Lorenso Courier. Flow cytometry showed monoclonal B-cell population without expression of CD5 or CD10 comprises 59% of all lymphocytes. 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  5) 08/20/2019: CLL panel returned positive for 17p13 deletion and no evidence of t(11:14). Findings consistent with diagnosis of CLL. Entered in active surveillance.   Interval History:  Terry [name] 51 y.o. male with medical history significant for CLL Rai Stage 0 presents for a follow up visit. The patient's last visit was on 07/06/2021. In the interim since the last visit he has had no major changes in his health.   On exam today Terry [name] reports he has been good in the interim since her last visit.  His energy levels are good but unfortunately he was diagnosed with atrial fibrillation recently and started on Eliquis therapy.  He is not having any bleeding or bruising as a result of this medication.  He reports that he does occasionally have some persistent pain in his sternum and was started on a trial of PPIs which helped with this pain.  It resolved within 4 to 5 days.  He has markedly decreased in weight down to 240 pounds today.  This is 36 pounds down from his last visit.  He credits this to starting Ozempic and going to the gym more often.  He is also cutting out fast food and try to eat more fruit.  He does not have particular target goal would like to have a weight of approximately 2 20-200.   He otherwise denies having issues with fevers, chills, sweats, nausea, vomiting or diarrhea.  He denies having any palpable lymphadenopathy.  A full 10 point ROS is listed below.  MEDICAL HISTORY:  Past Medical History:  Diagnosis Date   Asthma    Cancer ([place])    Diabetes mellitus without complication ([place])    GERD (gastroesophageal reflux disease)    Hyperlipidemia    Hypertension    Opioid abuse ([place])    following with methadone clinic   Pneumonia     SURGICAL HISTORY: Past Surgical History:  Procedure Laterality Date   right bicep Right 2013   TRICEPS TENDON REPAIR Left 04/2013   ALLERGIES:  has No Known Allergies.  MEDICATIONS:  Current Outpatient Medications  Medication Sig Dispense Refill   albuterol (VENTOLIN HFA) 108 (90 Base) MCG/ACT inhaler INHALE 1 TO 2 PUFFS INTO THE LUNGS EVERY 6 HOURS AS NEEDED FOR WHEEZING OR SHORTNESS OF BREATH 20.1 g 1   apixaban (ELIQUIS) 5 MG TABS tablet Take 1 tablet (5 mg total) by mouth 2 (two) times daily. 60 tablet 5   blood glucose meter kit and supplies KIT Dispense based on patient and insurance preference. Use up to four times daily as directed. 1 each 0   Blood Pressure Monitoring (COMFORT TOUCH BP CUFF/LARGE) MISC 1 each by Does not apply route daily as needed. 1 each 0   lisinopril (ZESTRIL) 20 MG tablet Take 1 tablet (20 mg total) by mouth daily. 90 tablet  3   Magnesium 400 MG TABS Take 400 mg by mouth daily.     metFORMIN (GLUCOPHAGE-XR) 500 MG 24 hr tablet Take 1 tablet (500 mg total) by mouth daily with breakfast. 90 tablet 1   methadone (DOLOPHINE) 10 MG/ML solution Take 85 mg by mouth daily.      multivitamin-iron-minerals-folic acid (CENTRUM) chewable tablet Chew 1 tablet by mouth daily.     Polyethylene Glycol 3350 (MIRALAX PO) Take 238 g by mouth.     rosuvastatin (CRESTOR) 10 MG tablet Take 1 tablet (10 mg total) by mouth daily. 90 tablet 3   Semaglutide, 1 MG/DOSE, 4 MG/3ML SOPN Inject 1 mg as directed once a week. 9 mL  1   sildenafil (VIAGRA) 100 MG tablet TAKE 1/2 TABLET(50 MG) BY MOUTH DAILY AS NEEDED FOR ERECTILE DYSFUNCTION 30 tablet 0   Spacer/Aero-Holding Chambers (E-Z SPACER) inhaler Use as instructed 1 each 2   Spacer/Aero-Holding Chambers DEVI 1 Device by Does not apply route daily as needed. 1 each 0   tamsulosin (FLOMAX) 0.4 MG CAPS capsule Take 1 capsule (0.4 mg total) by mouth daily. 90 capsule 3   Testosterone Enanthate (XYOSTED) 100 MG/0.5ML SOAJ Inject 100 mg into the skin once a week. 6 mL 2   valACYclovir (VALTREX) 1000 MG tablet Take 2 tablets PO BID x 1 day at onset of symptoms. 30 tablet 5   No current facility-administered medications for this visit.    REVIEW OF SYSTEMS:   Constitutional: ( - ) fevers, ( - )  chills , ( - ) night sweats Eyes: ( - ) blurriness of vision, ( - ) double vision, ( - ) watery eyes Ears, nose, mouth, throat, and face: ( - ) mucositis, ( - ) sore throat Respiratory: ( - ) cough, ( - ) dyspnea, ( - ) wheezes Cardiovascular: ( - ) palpitation, ( - ) chest discomfort, ( - ) lower extremity swelling Gastrointestinal:  ( - ) nausea, ( - ) heartburn, ( - ) change in bowel habits Skin: ( - ) abnormal skin rashes Lymphatics: ( - ) new lymphadenopathy, ( - ) easy bruising Neurological: ( - ) numbness, ( - ) tingling, ( - ) new weaknesses Behavioral/Psych: ( - ) mood change, ( - ) new changes  All other systems were reviewed with the patient and are negative.  PHYSICAL EXAMINATION: ECOG PERFORMANCE STATUS: 0 - Asymptomatic  Vitals:   01/04/22 1150  BP: 135/84  Pulse: 72  Resp: 18  Temp: 98.9 F (37.2 C)  SpO2: 100%    Filed Weights   01/04/22 1150  Weight: 240 lb 12.8 oz (109.2 kg)     GENERAL: well appearing middle aged male in NAD  SKIN: skin color, texture, turgor are normal, no rashes or significant lesions EYES: conjunctiva are pink and non-injected, sclera clear NECK: supple, non-tender  LYMPH:  no palpable lymphadenopathy in the cervical,  axillary or supraclavicular lymph nodes.  LUNGS: clear to auscultation and percussion with normal breathing effort HEART: regular rate & rhythm and no murmurs and +1 bilateral lower extremity edema ABDOMEN: limited due to body habitus.  Musculoskeletal: no cyanosis of digits and no clubbing  PSYCH: alert & oriented x 3, fluent speech NEURO: no focal motor/sensory deficits  LABORATORY DATA:  I have reviewed the data as listed    Latest Ref Rng & Units 01/04/2022   11:18 AM 12/02/2021   10:34 AM 07/06/2021   10:54 AM  CBC  WBC 4.0 - 10.5 K/uL  14.3   14.8   14.2    Hemoglobin 13.0 - 17.0 g/dL 16.9   15.1   15.3    Hematocrit 39.0 - 52.0 % 50.7   46.2   45.1    Platelets 150 - 400 K/uL 279   348.0   258         Latest Ref Rng & Units 01/04/2022   11:18 AM 12/02/2021   10:34 AM 07/06/2021   10:54 AM  CMP  Glucose 70 - 99 mg/dL 103   75   128    BUN 6 - 20 mg/dL $Remove'11   21   15    'lrLzRKE$ Creatinine 0.61 - 1.24 mg/dL 1.27   1.33   1.18    Sodium 135 - 145 mmol/L 135   139   137    Potassium 3.5 - 5.1 mmol/L 4.7   4.2   4.4    Chloride 98 - 111 mmol/L 99   100   102    CO2 22 - 32 mmol/L $RemoveB'31   30   27    'vYYSGhIC$ Calcium 8.9 - 10.3 mg/dL 9.8   9.7   8.7    Total Protein 6.5 - 8.1 g/dL 7.1   7.1   6.9    Total Bilirubin 0.3 - 1.2 mg/dL 0.9   0.9   0.8    Alkaline Phos 38 - 126 U/L 68   64   64    AST 15 - 41 U/L $Remo'11   27   18    'jAgmN$ ALT 0 - 44 U/L 15   27   33      BLOOD FILM: None today.   RADIOGRAPHIC STUDIES: I have personally reviewed the radiological images as listed and agreed with the findings in the report: no evidence of lymphadenopathy or splenomegaly.   ECHOCARDIOGRAM COMPLETE  Result Date: 12/21/2021    ECHOCARDIOGRAM REPORT   Patient Name:   Terry [name] Date of Exam: 12/21/2021 Medical Rec #:  [id number]           Height:       69.0 in Accession #:    [id number]          Weight:       244.5 lb Date of Birth:  02-01-1971          BSA:          2.250 m Patient Age:    12 years            BP:            108/63 mmHg Patient Gender: M                   HR:           82 bpm. 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 Arrythmias:Atrial Fibrillation,                 Signs/Symptoms:Shortness of Breath; Risk Factors:Family History                 of Coronary Artery Disease, Hypertension, Diabetes and  Dyslipidemia. Obesity, Chronic Lymphcytic Leukemia, History of                 Opiod Abuse (follows with [facility]).  Sonographer:    Deliah Boston RDCS Referring Phys: ARUN K [place]  1. Left ventricular ejection fraction, by estimation, is 60 to 65%. The left ventricle has normal function. The left ventricle has no regional wall motion abnormalities. Left ventricular diastolic parameters are consistent with Grade I diastolic dysfunction (impaired relaxation).  2. Right ventricular systolic function is normal. The right ventricular size is normal. There is normal pulmonary artery systolic pressure.  3. Left atrial size was mildly dilated.  4. Right atrial size was moderately dilated.  5. The mitral valve is normal in structure. Trivial mitral valve regurgitation. No evidence of mitral stenosis.  6. The aortic valve is tricuspid. Aortic valve regurgitation is not visualized. No aortic stenosis is present.  7. The inferior vena cava is normal in size with greater than 50% respiratory variability, suggesting right atrial pressure of 3 mmHg. FINDINGS  Left Ventricle: Left ventricular ejection fraction, by estimation, is 60 to 65%. The left ventricle has normal function. The left ventricle has no regional wall motion abnormalities. The left ventricular internal cavity size was normal in size. There is  no left ventricular hypertrophy. Left ventricular diastolic parameters are  consistent with Grade I diastolic dysfunction (impaired relaxation). Right Ventricle: The right ventricular size is normal. No increase in right ventricular wall thickness. Right ventricular systolic function is normal. There is normal pulmonary artery systolic pressure. The tricuspid regurgitant velocity is 2.81 m/s, and  with an assumed right atrial pressure of 3 mmHg, the estimated right ventricular systolic pressure is 35.5 mmHg. Left Atrium: Left atrial size was mildly dilated. Right Atrium: Right atrial size was moderately dilated. Pericardium: There is no evidence of pericardial effusion. Mitral Valve: The mitral valve is normal in structure. Trivial mitral valve regurgitation. No evidence of mitral valve stenosis. Tricuspid Valve: The tricuspid valve is normal in structure. Tricuspid valve regurgitation is mild . No evidence of tricuspid stenosis. Aortic Valve: The aortic valve is tricuspid. Aortic valve regurgitation is not visualized. No aortic stenosis is present. Pulmonic Valve: The pulmonic valve was normal in structure. Pulmonic valve regurgitation is not visualized. No evidence of pulmonic stenosis. Aorta: The aortic root is normal in size and structure. Venous: The inferior vena cava is normal in size with greater than 50% respiratory variability, suggesting right atrial pressure of 3 mmHg. IAS/Shunts: No atrial level shunt detected by color flow Doppler.  LEFT VENTRICLE PLAX 2D LVIDd:         4.40 cm   Diastology LVIDs:         2.20 cm   LV e' medial:    7.83 cm/s LV PW:         0.80 cm   LV E/e' medial:  7.5 LV IVS:        0.90 cm   LV e' lateral:   18.80 cm/s LVOT diam:     2.10 cm   LV E/e' lateral: 3.1 LV SV:         62 LV SV Index:   28        2D Longitudinal Strain LVOT Area:     3.46 cm  2D Strain GLS (A2C):   13.8 %                          2D  Strain GLS (A3C):   27.2 %                          2D Strain GLS (A4C):   23.6 %                          2D Strain GLS Avg:     21.6 %                            3D Volume EF:                          3D EF:        64 %                          LV EDV:       159 ml                          LV ESV:       57 ml                          LV SV:        102 ml RIGHT VENTRICLE RV Basal diam:  4.20 cm RV Mid diam:    3.50 cm RV S prime:     13.30 cm/s TAPSE (M-mode): 2.3 cm LEFT ATRIUM             Index        RIGHT ATRIUM           Index LA diam:        4.20 cm 1.87 cm/m   RA Area:     24.40 cm LA Vol (A2C):   58.5 ml 26.00 ml/m  RA Volume:   86.10 ml  38.26 ml/m LA Vol (A4C):   65.6 ml 29.15 ml/m LA Biplane Vol: 64.8 ml 28.80 ml/m  AORTIC VALVE LVOT Vmax:   98.50 cm/s LVOT Vmean:  63.133 cm/s LVOT VTI:    0.179 m  AORTA Ao Root diam: 3.30 cm Ao Asc diam:  3.10 cm MITRAL VALVE               TRICUSPID VALVE MV Area (PHT)  cm         TR Peak grad:   31.6 mmHg MV Decel Time: 199 msec    TR Vmax:        281.00 cm/s MV E velocity: 58.35 cm/s MV A velocity: 80.95 cm/s  SHUNTS MV E/A ratio:  0.72        Systemic VTI:  0.18 m                            Systemic Diam: 2.10 cm Skeet Latch MD Electronically signed by Skeet Latch MD Signature Date/Time: 12/21/2021/1:08:32 PM    Final     ASSESSMENT & PLAN Alexandro Line 51 y.o. male with medical history significant for CLL Rai Stage 0 presents for a follow up visit. The patient's last visit was on 07/25/2019 at which time he established care in our clinic.  After review of the imaging and the lab work the patient's findings are most consistent with CLL.  Given that he only has a lymphocytosis this would be considered a CLL Rai stage 0.  There is no clear indication for treatment at this time and I would recommend that he enter into active surveillance with close clinical monitoring and strict return precautions.  Today Terry [name] notes he has been well in the interim since his last visit. He was recently diagnosed with atrial fibrillation and started on Eliquis, but has been in his normal state of health  otherwise. He is asymptomatic with no unintentional weight loss, fevers, chills, sweats, or new lymphadenopathy.   Previously we discussed the nature of CLL, the relatively good prognosis, the meaning of his a 17p13 deletion, and possible treatments moving forward.  We also discussed that this tends to be a more indolent cancer and that close monitoring which is appropriate at this time.  We also discussed the criteria for which we would want to start treatment including bulky lymphadenopathy, anemia, or thrombocytopenia.  Terry [name] and his wife expressed their understanding of the disease and the plan moving forward.  In the event treatment is required could consider therapy with ibrutinib +/- rituximab, Acalabrutinib +/- obinutuzumab, or Venetoclax +obinutuzumab. Of these treatment options only Venetoclax/obinutuzumab would be of limited duration (12 months). We can discuss these treatments in detail when the time comes.   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 Will continue to monitor in clinic with serial visits and blood work to assess for progression of the disease.  -- Today labs show white blood cell count 14.3, hemoglobin 16.9, platelets of 279. --plan for labs q6 months with CBC, CMP, LDH --Plan to have patient return in 6 months with PRN visits if new symptoms were to develop.   #Atrial Fibrillation --currently on eliquis therapy 5mg  BID  No orders of the defined types were placed in this encounter.   All questions were answered. The patient knows to call the clinic with any problems, questions or concerns.  A total of more than 30 minutes were spent on this encounter and over half of that time was spent on counseling and coordination of care as outlined above.   Ledell Peoples, MD Department of Hematology/Oncology [place] at [facility] Phone: [phone] Pager: [phone] Email:  [email or web address]  01/04/2022 1:28 PM

## 2022-02-04 ENCOUNTER — Telehealth: Payer: Self-pay | Admitting: Neurology

## 2022-02-04 NOTE — Telephone Encounter (Signed)
[employer]- [employer] Josem Kaufmann: [id number] (exp. 02/03/22 to 04/04/22) bed 2 male tech. Patient is scheduled at [facility] for 02/25/22 at 9 pm. Mailed packet to patient.

## 2022-02-08 ENCOUNTER — Telehealth: Payer: Self-pay | Admitting: *Deleted

## 2022-02-08 NOTE — Telephone Encounter (Signed)
Prior auth for Xyosted '100mg'$  sent to [email or web address].

## 2022-02-11 NOTE — Telephone Encounter (Signed)
Fax received from [employer] requesting if the patient's testosterone levels were within or below normal range.  The answer was "yes", last 2 levels with the dates were written on the form, faxed to [phone] and sent to be scanned.

## 2022-02-25 ENCOUNTER — Ambulatory Visit (INDEPENDENT_AMBULATORY_CARE_PROVIDER_SITE_OTHER): Payer: BC Managed Care – PPO | Admitting: Neurology

## 2022-02-25 ENCOUNTER — Other Ambulatory Visit: Payer: Self-pay | Admitting: *Deleted

## 2022-02-25 DIAGNOSIS — E1165 Type 2 diabetes mellitus with hyperglycemia: Secondary | ICD-10-CM

## 2022-02-25 DIAGNOSIS — E119 Type 2 diabetes mellitus without complications: Secondary | ICD-10-CM

## 2022-02-25 DIAGNOSIS — G4726 Circadian rhythm sleep disorder, shift work type: Secondary | ICD-10-CM

## 2022-02-25 DIAGNOSIS — Z9189 Other specified personal risk factors, not elsewhere classified: Secondary | ICD-10-CM

## 2022-02-25 DIAGNOSIS — E661 Drug-induced obesity: Secondary | ICD-10-CM

## 2022-02-25 DIAGNOSIS — G478 Other sleep disorders: Secondary | ICD-10-CM | POA: Diagnosis not present

## 2022-02-25 DIAGNOSIS — I480 Paroxysmal atrial fibrillation: Secondary | ICD-10-CM

## 2022-02-25 DIAGNOSIS — C9110 Chronic lymphocytic leukemia of B-cell type not having achieved remission: Secondary | ICD-10-CM

## 2022-03-01 ENCOUNTER — Telehealth: Payer: Self-pay | Admitting: Neurology

## 2022-03-01 MED ORDER — METFORMIN HCL ER 500 MG PO TB24: 500.0000 mg | ORAL_TABLET | Freq: Every day | ORAL | 0 refills | Status: DC

## 2022-03-01 NOTE — Addendum Note (Signed)
Addended by: Larey Seat on: 03/01/2022 01:23 PM   Modules accepted: Orders

## 2022-03-01 NOTE — Progress Notes (Signed)
IMPRESSION:  1. There is mild Complex Sleep Apnea (CSA) present, AHI 11.3/h.  This consisted mostly of mixed apneas, followed by obstructive apneas.  2. Loud Snoring was recorded. 3. Regular slow EKG    RECOMMENDATIONS:  1. Advise full-night, attended, CPAP titration study to optimize therapy. I would like to make sure we are not inducing central apneas in this case of complex sleep apnea.  2. Plan B:  if insurance will not allow this, I will use an auto-titration CPAP device to initiate therapy, pressure settings between 6-15 cm water with 3 cm EPR.  Likely needing a FFM for snoring, heated humidification is advised. Prefer ResMed device.   I certify that I have reviewed the entire raw data recording prior to the issuance of this report in accordance with the Standards of Accreditation of the [place] [employer] (AASM)

## 2022-03-01 NOTE — Procedures (Signed)
PATIENT'S NAME:  Terry [name], Terry [name] DOB:      1971/01/10      MR#:    [id number]     DATE OF RECORDING: 02/25/2022 REFERRING M.D.:  Micheline Rough, MD Study Performed:   Baseline Polysomnogram HISTORY:  Terry [name] is a 51 -year- old -male patient with an interesting ancestry; he has a Caucasian mother and [place] father. He was seen upon referral on 12/15/2021 from Dr. Ethlyn Gallery, MD.    Chief concern according to patient :  "poorer sleep quality, un-refreshing sleep, wife has witnessed apneas".   Terry [name] has a past medical history of Asthma, atrial fibrillation, CLL/ leukemia Cancer ([place]), Diabetes mellitus without complication ([place]), GERD (gastroesophageal reflux disease), Hyperlipidemia, Hypertension, Opioid abuse ([place]), and Pneumonia.    The patient endorsed the Epworth Sleepiness Scale at 18/24 points. FSS at 45/63 points.    The patient's weight 244 pounds with a height of 69 (inches), resulting in a BMI of 36.2 kg/m2.  The patient's neck circumference measured 18 inches.  CURRENT MEDICATIONS: Ventolin HFA, Eliquis, Zestril, Magnesium, Glucophage XR, Dolophine, Centrum vitamins, Miralax, Crestor, Semaglutide, Viagra, Flomax, Xyosted, Valtrex   PROCEDURE:  This is a multichannel digital polysomnogram utilizing the Somnostar 11.2 system.  Electrodes and sensors were applied and monitored per AASM Specifications.   EEG, EOG, Chin and Limb EMG, were sampled at 200 Hz.  ECG, Snore and Nasal Pressure, Thermal Airflow, Respiratory Effort, CPAP Flow and Pressure, Oximetry was sampled at 50 Hz. Digital video and audio were recorded.      BASELINE STUDY: Lights Out was at 00:03 and Lights On at 07:13.  Total recording time (TRT) was 430 minutes, with a total sleep time (TST) of 388 minutes.   The patient's sleep latency was 48.5 minutes.  REM latency was 222.5 minutes.  The sleep efficiency was 90.2 %.     SLEEP ARCHITECTURE: WASO (Wake after sleep onset) was 38 minutes.   There were 7 minutes in Stage N1, 281 minutes Stage N2, 50 minutes Stage N3 and 50 minutes in Stage REM.  The percentage of Stage N1 was 1.8%, Stage N2 was 72.4%, Stage N3 was 12.9% and Stage R (REM sleep) was 12.9%.   RESPIRATORY ANALYSIS:  There were a total of 73 respiratory events:  6 obstructive apneas, 2 central apneas and 12 mixed apneas with a total of 20 apneas and an apnea index (AI) of 3.1 /hour. There were 53 hypopneas with a hypopnea index of 8.2 /hour.      The total APNEA/HYPOPNEA INDEX (AHI) was 11.3/.  10 events occurred in REM sleep and 101 events in NREM. The REM AHI was  12 /hour, versus a non-REM AHI of 11.2. The patient spent 291 minutes of total sleep time in the supine position and 97 minutes in non-supine. The supine AHI was 14.8/h versus a non-supine AHI of 0.6.  OXYGEN SATURATION & C02:  The Wake baseline 02 saturation was 91%, with the lowest being 85%. Time spent below 89% saturation equaled 15 minutes.  The patient had a total of 0 Periodic Limb Movements.  The arousals were noted as: 27 were spontaneous, 0 were associated with PLMs, 5 were associated with respiratory events.  Audio and video analysis did not show any abnormal or unusual movements, behaviors, phonations or vocalizations.   Snoring was noted. EKG was in keeping with slow sinus rhythm (NSR).  IMPRESSION:  There is mild Complex Sleep Apnea (CSA) present, AHI 11.3/h.  This consisted mostly of mixed  apneas, followed by obstructive apneas.  Loud Snoring was recorded. Regular slow EKG    RECOMMENDATIONS:  Advise full-night, attended, CPAP titration study to optimize therapy. I would like to make sure we are not inducing central apneas in this case of complex sleep apnea.  Plan B:  if insurance will not allow this, I will use an auto-titration CPAP device to initiate therapy, pressure settings between 6-15 cm water with 3 cm EPR.  Likely needing a FFM for snoring, heated humidification is advised.  Prefer ResMed device.   I certify that I have reviewed the entire raw data recording prior to the issuance of this report in accordance with the Standards of Accreditation of the [place] [employer] (AASM)   Larey Seat, MD Medical Director, [facility] at [facility], [place] [facility] (Neurology and Sleep Medicine)

## 2022-03-01 NOTE — Telephone Encounter (Signed)
-----   Message from Larey Seat, MD sent at 03/01/2022  1:23 PM EDT ----- IMPRESSION:  1. There is mild Complex Sleep Apnea (CSA) present, AHI 11.3/h.  This consisted mostly of mixed apneas, followed by obstructive apneas.  2. Loud Snoring was recorded. 3. Regular slow EKG    RECOMMENDATIONS:  1. Advise full-night, attended, CPAP titration study to optimize therapy. I would like to make sure we are not inducing central apneas in this case of complex sleep apnea.  2. Plan B:  if insurance will not allow this, I will use an auto-titration CPAP device to initiate therapy, pressure settings between 6-15 cm water with 3 cm EPR.  Likely needing a FFM for snoring, heated humidification is advised. Prefer ResMed device.   I certify that I have reviewed the entire raw data recording prior to the issuance of this report in accordance with the Standards of Accreditation of the [place] [employer] (AASM)

## 2022-03-01 NOTE — Progress Notes (Signed)
IMPRESSION:  1. There is mild Complex Sleep Apnea (CSA) present, AHI 11.3/h.  This consisted mostly of mixed apneas, followed by obstructive apneas.  2. Loud Snoring was recorded. 3. Regular slow EKG    RECOMMENDATIONS:  1. Advise full-night, attended, CPAP titration study to optimize therapy. I would like to make sure we are not inducing central apneas in this case of complex sleep apnea. Order for sleep study was placed.  2. Plan B:  if insurance will not allow this, I will use an auto-titration CPAP device to initiate therapy, pressure settings between 6-15 cm water with 3 cm EPR.  Likely needing a FFM for snoring, heated humidification is advised. Prefer ResMed device. DME order was not placed , depends on part 1.

## 2022-03-01 NOTE — Telephone Encounter (Signed)
I called pt. I advised pt that Dr. Brett Fairy reviewed their sleep study results and found that has mild complex sleep apnea and recommends that pt be treated with a cpap. Dr. Brett Fairy recommends that pt return for a repeat sleep study in order to properly titrate the cpap and ensure a good mask fit. Pt is agreeable to returning for a titration study. I advised pt that our sleep lab will file with pt's insurance and call pt to schedule the sleep study when we hear back from the pt's insurance regarding coverage of this sleep study. Pt verbalized understanding of results. Pt had no questions at this time but was encouraged to call back if questions arise.

## 2022-03-15 NOTE — Telephone Encounter (Signed)
Patient was scheduled for 42-monthfollow-up visit after prior initial consult visit with Dr. DBrett Fairy  He is currently awaiting to be scheduled for titration study after recent PSG 7/27 showed mild complex sleep apnea. He is scheduled to be seen in office 8/16. Please cancel this f/u visit and can reschedule once he obtains CPAP. Thank you.

## 2022-03-15 NOTE — Progress Notes (Deleted)
[facility] [address] [place]. [place] [address] [phone]       OFFICE FOLLOW UP NOTE  Mr. Terry [name] Date of Birth:  1971-04-17 Medical Record Number:  [id number]   Reason for visit: Initial CPAP follow-up    SUBJECTIVE:   CHIEF COMPLAINT:  No chief complaint on file.   HPI:   Update 03/15/2022 JM: Patient returns for initial CPAP compliance visit.       Epworth Sleepiness Scale ***/24 (prior to CPAP 18/24) Fatigue severity scale ***/63 (prior to CPAP 45/63)      Consult visit 12/15/2021 Dr. Brett Fairy: Terry [name] is a 3 -year- old  male patient with caucasian mother and Leretha Dykes father seen here upon referral on 12/15/2021 from Dr. Ethlyn Gallery, MD.    Chief concern according to patient :  poorer sleep quality, un-refreshing sleep, wife has witnessed apnea.   I have the pleasure of seeing Terry [name] today, a 51 -handed Other or two or more races male with a possible sleep disorder. He  has a past medical history of Asthma, atrial fibrillation,  CLL/ leukemia Cancer ([place]), Diabetes mellitus without complication ([place]), GERD (gastroesophageal reflux disease), Hyperlipidemia, Hypertension, Opioid abuse ([place]), and Pneumonia.    Sleep relevant medical history: Nocturia until 6 months ago, now not as much on Ozempic, wisdom teeth removed.    Family medical /sleep history: No other family member on CPAP with OSA, insomnia, sleep walkers.  Parents have passed: Mother died of COVID 2020, father of Stroke, MI and CAD. Both had DM.    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  and lives in a household with spouse , with one of 3 children living at home. The patient currently works/ used to work in shifts( Presenter, broadcasting,) Pets are present, 2 cats and a turtle. Tobacco use- never .   ETOH use : seldomly,  Caffeine intake in  form of Coffee( /) Soda( 7 cans a day, cutting back now) Tea ( /) or energy drinks. Regular exercise in form of walking. Gym after work.  .       Sleep habits are as follows: The patient's dinner time is between on day shift at 8 PM. On nightshift- eating at 10 Pm at work. He will then eat a meal just after coming home in AM and goes to sleep by 8.30 AM. His daytime sleep is confusing to him, he gets 5 or ore hours of sleep.   The preferred sleep position is supine in a recliner -, with the support of 0 pillows.  Dreams are reportedly frequent/vivid.  6 AM  AM is the usual rise time for the day shift. The patient wakes up spontaneously.  He reports not feeling refreshed or restored in AM, with symptoms such as dry mouth, sinus  pressure, morning headaches, and residual fatigue. He can't breathe through his nose.  Naps are taken frequently when off work , lasting from  to 3-5 hours.         ROS:   14 system review of systems performed and negative with exception of ***  PMH:  Past Medical History:  Diagnosis Date   Asthma    Cancer ([place])    Diabetes mellitus without complication ([place])    GERD (gastroesophageal reflux disease)    Hyperlipidemia    Hypertension    Opioid abuse ([place])    following with methadone clinic  Pneumonia     PSH:  Past Surgical History:  Procedure Laterality Date   right bicep Right 2013   TRICEPS TENDON REPAIR Left 04/2013    Social History:  Social History   Socioeconomic History   Marital status: Married    Spouse name: Terry [name]   Number of children: 3   Years of education: Not on file   Highest education level: 9th grade  Occupational History   Not on file  Tobacco Use   Smoking status: Never   Smokeless tobacco: Never  Vaping Use   Vaping Use: Never used  Substance and Sexual Activity   Alcohol use: Yes   Drug use: Yes    Types: Marijuana   Sexual activity: Yes  Other Topics Concern   Not on file  Social History Narrative    Lives at home with wife and children   R handed   Caffeine: 7 cans of diet pepsi a day.    Social Determinants of Health   Financial Resource Strain: Not on file  Food Insecurity: Not on file  Transportation Needs: Not on file  Physical Activity: Not on file  Stress: Not on file  Social Connections: Not on file  Intimate Partner Violence: Not on file    Family History:  Family History  Problem Relation Age of Onset   Diabetes Mother    AAA (abdominal aortic aneurysm) Mother 67   Blindness Mother    Diabetes Father    Stroke Father 75   Heart attack Father    Drug abuse Sister    Drug abuse Brother    Brain cancer Maternal Grandmother 39   Diabetes Paternal Grandmother    Stroke Paternal Grandmother    Diabetes Paternal Grandfather 45   Colon cancer Neg Hx    Colon polyps Neg Hx    Esophageal cancer Neg Hx    Stomach cancer Neg Hx    Rectal cancer Neg Hx     Medications:   Current Outpatient Medications on File Prior to Visit  Medication Sig Dispense Refill   albuterol (VENTOLIN HFA) 108 (90 Base) MCG/ACT inhaler INHALE 1 TO 2 PUFFS INTO THE LUNGS EVERY 6 HOURS AS NEEDED FOR WHEEZING OR SHORTNESS OF BREATH 20.1 g 1   apixaban (ELIQUIS) 5 MG TABS tablet Take 1 tablet (5 mg total) by mouth 2 (two) times daily. 60 tablet 5   blood glucose meter kit and supplies KIT Dispense based on patient and insurance preference. Use up to four times daily as directed. 1 each 0   Blood Pressure Monitoring (COMFORT TOUCH BP CUFF/LARGE) MISC 1 each by Does not apply route daily as needed. 1 each 0   lisinopril (ZESTRIL) 20 MG tablet Take 1 tablet (20 mg total) by mouth daily. 90 tablet 3   Magnesium 400 MG TABS Take 400 mg by mouth daily.     metFORMIN (GLUCOPHAGE-XR) 500 MG 24 hr tablet Take 1 tablet (500 mg total) by mouth daily with breakfast. 90 tablet 0   methadone (DOLOPHINE) 10 MG/ML solution Take 85 mg by mouth daily.      multivitamin-iron-minerals-folic acid (CENTRUM) chewable  tablet Chew 1 tablet by mouth daily.     Polyethylene Glycol 3350 (MIRALAX PO) Take 238 g by mouth.     rosuvastatin (CRESTOR) 10 MG tablet Take 1 tablet (10 mg total) by mouth daily. 90 tablet 3   Semaglutide, 1 MG/DOSE, 4 MG/3ML SOPN Inject 1 mg as directed once a week. 9 mL 1  sildenafil (VIAGRA) 100 MG tablet TAKE 1/2 TABLET(50 MG) BY MOUTH DAILY AS NEEDED FOR ERECTILE DYSFUNCTION 30 tablet 0   Spacer/Aero-Holding Chambers (E-Z SPACER) inhaler Use as instructed 1 each 2   Spacer/Aero-Holding Chambers DEVI 1 Device by Does not apply route daily as needed. 1 each 0   tamsulosin (FLOMAX) 0.4 MG CAPS capsule Take 1 capsule (0.4 mg total) by mouth daily. 90 capsule 3   Testosterone Enanthate (XYOSTED) 100 MG/0.5ML SOAJ Inject 100 mg into the skin once a week. 6 mL 2   valACYclovir (VALTREX) 1000 MG tablet Take 2 tablets PO BID x 1 day at onset of symptoms. 30 tablet 5   No current facility-administered medications on file prior to visit.    Allergies:  No Known Allergies    OBJECTIVE:  Physical Exam  There were no vitals filed for this visit. There is no height or weight on file to calculate BMI. No results found.   General: well developed, well nourished, seated, in no evident distress Head: head normocephalic and atraumatic.   Neck: supple with no carotid or supraclavicular bruits Cardiovascular: regular rate and rhythm, no murmurs Musculoskeletal: no deformity Skin:  no rash/petichiae Vascular:  Normal pulses all extremities   Neurologic Exam Mental Status: Awake and fully alert. Oriented to place and time. Recent and remote memory intact. Attention span, concentration and fund of knowledge appropriate. Mood and affect appropriate.  Cranial Nerves: Pupils equal, briskly reactive to light. Extraocular movements full without nystagmus. Visual fields full to confrontation. Hearing intact. Facial sensation intact. Face, tongue, palate moves normally and symmetrically.  Motor:  Normal bulk and tone. Normal strength in all tested extremity muscles Sensory.: intact to touch , pinprick , position and vibratory sensation.  Coordination: Rapid alternating movements normal in all extremities. Finger-to-nose and heel-to-shin performed accurately bilaterally. Gait and Station: Arises from chair without difficulty. Stance is normal. Gait demonstrates normal stride length and balance without use of AD. Tandem walk and heel toe without difficulty.  Reflexes: 1+ and symmetric. Toes downgoing.         ASSESSMENT: Benjermin Korber is a 51 y.o. year old male ***.      PLAN:  OSA on CPAP :     Follow up in *** or call earlier if needed   CC:  PCP: [name], MD (Inactive)    I spent *** minutes of face-to-face and non-face-to-face time with patient.  This included previsit chart review, lab review, study review, order entry, electronic health record documentation, patient education regarding diagnosis of sleep apnea with review and discussion of compliance report and answered all other questions to patient's satisfaction   Frann Rider, [facility]  [facility] [address] [place], [place] [address]  Phone [phone] Fax [phone] Note: This document was prepared with digital dictation and possible smart phrase technology. Any transcriptional errors that result from this process are unintentional.

## 2022-03-15 NOTE — Telephone Encounter (Signed)
Contacted pt, LVM rq CB 

## 2022-03-16 NOTE — Telephone Encounter (Signed)
Contacted pt again, LVM rq CB 

## 2022-03-16 NOTE — Telephone Encounter (Signed)
PHONE STAFF CAN RELAY  If pt calls back, Please cancel this f/u visit 8/16 and pt can reschedule once he obtains CPAP and has at least 30 days of usage on machine.

## 2022-03-17 ENCOUNTER — Ambulatory Visit: Payer: BC Managed Care – PPO | Admitting: Adult Health

## 2022-03-17 NOTE — Telephone Encounter (Signed)
Finally received auth from insurance. Pt is scheduled for 04/28/22 @ 9pm (pt chose this date in September)

## 2022-03-17 NOTE — Telephone Encounter (Addendum)
Pt came into office, informed him he needs titration study completed prior to receiving CPAP. He stated he never got called for study. Can someone help him get scheduled? Can LVM if no answer per pt

## 2022-03-17 NOTE — Telephone Encounter (Signed)
CPAP - male tech- [id number] 8/16-10/15/2023

## 2022-04-27 NOTE — Telephone Encounter (Signed)
Patient call and r/s for 05/26/22 at 9 pm.  Mailed a new packet.

## 2022-05-04 NOTE — Telephone Encounter (Signed)
Updated [employer] auth: Cpap Titration- [employer] Josem Kaufmann: [id number] (exp. 03/17/22 to 06/15/22)-male tech

## 2022-05-24 ENCOUNTER — Other Ambulatory Visit: Payer: Self-pay | Admitting: *Deleted

## 2022-05-24 DIAGNOSIS — E1165 Type 2 diabetes mellitus with hyperglycemia: Secondary | ICD-10-CM

## 2022-05-24 DIAGNOSIS — B001 Herpesviral vesicular dermatitis: Secondary | ICD-10-CM

## 2022-05-24 MED ORDER — SEMAGLUTIDE (1 MG/DOSE) 4 MG/3ML ~~LOC~~ SOPN: 1.0000 mg | PEN_INJECTOR | SUBCUTANEOUS | 0 refills | Status: DC

## 2022-05-24 MED ORDER — VALACYCLOVIR HCL 1 G PO TABS: ORAL_TABLET | ORAL | 0 refills | Status: AC

## 2022-05-24 NOTE — Telephone Encounter (Signed)
Ok to refill but he needs a 6 month follow up in November

## 2022-05-26 NOTE — Telephone Encounter (Signed)
Patient left a voicemail on my phone on 05/25/22 stating that he wanted to cancel his sleep study at this time.

## 2022-05-27 ENCOUNTER — Encounter (HOSPITAL_COMMUNITY): Payer: Self-pay | Admitting: Emergency Medicine

## 2022-05-27 ENCOUNTER — Emergency Department (HOSPITAL_COMMUNITY): Payer: BC Managed Care – PPO

## 2022-05-27 ENCOUNTER — Other Ambulatory Visit: Payer: Self-pay

## 2022-05-27 ENCOUNTER — Emergency Department (HOSPITAL_COMMUNITY)
Admission: EM | Admit: 2022-05-27 | Discharge: 2022-05-27 | Disposition: A | Discharge status: Home / Self Care | Payer: BC Managed Care – PPO | Attending: Emergency Medicine | Admitting: Emergency Medicine

## 2022-05-27 DIAGNOSIS — Z79899 Other long term (current) drug therapy: Secondary | ICD-10-CM | POA: Diagnosis not present

## 2022-05-27 DIAGNOSIS — Z859 Personal history of malignant neoplasm, unspecified: Secondary | ICD-10-CM | POA: Diagnosis not present

## 2022-05-27 DIAGNOSIS — E119 Type 2 diabetes mellitus without complications: Secondary | ICD-10-CM | POA: Diagnosis not present

## 2022-05-27 DIAGNOSIS — R079 Chest pain, unspecified: Secondary | ICD-10-CM | POA: Insufficient documentation

## 2022-05-27 DIAGNOSIS — I10 Essential (primary) hypertension: Secondary | ICD-10-CM | POA: Insufficient documentation

## 2022-05-27 DIAGNOSIS — J45909 Unspecified asthma, uncomplicated: Secondary | ICD-10-CM | POA: Insufficient documentation

## 2022-05-27 DIAGNOSIS — Z7951 Long term (current) use of inhaled steroids: Secondary | ICD-10-CM | POA: Diagnosis not present

## 2022-05-27 DIAGNOSIS — Z7984 Long term (current) use of oral hypoglycemic drugs: Secondary | ICD-10-CM | POA: Insufficient documentation

## 2022-05-27 DIAGNOSIS — R000 Tachycardia, unspecified: Secondary | ICD-10-CM | POA: Insufficient documentation

## 2022-05-27 DIAGNOSIS — Z7901 Long term (current) use of anticoagulants: Secondary | ICD-10-CM | POA: Insufficient documentation

## 2022-05-27 LAB — DIFFERENTIAL
Abs Immature Granulocytes: 0 10*3/uL (ref 0.00–0.07)
Band Neutrophils: 0 %
Basophils Absolute: 0.1 10*3/uL (ref 0.0–0.1)
Basophils Relative: 1 %
Blasts: 0 %
Eosinophils Absolute: 0.4 10*3/uL (ref 0.0–0.5)
Eosinophils Relative: 3 %
Lymphocytes Relative: 47 %
Lymphs Abs: 5.9 10*3/uL — ABNORMAL HIGH (ref 0.7–4.0)
Metamyelocytes Relative: 0 %
Monocytes Absolute: 0.4 10*3/uL (ref 0.1–1.0)
Monocytes Relative: 3 %
Myelocytes: 0 %
Neutro Abs: 5.8 10*3/uL (ref 1.7–7.7)
Neutrophils Relative %: 46 %
Other: 0 %
Promyelocytes Relative: 0 %
nRBC: 0 /100 WBC

## 2022-05-27 LAB — CBC
HCT: 44.2 % (ref 39.0–52.0)
Hemoglobin: 14.9 g/dL (ref 13.0–17.0)
MCH: 29.5 pg (ref 26.0–34.0)
MCHC: 33.7 g/dL (ref 30.0–36.0)
MCV: 87.5 fL (ref 80.0–100.0)
Platelets: 320 10*3/uL (ref 150–400)
RBC: 5.05 MIL/uL (ref 4.22–5.81)
RDW: 12.3 % (ref 11.5–15.5)
WBC: 12.6 10*3/uL — ABNORMAL HIGH (ref 4.0–10.5)
nRBC: 0 % (ref 0.0–0.2)

## 2022-05-27 LAB — BASIC METABOLIC PANEL
Anion gap: 9 (ref 5–15)
BUN: 14 mg/dL (ref 6–20)
CO2: 24 mmol/L (ref 22–32)
Calcium: 9.2 mg/dL (ref 8.9–10.3)
Chloride: 103 mmol/L (ref 98–111)
Creatinine, Ser: 1.1 mg/dL (ref 0.61–1.24)
GFR, Estimated: >60 mL/min (ref >60)
Glucose, Bld: 135 mg/dL — ABNORMAL HIGH (ref 70–99)
Potassium: 4.1 mmol/L (ref 3.5–5.1)
Sodium: 136 mmol/L (ref 135–145)

## 2022-05-27 LAB — TROPONIN I (HIGH SENSITIVITY): Troponin I (High Sensitivity): <2 ng/L (ref <18)

## 2022-05-27 NOTE — ED Notes (Signed)
An After Visit Summary was printed and given to the patient. Discharge instructions given and no further questions at this time.  

## 2022-05-27 NOTE — ED Triage Notes (Signed)
Pt reports chest pain x 3 days. Reports pain has ben "bouncing around to different spots." Reports a sharp pain. Denies SHOB, N/V, lightheadedness. States he smokes weed but denies any other drug usage.

## 2022-05-27 NOTE — ED Provider Notes (Signed)
[facility]-EMERGENCY DEPT Provider Note   CSN: [id number] Arrival date & time: 05/27/22  1208     History  Chief Complaint  Patient presents with   Chest Pain    Terry [name] is a 51 y.o. male.   Chest Pain Patient with chest pain.  Has had for the last few days.  Comes and goes.  Sharp.  Sometimes right chest sometimes left.  Moves around some.  Not exertional.  Does not feel short of breath.  Does not last very long.  No fevers or chills.  No cough.  No swelling in his legs.  He is on blood thinners.    Past Medical History:  Diagnosis Date   Asthma    Cancer ([facility])    Diabetes mellitus without complication ([facility])    GERD (gastroesophageal reflux disease)    Hyperlipidemia    Hypertension    Opioid abuse ([facility])    following with methadone clinic   Pneumonia     Home Medications Prior to Admission medications   Medication Sig Start Date End Date Taking? Authorizing Provider  albuterol (VENTOLIN HFA) 108 (90 Base) MCG/ACT inhaler INHALE 1 TO 2 PUFFS INTO THE LUNGS EVERY 6 HOURS AS NEEDED FOR WHEEZING OR SHORTNESS OF BREATH 12/08/21   Koberlein, Paris Lore, MD  apixaban (ELIQUIS) 5 MG TABS tablet Take 1 tablet (5 mg total) by mouth 2 (two) times daily. 12/11/21   Orbie Pyo, MD  blood glucose meter kit and supplies KIT Dispense based on patient and insurance preference. Use up to four times daily as directed. 05/13/21   Wynn Banker, MD  Blood Pressure Monitoring (COMFORT TOUCH BP CUFF/LARGE) MISC 1 each by Does not apply route daily as needed. 05/13/21   Koberlein, Paris Lore, MD  lisinopril (ZESTRIL) 20 MG tablet Take 1 tablet (20 mg total) by mouth daily. 12/02/21   Wynn Banker, MD  Magnesium 400 MG TABS Take 400 mg by mouth daily. 10/22/20   Wynn Banker, MD  metFORMIN (GLUCOPHAGE-XR) 500 MG 24 hr tablet Take 1 tablet (500 mg total) by mouth daily with breakfast. 03/01/22   Karie Georges, MD  methadone (DOLOPHINE) 10  MG/ML solution Take 85 mg by mouth daily.     [provider]  multivitamin-iron-minerals-folic acid (CENTRUM) chewable tablet Chew 1 tablet by mouth daily.    [provider]  Polyethylene Glycol 3350 (MIRALAX PO) Take 238 g by mouth.    [provider]  rosuvastatin (CRESTOR) 10 MG tablet Take 1 tablet (10 mg total) by mouth daily. 05/14/21   Koberlein, Paris Lore, MD  Semaglutide, 1 MG/DOSE, 4 MG/3ML SOPN Inject 1 mg as directed once a week. 05/24/22   Karie Georges, MD  sildenafil (VIAGRA) 100 MG tablet TAKE 1/2 TABLET(50 MG) BY MOUTH DAILY AS NEEDED FOR ERECTILE DYSFUNCTION 10/14/21   Wynn Banker, MD  Spacer/Aero-Holding Chambers (E-Z SPACER) inhaler Use as instructed 10/13/18   Wynn Banker, MD  Spacer/Aero-Holding Deretha Emory DEVI 1 Device by Does not apply route daily as needed. 09/15/20   Wynn Banker, MD  tamsulosin (FLOMAX) 0.4 MG CAPS capsule Take 1 capsule (0.4 mg total) by mouth daily. 05/13/21   Wynn Banker, MD  Testosterone Enanthate (XYOSTED) 100 MG/0.5ML SOAJ Inject 100 mg into the skin once a week. 11/25/21   Wynn Banker, MD  valACYclovir (VALTREX) 1000 MG tablet Take 2 tablets PO BID x 1 day at onset of symptoms. 05/24/22  Farrel Conners, MD      Allergies    Patient has no known allergies.    Review of Systems   Review of Systems  Cardiovascular:  Positive for chest pain.    Physical Exam Updated Vital Signs BP 110/74   Pulse 73   Temp 98.3 F (36.8 C)   Resp 18   Ht $R'5\' 9"'BS$  (1.753 m)   Wt 95.3 kg   SpO2 98%   BMI 31.01 kg/m  Physical Exam Vitals and nursing note reviewed.  Constitutional:      Appearance: He is well-developed.  Cardiovascular:     Rate and Rhythm: Regular rhythm.  Pulmonary:     Breath sounds: No decreased breath sounds, wheezing or rhonchi.  Abdominal:     Tenderness: There is no abdominal tenderness.  Musculoskeletal:     Right lower leg: No edema.     Left lower leg:  No edema.  Neurological:     Mental Status: He is alert.     ED Results / Procedures / Treatments   Labs (all labs ordered are listed, but only abnormal results are displayed) Labs Reviewed  BASIC METABOLIC PANEL - Abnormal; Notable for the following components:      Result Value   Glucose, Bld 135 (*)    All other components within normal limits  CBC - Abnormal; Notable for the following components:   WBC 12.6 (*)    All other components within normal limits  DIFFERENTIAL  TROPONIN I (HIGH SENSITIVITY)    EKG EKG Interpretation  Date/Time:  Thursday May 27 2022 12:16:29 EDT Ventricular Rate:  95 PR Interval:  134 QRS Duration: 86 QT Interval:  337 QTC Calculation: 424 R Axis:   91 Text Interpretation: Sinus tachycardia Atrial premature complexes Borderline right axis deviation Low voltage, precordial leads No significant change since last tracing Confirmed by Davonna Belling [id number]) on 05/27/2022 2:19:33 PM  Radiology DG Chest 2 View  Result Date: 05/27/2022 CLINICAL DATA:  Chest pain EXAM: CHEST - 2 VIEW COMPARISON:  09/15/2020 FINDINGS: Lungs are well expanded, symmetric, and clear. No pneumothorax or pleural effusion. Cardiac size within normal limits. Pulmonary vascularity is normal. Osseous structures are age-appropriate. No acute bone abnormality. IMPRESSION: No active cardiopulmonary disease. Electronically Signed   By: Fidela Salisbury M.D.   On: 05/27/2022 13:22    Procedures Procedures    Medications Ordered in ED Medications - No data to display  ED Course/ Medical Decision Making/ A&P                           Medical Decision Making Amount and/or Complexity of Data Reviewed Labs: ordered. Radiology: ordered.   Patient with chest pain.  Sharp.  Comes and goes.  Anterior chest.  Not exertional.  No fevers.  No cough.  EKG reassuring.  Doubt cardiac ischemia.  Doubt pulm embolism.  X-ray reassuring.  Do not see mass..  Stable for discharge home.   Does have a history of CLL and I have reviewed previous oncology notes.        Final Clinical Impression(s) / ED Diagnoses Final diagnoses:  Nonspecific chest pain    Rx / DC Orders ED Discharge Orders     None         Davonna Belling, MD 05/27/22 1544

## 2022-05-27 NOTE — ED Provider Triage Note (Signed)
Emergency Medicine Provider Triage Evaluation Note  Terry [name] , a 51 y.o. male  was evaluated in triage.  Pt complains of chest pain for the past 4 to 5 days.  States it is worse in the evenings.  Not pleuritic in nature.  Denies shortness of breath, cough, or fever.  Denies palpitations.  Does have history of A-fib and reports compliance with Eliquis.  Review of Systems  Positive: As above Negative: As above  Physical Exam  BP (!) 175/77   Pulse 79   Temp 98.3 F (36.8 C)   Resp 15   Ht '5\' 9"'$  (1.753 m)   Wt 95.3 kg   SpO2 97%   BMI 31.01 kg/m  Gen:   Awake, no distress   Resp:  Normal effort  MSK:   Moves extremities without difficulty  Other:    Medical Decision Making  Medically screening exam initiated at 12:28 PM.  Appropriate orders placed.  Terry [name] was informed that the remainder of the evaluation will be completed by another provider, this initial triage assessment does not replace that evaluation, and the importance of remaining in the ED until their evaluation is complete.     Evlyn Courier, PA-C 05/27/22 1228

## 2022-06-07 ENCOUNTER — Other Ambulatory Visit: Payer: Self-pay | Admitting: *Deleted

## 2022-06-08 MED ORDER — ROSUVASTATIN CALCIUM 10 MG PO TABS: 10.0000 mg | ORAL_TABLET | Freq: Every day | ORAL | 0 refills | Status: DC

## 2022-06-08 MED ORDER — TAMSULOSIN HCL 0.4 MG PO CAPS: 0.4000 mg | ORAL_CAPSULE | Freq: Every day | ORAL | 0 refills | Status: DC

## 2022-06-08 NOTE — Telephone Encounter (Signed)
Ok to fill but it looks like he needs to come in for a TOC visit.

## 2022-07-05 ENCOUNTER — Inpatient Hospital Stay: Payer: BC Managed Care – PPO | Attending: Hematology and Oncology

## 2022-07-05 ENCOUNTER — Inpatient Hospital Stay (HOSPITAL_BASED_OUTPATIENT_CLINIC_OR_DEPARTMENT_OTHER): Payer: BC Managed Care – PPO | Admitting: Hematology and Oncology

## 2022-07-05 ENCOUNTER — Other Ambulatory Visit: Payer: Self-pay

## 2022-07-05 ENCOUNTER — Other Ambulatory Visit: Payer: Self-pay | Admitting: Hematology and Oncology

## 2022-07-05 VITALS — BP 133/82 | HR 77 | Temp 97.5°F | Resp 14 | Wt 210.8 lb

## 2022-07-05 DIAGNOSIS — Z7901 Long term (current) use of anticoagulants: Secondary | ICD-10-CM | POA: Insufficient documentation

## 2022-07-05 DIAGNOSIS — C9110 Chronic lymphocytic leukemia of B-cell type not having achieved remission: Secondary | ICD-10-CM

## 2022-07-05 DIAGNOSIS — Z79624 Long term (current) use of inhibitors of nucleotide synthesis: Secondary | ICD-10-CM | POA: Insufficient documentation

## 2022-07-05 DIAGNOSIS — Z79899 Other long term (current) drug therapy: Secondary | ICD-10-CM | POA: Diagnosis not present

## 2022-07-05 DIAGNOSIS — D72820 Lymphocytosis (symptomatic): Secondary | ICD-10-CM | POA: Diagnosis not present

## 2022-07-05 DIAGNOSIS — I4891 Unspecified atrial fibrillation: Secondary | ICD-10-CM | POA: Diagnosis not present

## 2022-07-05 LAB — CMP (CANCER CENTER ONLY)
ALT: 19 U/L (ref 0–44)
AST: 18 U/L (ref 15–41)
Albumin: 4.6 g/dL (ref 3.5–5.0)
Alkaline Phosphatase: 57 U/L (ref 38–126)
Anion gap: 5 (ref 5–15)
BUN: 20 mg/dL (ref 6–20)
CO2: 28 mmol/L (ref 22–32)
Calcium: 9.7 mg/dL (ref 8.9–10.3)
Chloride: 105 mmol/L (ref 98–111)
Creatinine: 1.28 mg/dL — ABNORMAL HIGH (ref 0.61–1.24)
GFR, Estimated: >60 mL/min (ref >60)
Glucose, Bld: 112 mg/dL — ABNORMAL HIGH (ref 70–99)
Potassium: 4.1 mmol/L (ref 3.5–5.1)
Sodium: 138 mmol/L (ref 135–145)
Total Bilirubin: 0.9 mg/dL (ref 0.3–1.2)
Total Protein: 7.3 g/dL (ref 6.5–8.1)

## 2022-07-05 LAB — CBC WITH DIFFERENTIAL (CANCER CENTER ONLY)
Abs Immature Granulocytes: 0.02 10*3/uL (ref 0.00–0.07)
Basophils Absolute: 0.1 10*3/uL (ref 0.0–0.1)
Basophils Relative: 1 %
Eosinophils Absolute: 0.3 10*3/uL (ref 0.0–0.5)
Eosinophils Relative: 2 %
HCT: 42.7 % (ref 39.0–52.0)
Hemoglobin: 14.6 g/dL (ref 13.0–17.0)
Immature Granulocytes: 0 %
Lymphocytes Relative: 32 %
Lymphs Abs: 3.9 10*3/uL (ref 0.7–4.0)
MCH: 29.9 pg (ref 26.0–34.0)
MCHC: 34.2 g/dL (ref 30.0–36.0)
MCV: 87.5 fL (ref 80.0–100.0)
Monocytes Absolute: 0.5 10*3/uL (ref 0.1–1.0)
Monocytes Relative: 5 %
Neutro Abs: 7.2 10*3/uL (ref 1.7–7.7)
Neutrophils Relative %: 60 %
Platelet Count: 293 10*3/uL (ref 150–400)
RBC: 4.88 MIL/uL (ref 4.22–5.81)
RDW: 12 % (ref 11.5–15.5)
WBC Count: 12.1 10*3/uL — ABNORMAL HIGH (ref 4.0–10.5)
nRBC: 0 % (ref 0.0–0.2)

## 2022-07-05 LAB — LACTATE DEHYDROGENASE: LDH: 130 U/L (ref 98–192)

## 2022-07-05 NOTE — Progress Notes (Signed)
[place] [phone]   [phone]  PROGRESS NOTE  Patient Care Team: Terry Terry [name], Terry Terry [name] (Inactive) as PCP - General (Family Medicine)  Hematological/Oncological History #Chronic Lymphocytic Leukemia (17p13 deletion). Rai Stage 0  1) 09/26/2017: WBC 7.7, Hgb 13.1, Plt 247. MCV 84.2. ANC 8600, ALC 1500 2) 10/13/2018: WBC 11.9, Hgb 13.8, Plt 342. MCV 84.7. [place] 5200, ALC 5000 3) 07/11/2019: WBC 11.8, Hgb 14.2, Plt 357, MCV 86.1. [place] 5300, ALC 5200 4) 07/25/2019: establish care with Dr. Lorenso Terry [name]. Flow cytometry showed monoclonal B-cell population without expression of CD5 or CD10 comprises 59% of all lymphocytes. 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  5) 08/20/2019: CLL panel returned positive for 17p13 deletion and no evidence of t(11:14). Findings consistent with diagnosis of CLL. Entered in active surveillance.   Interval History:  Terry Terry [name] 51 y.o. male with medical history significant for CLL Rai Stage 0 presents for a follow up visit. The patient's last visit was on 01/04/2022. In the interim since the last visit he has had no major changes in his health.   On exam today Terry Terry [name] reports he quit taking his Ozempic in the interim since her last visit because it was causing nausea and vomiting.  He reports that it is mostly because nausea throughout the mornings.  His weight has declined steadily from 240 pounds down to 210 pounds today.  He reports that despite having nausea and vomiting he does not have any diarrhea.  He does have some "migrating chest pain" which Terry [name] and goes.  He does follow with cardiology for his atrial fibrillation and is being evaluated by them for his chest pain.  He reports that his goal is to get down to 200 pounds.  He otherwise denies having issues with fevers, chills, sweats, nausea, vomiting or diarrhea.  He denies having any palpable lymphadenopathy.  A full 10 point ROS is listed below.  MEDICAL  HISTORY:  Past Medical History:  Diagnosis Date   Asthma    Cancer ([place])    Diabetes mellitus without complication ([place])    GERD (gastroesophageal reflux disease)    Hyperlipidemia    Hypertension    Opioid abuse ([place])    following with methadone clinic   Pneumonia     SURGICAL HISTORY: Past Surgical History:  Procedure Laterality Date   right bicep Right 2013   TRICEPS TENDON REPAIR Left 04/2013   ALLERGIES:  has No Known Allergies.  MEDICATIONS:  Current Outpatient Medications  Medication Sig Dispense Refill   albuterol (VENTOLIN HFA) 108 (90 Base) MCG/ACT inhaler INHALE 1 TO 2 PUFFS INTO THE LUNGS EVERY 6 HOURS AS NEEDED FOR WHEEZING OR SHORTNESS OF BREATH 20.1 g 1   apixaban (ELIQUIS) 5 MG TABS tablet Take 1 tablet (5 mg total) by mouth 2 (two) times daily. 60 tablet 5   blood glucose meter kit and supplies KIT Dispense based on patient and insurance preference. Use up to four times daily as directed. 1 each 0   Blood Pressure Monitoring (COMFORT TOUCH BP CUFF/LARGE) MISC 1 each by Does not apply route daily as needed. 1 each 0   lisinopril (ZESTRIL) 20 MG tablet Take 1 tablet (20 mg total) by mouth daily. 90 tablet 3   Magnesium 400 MG TABS Take 400 mg by mouth daily.     metFORMIN (GLUCOPHAGE-XR) 500 MG 24 hr tablet Take 1 tablet (500 mg total) by mouth daily with breakfast. 90 tablet 0   methadone (DOLOPHINE) 10  MG/ML solution Take 85 mg by mouth daily.      multivitamin-iron-minerals-folic acid (CENTRUM) chewable tablet Chew 1 tablet by mouth daily.     Polyethylene Glycol 3350 (MIRALAX PO) Take 238 g by mouth.     rosuvastatin (CRESTOR) 10 MG tablet Take 1 tablet (10 mg total) by mouth daily. 30 tablet 0   Semaglutide, 1 MG/DOSE, 4 MG/3ML SOPN Inject 1 mg as directed once a week. 9 mL 0   sildenafil (VIAGRA) 100 MG tablet TAKE 1/2 TABLET(50 MG) BY MOUTH DAILY AS NEEDED FOR ERECTILE DYSFUNCTION 30 tablet 0   Spacer/Aero-Holding Chambers (E-Z SPACER) inhaler Use as  instructed 1 each 2   Spacer/Aero-Holding Chambers DEVI 1 Device by Does not apply route daily as needed. 1 each 0   tamsulosin (FLOMAX) 0.4 MG CAPS capsule Take 1 capsule (0.4 mg total) by mouth daily. 30 capsule 0   Testosterone Enanthate (XYOSTED) 100 MG/0.5ML SOAJ Inject 100 mg into the skin once a week. 6 mL 2   valACYclovir (VALTREX) 1000 MG tablet Take 2 tablets PO BID x 1 day at onset of symptoms. 30 tablet 0   No current facility-administered medications for this visit.    REVIEW OF SYSTEMS:   Constitutional: ( - ) fevers, ( - )  chills , ( - ) night sweats Eyes: ( - ) blurriness of vision, ( - ) double vision, ( - ) watery eyes Ears, nose, mouth, throat, and face: ( - ) mucositis, ( - ) sore throat Respiratory: ( - ) cough, ( - ) dyspnea, ( - ) wheezes Cardiovascular: ( - ) palpitation, ( - ) chest discomfort, ( - ) lower extremity swelling Gastrointestinal:  ( - ) nausea, ( - ) heartburn, ( - ) change in bowel habits Skin: ( - ) abnormal skin rashes Lymphatics: ( - ) new lymphadenopathy, ( - ) easy bruising Neurological: ( - ) numbness, ( - ) tingling, ( - ) new weaknesses Behavioral/Psych: ( - ) mood change, ( - ) new changes  All other systems were reviewed with the patient and are negative.  PHYSICAL EXAMINATION: ECOG PERFORMANCE STATUS: 0 - Asymptomatic  Vitals:   07/05/22 1145  BP: 133/82  Pulse: 77  Resp: 14  Temp: (!) 97.5 F (36.4 C)  SpO2: 97%     Filed Weights   07/05/22 1145  Weight: 210 lb 12.8 oz (95.6 kg)      GENERAL: well appearing middle aged male in NAD  SKIN: skin color, texture, turgor are normal, no rashes or significant lesions EYES: conjunctiva are pink and non-injected, sclera clear NECK: supple, non-tender  LYMPH:  no palpable lymphadenopathy in the cervical, axillary or supraclavicular lymph nodes.  LUNGS: clear to auscultation and percussion with normal breathing effort HEART: regular rate & rhythm and no murmurs and +1 bilateral  lower extremity edema ABDOMEN: limited due to body habitus.  Musculoskeletal: no cyanosis of digits and no clubbing  PSYCH: alert & oriented x 3, fluent speech NEURO: no focal motor/sensory deficits  LABORATORY DATA:  I have reviewed the data as listed    Latest Ref Rng & Units 07/05/2022   11:13 AM 05/27/2022   12:53 PM 01/04/2022   11:18 AM  CBC  WBC 4.0 - 10.5 K/uL 12.1  12.6  14.3   Hemoglobin 13.0 - 17.0 g/dL 14.6  14.9  16.9   Hematocrit 39.0 - 52.0 % 42.7  44.2  50.7   Platelets 150 - 400 K/uL 293  320  279        Latest Ref Rng & Units 07/05/2022   11:13 AM 05/27/2022   12:53 PM 01/04/2022   11:18 AM  CMP  Glucose 70 - 99 mg/dL 112  135  103   BUN 6 - 20 mg/dL _0 Creatinine 0.61 - 1.24 mg/dL 1.28  1.10  1.27   Sodium 135 - 145 mmol/L 138  136  135   Potassium 3.5 - 5.1 mmol/L 4.1  4.1  4.7   Chloride 98 - 111 mmol/L 105  103  99   CO2 22 - 32 mmol/L _1 Calcium 8.9 - 10.3 mg/dL 9.7  9.2  9.8   Total Protein 6.5 - 8.1 g/dL 7.3   7.1   Total Bilirubin 0.3 - 1.2 mg/dL 0.9   0.9   Alkaline Phos 38 - 126 U/L 57   68   AST 15 - 41 U/L 18   11   ALT 0 - 44 U/L 19   15     BLOOD FILM: None today.   RADIOGRAPHIC STUDIES: I have personally reviewed the radiological images as listed and agreed with the findings in the report: no evidence of lymphadenopathy or splenomegaly.   No results found.  ASSESSMENT & PLAN Gianlucas Evenson 51 y.o. male with medical history significant for CLL Rai Stage 0 presents for a follow up visit. The patient's last visit was on 07/25/2019 at which time he established care in our clinic.  After review of the imaging and the lab work the patient's findings are most consistent with CLL.  Given that he only has a lymphocytosis this would be considered a CLL Rai stage 0.  There is no clear indication for treatment at this time and I would recommend that he enter into active surveillance with close clinical monitoring and strict  return precautions.  Today Terry Terry [name] notes he has been well in the interim since his last visit. He was recently diagnosed with atrial fibrillation and started on Eliquis, but has been in his normal state of health otherwise. He is asymptomatic with no unintentional weight loss, fevers, chills, sweats, or new lymphadenopathy.   Previously we discussed the nature of CLL, the relatively good prognosis, the meaning of his a 17p13 deletion, and possible treatments moving forward.  We also discussed that this tends to be a more indolent cancer and that close monitoring which is appropriate at this time.  We also discussed the criteria for which we would want to start treatment including bulky lymphadenopathy, anemia, or thrombocytopenia.  Mr. Henard and his wife expressed their understanding of the disease and the plan moving forward.  In the event treatment is required could consider therapy with ibrutinib +/- rituximab, Acalabrutinib +/- obinutuzumab, or Venetoclax +obinutuzumab. Of these treatment options only Venetoclax/obinutuzumab would be of limited duration (12 months). We can discuss these treatments in detail when the time Terry [name].   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 Will continue to monitor in clinic with serial visits and blood work to assess for progression of the disease.  -- Today labs show white blood cell count 12.1, hemoglobin 14.6, MCV 87.5, and platelets of 293 --plan for labs q6 months with CBC, CMP, LDH --Plan to have patient return in 12 months with PRN visits if new symptoms were to develop.   #Atrial Fibrillation --currently on eliquis therapy 80m  BID  No orders of the defined types were placed in this encounter.   All questions were answered. The patient knows to call the clinic with any problems, questions or concerns.  A total of more than 30 minutes were spent on this encounter and over half of that  time was spent on counseling and coordination of care as outlined above.   Terry Peoples, Terry Terry [name] Department of Hematology/Oncology [place] [facility] Phone: [phone] Pager: [phone] Email: [email or web address]  07/05/2022 4:33 PM

## 2022-08-03 ENCOUNTER — Other Ambulatory Visit: Payer: Self-pay | Admitting: Family Medicine

## 2022-08-03 DIAGNOSIS — E1165 Type 2 diabetes mellitus with hyperglycemia: Secondary | ICD-10-CM

## 2022-08-04 NOTE — Telephone Encounter (Signed)
Ok to fill but he needs an appt to establish with me

## 2022-08-13 ENCOUNTER — Other Ambulatory Visit: Payer: Self-pay | Admitting: Internal Medicine

## 2022-08-13 NOTE — Telephone Encounter (Signed)
Prescription refill request for Eliquis received. Indication:afib Last office visit:5/23 Scr:1.2 Age: 52 Weight:95.6  kg  Prescription refilled

## 2022-08-25 ENCOUNTER — Telehealth: Payer: Self-pay | Admitting: Pharmacist

## 2022-08-25 NOTE — Telephone Encounter (Signed)
Received message from pt's insurance that Eliquis does not need prior authorization. Not sure why pharmacy sent [place] this request. Will forward message to pharmacy that nothing is needed from our end and they should be able to process rx like normal.

## 2022-08-25 NOTE — Telephone Encounter (Signed)
Received fax from [facility] that prior auth is needed for Eliquis which seems unusual. This has been submitted, Key: [id number].

## 2022-09-07 ENCOUNTER — Emergency Department (HOSPITAL_COMMUNITY): Payer: BC Managed Care – PPO

## 2022-09-07 ENCOUNTER — Encounter (HOSPITAL_COMMUNITY): Payer: Self-pay

## 2022-09-07 ENCOUNTER — Other Ambulatory Visit: Payer: Self-pay

## 2022-09-07 ENCOUNTER — Emergency Department (HOSPITAL_COMMUNITY)
Admission: EM | Admit: 2022-09-07 | Discharge: 2022-09-07 | Discharge status: Against Medical Advice | Payer: BC Managed Care – PPO | Attending: Emergency Medicine | Admitting: Emergency Medicine

## 2022-09-07 DIAGNOSIS — R079 Chest pain, unspecified: Secondary | ICD-10-CM | POA: Insufficient documentation

## 2022-09-07 DIAGNOSIS — Z5321 Procedure and treatment not carried out due to patient leaving prior to being seen by health care provider: Secondary | ICD-10-CM | POA: Diagnosis not present

## 2022-09-07 DIAGNOSIS — Z7901 Long term (current) use of anticoagulants: Secondary | ICD-10-CM | POA: Insufficient documentation

## 2022-09-07 LAB — CBC
HCT: 48.9 % (ref 39.0–52.0)
Hemoglobin: 16.5 g/dL (ref 13.0–17.0)
MCH: 29.2 pg (ref 26.0–34.0)
MCHC: 33.7 g/dL (ref 30.0–36.0)
MCV: 86.4 fL (ref 80.0–100.0)
Platelets: 304 10*3/uL (ref 150–400)
RBC: 5.66 MIL/uL (ref 4.22–5.81)
RDW: 11.5 % (ref 11.5–15.5)
WBC: 13.9 10*3/uL — ABNORMAL HIGH (ref 4.0–10.5)
nRBC: 0 % (ref 0.0–0.2)

## 2022-09-07 LAB — BASIC METABOLIC PANEL
Anion gap: 12 (ref 5–15)
BUN: 20 mg/dL (ref 6–20)
CO2: 22 mmol/L (ref 22–32)
Calcium: 9.1 mg/dL (ref 8.9–10.3)
Chloride: 99 mmol/L (ref 98–111)
Creatinine, Ser: 1.26 mg/dL — ABNORMAL HIGH (ref 0.61–1.24)
GFR, Estimated: >60 mL/min (ref >60)
Glucose, Bld: 275 mg/dL — ABNORMAL HIGH (ref 70–99)
Potassium: 3.8 mmol/L (ref 3.5–5.1)
Sodium: 133 mmol/L — ABNORMAL LOW (ref 135–145)

## 2022-09-07 LAB — TROPONIN I (HIGH SENSITIVITY): Troponin I (High Sensitivity): 3 ng/L (ref <18)

## 2022-09-07 NOTE — ED Triage Notes (Signed)
Woke up this morning with right sided chest pains with no radiation.   Pain worse with movement. Denies shob or cough.   Takes Eliquis BID. Says he may have missed one of his doses yesterday.

## 2022-09-07 NOTE — ED Notes (Signed)
Notified of ready room for placement in ER.   Pt sts he feels improvement. Was just seeking EKG and initial troponin. Says he will follow up with PCP tomorrow and return with worsening symptoms.

## 2022-09-07 NOTE — ED Provider Triage Note (Signed)
Emergency Medicine Provider Triage Evaluation Note  Terry [name] , a 52 y.o. male  was evaluated in triage.  Pt complains of right-sided chest pain.  States he noticed it when he woke up this morning.  It is worse with exertion.  Describes it as a mild to moderate sensation.  It is sharp.  Does not radiate.  Denies shortness of breath or cough.  He does take Eliquis for A-fib.  Missed a dose yesterday.  Review of Systems  Positive: As above Negative: As above  Physical Exam  BP (!) 154/98 (BP Location: Left Arm)   Pulse 90   Temp 97.6 F (36.4 C) (Oral)   Resp 18   SpO2 98%  Gen:   Awake, no distress  Resp:  Normal effort  MSK:   Moves extremities without difficulty  Other:    Medical Decision Making  Medically screening exam initiated at 7:52 PM.  Appropriate orders placed.  Terry [name] was informed that the remainder of the evaluation will be completed by another provider, this initial triage assessment does not replace that evaluation, and the importance of remaining in the ED until their evaluation is complete.  ACS workup initiated   Terry [name] 09/07/22 3220

## 2022-09-30 ENCOUNTER — Other Ambulatory Visit: Payer: Self-pay | Admitting: Family Medicine

## 2022-09-30 DIAGNOSIS — E1165 Type 2 diabetes mellitus with hyperglycemia: Secondary | ICD-10-CM

## 2022-11-09 ENCOUNTER — Ambulatory Visit (INDEPENDENT_AMBULATORY_CARE_PROVIDER_SITE_OTHER): Payer: BC Managed Care – PPO | Admitting: Family Medicine

## 2022-11-09 ENCOUNTER — Encounter: Payer: Self-pay | Admitting: Family Medicine

## 2022-11-09 VITALS — BP 140/60 | HR 110 | Temp 98.3°F | Ht 69.0 in | Wt 217.0 lb

## 2022-11-09 DIAGNOSIS — R7989 Other specified abnormal findings of blood chemistry: Secondary | ICD-10-CM

## 2022-11-09 DIAGNOSIS — E7800 Pure hypercholesterolemia, unspecified: Secondary | ICD-10-CM

## 2022-11-09 DIAGNOSIS — I10 Essential (primary) hypertension: Secondary | ICD-10-CM | POA: Diagnosis not present

## 2022-11-09 DIAGNOSIS — Z1211 Encounter for screening for malignant neoplasm of colon: Secondary | ICD-10-CM

## 2022-11-09 DIAGNOSIS — E1165 Type 2 diabetes mellitus with hyperglycemia: Secondary | ICD-10-CM | POA: Diagnosis not present

## 2022-11-09 LAB — POCT GLYCOSYLATED HEMOGLOBIN (HGB A1C): Hemoglobin A1C: 6.4 % — AB (ref 4.0–5.6)

## 2022-11-09 MED ORDER — XYOSTED 100 MG/0.5ML ~~LOC~~ SOAJ
100.0000 mg | SUBCUTANEOUS | 2 refills | Status: DC
Start: 2022-11-09 — End: 2023-02-08

## 2022-11-09 MED ORDER — ROSUVASTATIN CALCIUM 10 MG PO TABS: ORAL_TABLET | ORAL | 1 refills | Status: DC

## 2022-11-09 MED ORDER — SEMAGLUTIDE (1 MG/DOSE) 4 MG/3ML ~~LOC~~ SOPN
1.0000 mg | PEN_INJECTOR | SUBCUTANEOUS | 3 refills | Status: DC
Start: 2022-11-09 — End: 2023-10-07

## 2022-11-09 MED ORDER — LISINOPRIL 20 MG PO TABS: 20.0000 mg | ORAL_TABLET | Freq: Every day | ORAL | 1 refills | Status: DC

## 2022-11-09 NOTE — Progress Notes (Unsigned)
Established Patient Office Visit  Subjective   Patient ID: Terry [name], male    DOB: 01-24-1971  Age: 52 y.o. MRN: [id number]  Chief Complaint  Patient presents with   Establish Care    Pt is here for follow up.  HTN-- pt states he has been having issues with his blood pressure at home. States his BP at home was sometimes 200/100. This has been going on for about   He is reporting he went to the ER twice with atypical chest pain, states that he has been getting "pressure points" in his chest and he was worried about his heart   DM-- pt currently on ozempic 1 mg weekly and metformin, states he ran out of his medication and needs refills. States that he isn't really checking his blood sugars at home.    Current Outpatient Medications  Medication Instructions   albuterol (VENTOLIN HFA) 108 (90 Base) MCG/ACT inhaler INHALE 1 TO 2 PUFFS INTO THE LUNGS EVERY 6 HOURS AS NEEDED FOR WHEEZING OR SHORTNESS OF BREATH   blood glucose meter kit and supplies KIT Dispense based on patient and insurance preference. Use up to four times daily as directed.   Blood Pressure Monitoring (COMFORT TOUCH BP CUFF/LARGE) MISC 1 each, Does not apply, Daily PRN   ELIQUIS 5 MG TABS tablet TAKE 1 TABLET(5 MG) BY MOUTH TWICE DAILY   lisinopril (ZESTRIL) 20 mg, Oral, Daily   Magnesium 400 mg, Oral, Daily   metFORMIN (GLUCOPHAGE-XR) 500 MG 24 hr tablet TAKE 1 TABLET DAILY WITH BREAKFAST (LAST REFILL NEEDS AN APPOINTMENT WITH NEW PRIMARY CARE PHYSICIAN)   methadone (DOLOPHINE) 85 mg, Oral, Daily   multivitamin-iron-minerals-folic acid (CENTRUM) chewable tablet 1 tablet, Oral, Daily   Polyethylene Glycol 3350 (MIRALAX PO) 238 g, Oral   rosuvastatin (CRESTOR) 10 MG tablet TAKE 1 TABLET DAILY (LAST REFILL, NEED APPOINTMENT WITH NEW PRIMARY CARE PHYSICIAN)   Semaglutide (1 MG/DOSE) 1 mg, Injection, Weekly   sildenafil (VIAGRA) 100 MG tablet TAKE 1/2 TABLET(50 MG) BY MOUTH DAILY AS NEEDED FOR ERECTILE  DYSFUNCTION   Spacer/Aero-Holding Chambers (E-Z SPACER) inhaler Use as instructed   Spacer/Aero-Holding Chambers DEVI 1 Device, Does not apply, Daily PRN   tamsulosin (FLOMAX) 0.4 MG CAPS capsule TAKE 1 CAPSULE DAILY (LAST REFILL, NEED APPOINTMENT WITH NEW PRIMARY CARE PHYSICIAN)   valACYclovir (VALTREX) 1000 MG tablet Take 2 tablets PO BID x 1 day at onset of symptoms.   Xyosted 100 mg, Subcutaneous, Weekly    Patient Active Problem List   Diagnosis Date Noted   Recurrent circadian rhythm sleep disorder, shift work type 12/15/2021   Paroxysmal atrial fibrillation 12/15/2021   Class 2 drug-induced obesity with serious comorbidity and body mass index (BMI) of 36.0 to 36.9 in adult 12/15/2021   Type 2 diabetes mellitus without complication, without long-term current use of insulin 12/15/2021   At risk for central sleep apnea 12/15/2021   Non-restorative sleep 12/15/2021   Diabetes mellitus type II, controlled 12/02/2021   CLL (chronic lymphocytic leukemia) 12/06/2019   Opioid dependence 07/12/2019   Restless leg syndrome 07/03/2019   Chronic insomnia 07/03/2019   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     Objective:  BP (!) 140/60 (BP Location: Right Arm, Patient Position: Sitting, Cuff Size: Large)   Pulse (!) 110   Temp 98.3 F (36.8 C) (Oral)   Ht 5\' 9"  (1.753 m)   Wt 217 lb (98.4 kg)   SpO2 97%   BMI 32.05 kg/m  {Vitals History (Optional):23777}  Physical Exam Vitals reviewed.  Constitutional:      Appearance: Normal appearance. He is well-groomed. He is obese.  Eyes:     Extraocular Movements: Extraocular movements intact.      Conjunctiva/sclera: Conjunctivae normal.  Neck:     Thyroid: No thyromegaly.  Cardiovascular:     Rate and Rhythm: Normal rate and regular rhythm.     Heart sounds: S1 normal and S2 normal. No murmur heard. Pulmonary:     Effort: Pulmonary effort is normal.     Breath sounds: Normal breath sounds and air entry. No rales.  Abdominal:     General: Abdomen is flat. Bowel sounds are normal.  Musculoskeletal:     Right lower leg: No edema.     Left lower leg: No edema.  Neurological:     General: No focal deficit present.     Mental Status: He is alert and oriented to person, place, and time.     Gait: Gait is intact.  Psychiatric:        Mood and Affect: Affect normal. Mood is anxious.      Results for orders placed or performed in visit on 11/09/22  POC HgB A1c  Result Value Ref Range   Hemoglobin A1C 6.4 (A) 4.0 - 5.6 %   HbA1c POC (<> result, manual entry)     HbA1c, POC (prediabetic range)     HbA1c, POC (controlled diabetic range)      {Labs (Optional):23779}  The 10-year ASCVD risk score (Arnett DK, et al., 2019) is: 7.4%    Assessment & Plan:   Problem List Items Addressed This Visit       Unprioritized   HTN (hypertension)   Relevant Medications   lisinopril (ZESTRIL) 20 MG tablet   rosuvastatin (CRESTOR) 10 MG tablet   Other Relevant Orders   Lipid Panel   Pure hypercholesterolemia   Relevant Medications   lisinopril (ZESTRIL) 20 MG tablet   rosuvastatin (CRESTOR) 10 MG tablet   Diabetes mellitus type II, controlled - Primary   Relevant Medications   Semaglutide, 1 MG/DOSE, 4 MG/3ML SOPN   lisinopril (ZESTRIL) 20 MG tablet   rosuvastatin (CRESTOR) 10 MG tablet   Other Relevant Orders   POC HgB A1c (Completed)   Microalbumin/Creatinine Ratio, Urine   Other Visit Diagnoses     Low testosterone in male       Relevant Medications   Testosterone Enanthate (XYOSTED) 100 MG/0.5ML SOAJ   Other Relevant Orders   Testosterone   Colon cancer screening        Relevant Orders   Cologuard       Return in about 3 months (around 02/08/2023) for HTN.    Karie Georges, MD

## 2022-11-09 NOTE — Patient Instructions (Signed)
Call Dr. Lynnette Caffey and get an appointment to discuss either stress test or CT of the heart to look for any blockages in the arteries.

## 2022-11-10 DIAGNOSIS — R7989 Other specified abnormal findings of blood chemistry: Secondary | ICD-10-CM | POA: Insufficient documentation

## 2022-11-10 NOTE — Assessment & Plan Note (Addendum)
On crestor 10 mg daily. Will continue this medication as prescribed and check all new labs including CMP, lipid panel. We briefly discussed referring him to the cardiologist for a stress test or CT angio of the heart to rule out and CAD.

## 2022-11-10 NOTE — Assessment & Plan Note (Signed)
A1C is well controlled on the ozempic 1 mg weekly and metformin 500 mg daily. I recommended we continue this current regimen. He was advised that he needs an eye exam.

## 2022-11-10 NOTE — Assessment & Plan Note (Signed)
I rechecked his BP twice manually and with the machine, 104/72 was confirmed in both arms. He is a little tachycardic at rest, could be from nervousness/anxiety. I reassured the patient that his BP in office is good, I recommended we continue the 20 mg daily dose of lisinopril. I advised that he try getting a new BP machine at home to check his BP. We discussed reducing salt and trying to use relaxation techniques for his anxiety.

## 2022-11-10 NOTE — Assessment & Plan Note (Signed)
Pt has been getting testosterone 100 mg weekly injections, last filled in October according to his PDMP, pt states he has not missed any doses of this medication. Will check a new testosterone level.

## 2022-11-12 ENCOUNTER — Other Ambulatory Visit (INDEPENDENT_AMBULATORY_CARE_PROVIDER_SITE_OTHER): Payer: BC Managed Care – PPO

## 2022-11-12 DIAGNOSIS — I10 Essential (primary) hypertension: Secondary | ICD-10-CM | POA: Diagnosis not present

## 2022-11-12 DIAGNOSIS — E1165 Type 2 diabetes mellitus with hyperglycemia: Secondary | ICD-10-CM | POA: Diagnosis not present

## 2022-11-12 DIAGNOSIS — R7989 Other specified abnormal findings of blood chemistry: Secondary | ICD-10-CM

## 2022-11-12 LAB — LIPID PANEL
Cholesterol: 160 mg/dL (ref 0–200)
HDL: 46.3 mg/dL (ref >39.00)
LDL Cholesterol: 100 mg/dL — ABNORMAL HIGH (ref 0–99)
NonHDL: 114.11
Total CHOL/HDL Ratio: 3
Triglycerides: 70 mg/dL (ref 0.0–149.0)
VLDL: 14 mg/dL (ref 0.0–40.0)

## 2022-11-12 LAB — MICROALBUMIN / CREATININE URINE RATIO
Creatinine,U: 211.9 mg/dL
Microalb Creat Ratio: 0.3 mg/g (ref 0.0–30.0)
Microalb, Ur: 0.7 mg/dL (ref 0.0–1.9)

## 2022-11-12 LAB — TESTOSTERONE: Testosterone: 163.83 ng/dL — ABNORMAL LOW (ref 300.00–890.00)

## 2022-11-23 ENCOUNTER — Other Ambulatory Visit: Payer: Self-pay | Admitting: Family Medicine

## 2022-11-23 DIAGNOSIS — E1165 Type 2 diabetes mellitus with hyperglycemia: Secondary | ICD-10-CM

## 2023-01-04 ENCOUNTER — Ambulatory Visit: Payer: BC Managed Care – PPO | Attending: Nurse Practitioner | Admitting: Nurse Practitioner

## 2023-01-04 ENCOUNTER — Encounter: Payer: Self-pay | Admitting: Nurse Practitioner

## 2023-01-04 VITALS — BP 114/76 | HR 68 | Ht 69.0 in | Wt 213.0 lb

## 2023-01-04 DIAGNOSIS — E785 Hyperlipidemia, unspecified: Secondary | ICD-10-CM

## 2023-01-04 DIAGNOSIS — I480 Paroxysmal atrial fibrillation: Secondary | ICD-10-CM

## 2023-01-04 DIAGNOSIS — E1159 Type 2 diabetes mellitus with other circulatory complications: Secondary | ICD-10-CM

## 2023-01-04 DIAGNOSIS — Z7901 Long term (current) use of anticoagulants: Secondary | ICD-10-CM

## 2023-01-04 DIAGNOSIS — Z7984 Long term (current) use of oral hypoglycemic drugs: Secondary | ICD-10-CM

## 2023-01-04 DIAGNOSIS — R072 Precordial pain: Secondary | ICD-10-CM

## 2023-01-04 DIAGNOSIS — G4733 Obstructive sleep apnea (adult) (pediatric): Secondary | ICD-10-CM

## 2023-01-04 DIAGNOSIS — I700 Atherosclerosis of aorta: Secondary | ICD-10-CM

## 2023-01-04 DIAGNOSIS — E1169 Type 2 diabetes mellitus with other specified complication: Secondary | ICD-10-CM

## 2023-01-04 DIAGNOSIS — I152 Hypertension secondary to endocrine disorders: Secondary | ICD-10-CM

## 2023-01-04 MED ORDER — METOPROLOL TARTRATE 100 MG PO TABS: ORAL_TABLET | ORAL | 0 refills | Status: DC

## 2023-01-04 NOTE — Progress Notes (Signed)
Cardiology Office Note:    Date:  01/04/2023   ID:  Terry [name], DOB 14-Jun-1971, MRN [id number]  PCP:  Karie Georges, MD   [facility] Providers Cardiologist:  Orbie Pyo, MD     Referring MD: Karie Georges, MD   Chief Complaint: persistent atrial fibrillation  History of Present Illness:    Terry [name] is a very pleasant 52 y.o. male with a hx of atrial fibrillation on chronic anticoagulation, aortic atherosclerosis, type 2 diabetes, HTN, HLD, obesity, and CLL.   Referred to cardiology for management of A-fib and seen by Dr. Lynnette Caffey 12/03/21.  He works full-time as a Estate agent. He was overall doing well and plan was to get echo and continue current therapies with plan to return in 1 year for follow-up.   2D echo 12/21/2021 revealed normal LVEF 60 to 65%, G1 DD, mildly dilated LA, moderately dilated RA but no significant valve disease.  Today, he is here alone for follow-up. Reports he has been concerned about his heart.  Over the past few months,  2 or 3 ED visits for chest pain, was concerned about possible heart attack or stroke.  Continues to have occasional left-sided chest pain. Works in a warehouse, constantly moving left arm driving forklift Some shortness of breath with activity, admits he has not been as active recently.  No dyspnea, orthopnea, PND, presyncope, syncope. No bleeding concerns. He does not report palpitations that he associates with atrial fibrillation. When cholesterol was last checked, he had run out of cholesterol medication. Is following up with PCP for recheck since he is consistently on medication. Has tested for OSA, needs CPAP per Neurology. Would like to get a home heart monitor.   Past Medical History:  Diagnosis Date   Asthma    Cancer (HCC)    Diabetes mellitus without complication (HCC)    GERD (gastroesophageal reflux disease)    Hyperlipidemia    Hypertension    Opioid abuse (HCC)    following with  methadone clinic   Pneumonia     Past Surgical History:  Procedure Laterality Date   right bicep Right 2013   TRICEPS TENDON REPAIR Left 04/2013    Current Medications: Current Meds  Medication Sig   albuterol (VENTOLIN HFA) 108 (90 Base) MCG/ACT inhaler INHALE 1 TO 2 PUFFS INTO THE LUNGS EVERY 6 HOURS AS NEEDED FOR WHEEZING OR SHORTNESS OF BREATH   blood glucose meter kit and supplies KIT Dispense based on patient and insurance preference. Use up to four times daily as directed.   Blood Pressure Monitoring (COMFORT TOUCH BP CUFF/LARGE) MISC 1 each by Does not apply route daily as needed.   ELIQUIS 5 MG TABS tablet TAKE 1 TABLET(5 MG) BY MOUTH TWICE DAILY   lisinopril (ZESTRIL) 20 MG tablet Take 1 tablet (20 mg total) by mouth daily.   Magnesium 400 MG TABS Take 400 mg by mouth daily.   metFORMIN (GLUCOPHAGE-XR) 500 MG 24 hr tablet TAKE 1 TABLET DAILY WITH BREAKFAST (LAST REFILL NEEDS AN APPOINTMENT WITH NEW PRIMARY CARE PHYSICIAN)   methadone (DOLOPHINE) 10 MG/ML solution Take 85 mg by mouth daily.    metoprolol tartrate (LOPRESSOR) 100 MG tablet Take one (1) tablet by mouth ( 100 mg ) 2 hours prior to CT scan.   multivitamin-iron-minerals-folic acid (CENTRUM) chewable tablet Chew 1 tablet by mouth daily.   Polyethylene Glycol 3350 (MIRALAX PO) Take 238 g by mouth.   rosuvastatin (CRESTOR) 10 MG tablet TAKE 1  TABLET DAILY (LAST REFILL, NEED APPOINTMENT WITH NEW PRIMARY CARE PHYSICIAN)   Semaglutide, 1 MG/DOSE, 4 MG/3ML SOPN Inject 1 mg as directed once a week.   sildenafil (VIAGRA) 100 MG tablet TAKE 1/2 TABLET(50 MG) BY MOUTH DAILY AS NEEDED FOR ERECTILE DYSFUNCTION   Spacer/Aero-Holding Chambers (E-Z SPACER) inhaler Use as instructed   Spacer/Aero-Holding Chambers DEVI 1 Device by Does not apply route daily as needed.   tamsulosin (FLOMAX) 0.4 MG CAPS capsule TAKE 1 CAPSULE DAILY (LAST REFILL, NEED APPOINTMENT WITH NEW PRIMARY CARE PHYSICIAN)   Testosterone Enanthate (XYOSTED) 100  MG/0.5ML SOAJ Inject 100 mg into the skin once a week.   valACYclovir (VALTREX) 1000 MG tablet Take 2 tablets PO BID x 1 day at onset of symptoms.     Allergies:   Patient has no known allergies.   Social History   Socioeconomic History   Marital status: Married    Spouse name: Chrystal   Number of children: 3   Years of education: Not on file   Highest education level: 9th grade  Occupational History   Not on file  Tobacco Use   Smoking status: Never   Smokeless tobacco: Never  Vaping Use   Vaping Use: Never used  Substance and Sexual Activity   Alcohol use: Yes   Drug use: Yes    Types: Marijuana   Sexual activity: Yes  Other Topics Concern   Not on file  Social History Narrative   Lives at home with wife and children   R handed   Caffeine: 7 cans of diet pepsi a day.    Social Determinants of Health   Financial Resource Strain: Not on file  Food Insecurity: Not on file  Transportation Needs: Not on file  Physical Activity: Not on file  Stress: Not on file  Social Connections: Not on file     Family History: The patient's family history includes AAA (abdominal aortic aneurysm) (age of onset: 34) in his mother; Blindness in his mother; Brain cancer (age of onset: 68) in his maternal grandmother; Diabetes in his father, mother, and paternal grandmother; Diabetes (age of onset: 51) in his paternal grandfather; Drug abuse in his brother and sister; Heart attack in his father; Stroke in his paternal grandmother; Stroke (age of onset: 53) in his father. There is no history of Colon cancer, Colon polyps, Esophageal cancer, Stomach cancer, or Rectal cancer.  ROS:   Please see the history of present illness.    + chest pain All other systems reviewed and are negative.  Labs/Other Studies Reviewed:    The following studies were reviewed today:  Cardiac Studies & Procedures       ECHOCARDIOGRAM  ECHOCARDIOGRAM COMPLETE 12/21/2021  Narrative ECHOCARDIOGRAM  REPORT    Patient Name:   Terry [name] Date of Exam: 12/21/2021 Medical Rec #:  [id number]           Height:       69.0 in Accession #:    [id number]          Weight:       244.5 lb Date of Birth:  07-21-1971          BSA:          2.250 m Patient Age:    50 years            BP:           108/63 mmHg Patient Gender: M  HR:           82 bpm. Exam Location:  [address]  Procedure: 2D Echo, 3D Echo, Cardiac Doppler and Color Doppler  Indications:    I70.0 Aortic Athlerosclerosis  History:        Patient has prior history of Echocardiogram examinations, most recent 09/02/2020. Arrythmias:Atrial Fibrillation, Signs/Symptoms:Shortness of Breath; Risk Factors:Family History of Coronary Artery Disease, Hypertension, Diabetes and Dyslipidemia. Obesity, Chronic Lymphcytic Leukemia, History of Opiod Abuse (follows with [facility]).  Sonographer:    Farrel Conners RDCS Referring Phys: Orbie Pyo  IMPRESSIONS   1. Left ventricular ejection fraction, by estimation, is 60 to 65%. The left ventricle has normal function. The left ventricle has no regional wall motion abnormalities. Left ventricular diastolic parameters are consistent with Grade I diastolic dysfunction (impaired relaxation). 2. Right ventricular systolic function is normal. The right ventricular size is normal. There is normal pulmonary artery systolic pressure. 3. Left atrial size was mildly dilated. 4. Right atrial size was moderately dilated. 5. The mitral valve is normal in structure. Trivial mitral valve regurgitation. No evidence of mitral stenosis. 6. The aortic valve is tricuspid. Aortic valve regurgitation is not visualized. No aortic stenosis is present. 7. The inferior vena cava is normal in size with greater than 50% respiratory variability, suggesting right atrial pressure of 3 mmHg.  FINDINGS Left Ventricle: Left ventricular ejection fraction, by estimation, is 60 to 65%. The  left ventricle has normal function. The left ventricle has no regional wall motion abnormalities. The left ventricular internal cavity size was normal in size. There is no left ventricular hypertrophy. Left ventricular diastolic parameters are consistent with Grade I diastolic dysfunction (impaired relaxation).  Right Ventricle: The right ventricular size is normal. No increase in right ventricular wall thickness. Right ventricular systolic function is normal. There is normal pulmonary artery systolic pressure. The tricuspid regurgitant velocity is 2.81 m/s, and with an assumed right atrial pressure of 3 mmHg, the estimated right ventricular systolic pressure is 34.6 mmHg.  Left Atrium: Left atrial size was mildly dilated.  Right Atrium: Right atrial size was moderately dilated.  Pericardium: There is no evidence of pericardial effusion.  Mitral Valve: The mitral valve is normal in structure. Trivial mitral valve regurgitation. No evidence of mitral valve stenosis.  Tricuspid Valve: The tricuspid valve is normal in structure. Tricuspid valve regurgitation is mild . No evidence of tricuspid stenosis.  Aortic Valve: The aortic valve is tricuspid. Aortic valve regurgitation is not visualized. No aortic stenosis is present.  Pulmonic Valve: The pulmonic valve was normal in structure. Pulmonic valve regurgitation is not visualized. No evidence of pulmonic stenosis.  Aorta: The aortic root is normal in size and structure.  Venous: The inferior vena cava is normal in size with greater than 50% respiratory variability, suggesting right atrial pressure of 3 mmHg.  IAS/Shunts: No atrial level shunt detected by color flow Doppler.   LEFT VENTRICLE PLAX 2D LVIDd:         4.40 cm   Diastology LVIDs:         2.20 cm   LV e' medial:    7.83 cm/s LV PW:         0.80 cm   LV E/e' medial:  7.5 LV IVS:        0.90 cm   LV e' lateral:   18.80 cm/s LVOT diam:     2.10 cm   LV E/e' lateral: 3.1 LV SV:  62 LV SV Index:   28        2D Longitudinal Strain LVOT Area:     3.46 cm  2D Strain GLS (A2C):   13.8 % 2D Strain GLS (A3C):   27.2 % 2D Strain GLS (A4C):   23.6 % 2D Strain GLS Avg:     21.6 %  3D Volume EF: 3D EF:        64 % LV EDV:       159 ml LV ESV:       57 ml LV SV:        102 ml  RIGHT VENTRICLE RV Basal diam:  4.20 cm RV Mid diam:    3.50 cm RV S prime:     13.30 cm/s TAPSE (M-mode): 2.3 cm  LEFT ATRIUM             Index        RIGHT ATRIUM           Index LA diam:        4.20 cm 1.87 cm/m   RA Area:     24.40 cm LA Vol (A2C):   58.5 ml 26.00 ml/m  RA Volume:   86.10 ml  38.26 ml/m LA Vol (A4C):   65.6 ml 29.15 ml/m LA Biplane Vol: 64.8 ml 28.80 ml/m AORTIC VALVE LVOT Vmax:   98.50 cm/s LVOT Vmean:  63.133 cm/s LVOT VTI:    0.179 m  AORTA Ao Root diam: 3.30 cm Ao Asc diam:  3.10 cm  MITRAL VALVE               TRICUSPID VALVE MV Area (PHT)  cm         TR Peak grad:   31.6 mmHg MV Decel Time: 199 msec    TR Vmax:        281.00 cm/s MV E velocity: 58.35 cm/s MV A velocity: 80.95 cm/s  SHUNTS MV E/A ratio:  0.72        Systemic VTI:  0.18 m Systemic Diam: 2.10 cm  Chilton Si MD Electronically signed by Chilton Si MD Signature Date/Time: 12/21/2021/1:08:32 PM    Final              Recent Labs: 07/05/2022: ALT 19 09/07/2022: BUN 20; Creatinine, Ser 1.26; Hemoglobin 16.5; Platelets 304; Potassium 3.8; Sodium 133  Recent Lipid Panel    Component Value Date/Time   CHOL 160 11/12/2022 0803   TRIG 70.0 11/12/2022 0803   HDL 46.30 11/12/2022 0803   CHOLHDL 3 11/12/2022 0803   VLDL 14.0 11/12/2022 0803   LDLCALC 100 (H) 11/12/2022 0803   LDLDIRECT 132.0 07/11/2019 0909     Risk Assessment/Calculations:    CHA2DS2-VASc Score = 2   This indicates a 2.2% annual risk of stroke. The patient's score is based upon: CHF History: 0 HTN History: 1 Diabetes History: 1 Stroke History: 0 Vascular Disease History: 0 Age Score:  0 Gender Score: 0          Physical Exam:    VS:  BP 114/76   Pulse 68   Ht 5\' 9"  (1.753 m)   Wt 213 lb (96.6 kg)   SpO2 94%   BMI 31.45 kg/m     Wt Readings from Last 3 Encounters:  01/04/23 213 lb (96.6 kg)  11/09/22 217 lb (98.4 kg)  09/07/22 205 lb (93 kg)     GEN: Obese,  well developed in no acute distress HEENT: Normal NECK: No JVD;  No carotid bruits CARDIAC: RRR, no murmurs, rubs, gallops RESPIRATORY:  Clear to auscultation without rales, wheezing or rhonchi  ABDOMEN: Soft, non-tender, non-distended MUSCULOSKELETAL:  No edema; No deformity. 2+ pedal pulses, equal bilaterally SKIN: Warm and dry NEUROLOGIC:  Alert and oriented x 3 PSYCHIATRIC:  Normal affect   EKG:  EKG is not ordered today.        Diagnoses:    1. Precordial pain   2. Aortic atherosclerosis (HCC)   3. PAF (paroxysmal atrial fibrillation) (HCC)   4. Hyperlipidemia associated with type 2 diabetes mellitus (HCC)   5. Hypertension associated with diabetes (HCC)   6. Chronic anticoagulation   7. OSA (obstructive sleep apnea)    Assessment and Plan:     Chest pain: He is having intermittent chest pain for the past several months, occasional shortness of breath. Two prior ED visits with normal troponin, no signs of ACS. Is very concerned about his heart.  Aortic atherosclerosis noted on prior imaging.  We will get coronary CTA for mapping of coronary anatomy.  Will give him Lopressor 100 mg to take 2 hours prior to test.  PAF on chronic anticoagulation: Review of EKGs from ED visits February 2024 and October 2023 revealed sinus rhythm. HR is well controlled. He is not aware of symptoms associated with a fib. Has occasional chest pains.  Emphasized the importance of continuing Eliquis 5 mg twice daily for stroke prevention for CHA2DS2-VASc score of 2. He is not on AV nodal blocking agent. Reports he has been diagnosed with OSA.  Emphasized the importance of treating OSA in the setting of atrial  fibrillation. Recommended he get Kardia for home monitoring.   OSA: Encouraged close follow-up with neurology for compliance with CPAP due to cardiac risk for untreated sleep apnea.   Aortic atherosclerosis/Hyperlipidemia LDL goal < 100: LDL 100 on 11/12/2022.  He was off his rosuvastatin at the time.  He has resumed rosuvastatin 10 mg daily and has follow-up testing scheduled with PCP.  Discussed the importance of good cholesterol control in the setting of overall CV risk reduction.  Hypertension: BP is well-controlled. Continue lisinopril. We will check BMP today.      Disposition: 2 to 3 months with me  Medication Adjustments/Labs and Tests Ordered: Current medicines are reviewed at length with the patient today.  Concerns regarding medicines are outlined above.  Orders Placed This Encounter  Procedures   CT CORONARY MORPH W/CTA COR W/SCORE W/CA W/CM &/OR WO/CM   Basic Metabolic Panel (BMET)   Meds ordered this encounter  Medications   metoprolol tartrate (LOPRESSOR) 100 MG tablet    Sig: Take one (1) tablet by mouth ( 100 mg ) 2 hours prior to CT scan.    Dispense:  1 tablet    Refill:  0    Patient Instructions  Medication Instructions:   Your physician recommends that you continue on your current medications as directed. Please refer to the Current Medication list given to you today.   *If you need a refill on your cardiac medications before your next appointment, please call your pharmacy*   Lab Work:  TODAY!!!! BMET  If you have labs (blood work) drawn today and your tests are completely normal, you will receive your results only by: MyChart Message (if you have MyChart) OR A paper copy in the mail If you have any lab test that is abnormal or we need to change your treatment, we will call you to review the results.   Testing/Procedures:  Your cardiac CT will be scheduled at one of the below locations:   [facility] [address] [place], [place] [address] [phone]  If scheduled at [facility], please arrive at the [facility] and Children's Entrance (Entrance C2) of [facility] 30 minutes prior to test start time. You can use the FREE valet parking offered at entrance C (encouraged to control the heart rate for the test)  Proceed to the [facility] Radiology Department (first floor) to check-in and test prep.  All radiology patients and guests should use entrance C2 at [facility], accessed from [facility], even though the hospital's physical address listed is [address]     Please follow these instructions carefully (unless otherwise directed):  Hold all erectile dysfunction medications at least 3 days (72 hrs) prior to test. (Ie viagra, cialis, sildenafil, tadalafil, etc) We will administer nitroglycerin during this exam.   On the Night Before the Test: Be sure to Drink plenty of water. Do not consume any caffeinated/decaffeinated beverages or chocolate 12 hours prior to your test. Do not take any antihistamines 12 hours prior to your test.  On the Day of the Test: Drink plenty of water until 1 hour prior to the test. Do not eat any food 1 hour prior to test. You may take your regular medications prior to the test.  Take metoprolol (Lopressor) one tablet by mouth ( 100 mg) two hours prior to test.     After the Test: Drink plenty of water. After receiving IV contrast, you may experience a mild flushed feeling. This is normal. On occasion, you may experience a mild rash up to 24 hours after the test. This is not dangerous. If this occurs, you can take Benadryl 25 mg and increase your fluid intake. If you experience trouble breathing, this can be serious. If it is severe call 911 IMMEDIATELY. If it is mild, please call our office. If you take any of these medications: Metformin, please do not take 48 hours after completing test unless otherwise  instructed.  We will call to schedule your test 2-4 weeks out understanding that some insurance companies will need an authorization prior to the service being performed.   For non-scheduling related questions, please contact the cardiac imaging nurse navigator should you have any questions/concerns: Rockwell Alexandria, Cardiac Imaging Nurse Navigator Larey Brick, Cardiac Imaging Nurse Navigator [facility] Direct Office Dial: [phone]   For scheduling needs, including cancellations and rescheduling, please call [place], [phone].    Follow-Up: At [facility], you and your health needs are our priority.  As part of our continuing mission to provide you with exceptional heart care, we have created designated Provider Care Teams.  These Care Teams include your primary Cardiologist (physician) and Advanced Practice Providers (APPs -  Physician Assistants and Nurse Practitioners) who all work together to provide you with the care you need, when you need it.  We recommend signing up for the patient portal called "MyChart".  Sign up information is provided on this After Visit Summary.  MyChart is used to connect with patients for Virtual Visits (Telemedicine).  Patients are able to view lab/test results, encounter notes, upcoming appointments, etc.  Non-urgent messages can be sent to your provider as well.   To learn more about what you can do with MyChart, go to [email or web address].    Your next appointment:   2 month(s)  Provider:   Eligha Bridegroom,  NP         Other Instructions  Please look into a Kardia Monitor.   Mediterranean Diet A Mediterranean diet refers to food and lifestyle choices that are based on the traditions of countries located on the [employer]. It focuses on eating more fruits, vegetables, whole grains, beans, nuts, seeds, and heart-healthy fats, and eating less dairy, meat, eggs, and processed foods with added sugar,  salt, and fat. This way of eating has been shown to help prevent certain conditions and improve outcomes for people who have chronic diseases, like kidney disease and heart disease. What are tips for following this plan? Reading food labels Check the serving size of packaged foods. For foods such as rice and pasta, the serving size refers to the amount of cooked product, not dry. Check the total fat in packaged foods. Avoid foods that have saturated fat or trans fats. Check the ingredient list for added sugars, such as corn syrup. Shopping  Buy a variety of foods that offer a balanced diet, including: Fresh fruits and vegetables (produce). Grains, beans, nuts, and seeds. Some of these may be available in unpackaged forms or large amounts (in bulk). Fresh seafood. Poultry and eggs. Low-fat dairy products. Buy whole ingredients instead of prepackaged foods. Buy fresh fruits and vegetables in-season from local farmers markets. Buy plain frozen fruits and vegetables. If you do not have access to quality fresh seafood, buy precooked frozen shrimp or canned fish, such as tuna, salmon, or sardines. Stock your pantry so you always have certain foods on hand, such as olive oil, canned tuna, canned tomatoes, rice, pasta, and beans. Cooking Cook foods with extra-virgin olive oil instead of using butter or other vegetable oils. Have meat as a side dish, and have vegetables or grains as your main dish. This means having meat in small portions or adding small amounts of meat to foods like pasta or stew. Use beans or vegetables instead of meat in common dishes like chili or lasagna. Experiment with different cooking methods. Try roasting, broiling, steaming, and sauting vegetables. Add frozen vegetables to soups, stews, pasta, or rice. Add nuts or seeds for added healthy fats and plant protein at each meal. You can add these to yogurt, salads, or vegetable dishes. Marinate fish or vegetables using olive  oil, lemon juice, garlic, and fresh herbs. Meal planning Plan to eat one vegetarian meal one day each week. Try to work up to two vegetarian meals, if possible. Eat seafood two or more times a week. Have healthy snacks readily available, such as: Vegetable sticks with hummus. Greek yogurt. Fruit and nut trail mix. Eat balanced meals throughout the week. This includes: Fruit: 2-3 servings a day. Vegetables: 4-5 servings a day. Low-fat dairy: 2 servings a day. Fish, poultry, or lean meat: 1 serving a day. Beans and legumes: 2 or more servings a week. Nuts and seeds: 1-2 servings a day. Whole grains: 6-8 servings a day. Extra-virgin olive oil: 3-4 servings a day. Limit red meat and sweets to only a few servings a month. Lifestyle  Cook and eat meals together with your family, when possible. Drink enough fluid to keep your urine pale yellow. Be physically active every day. This includes: Aerobic exercise like running or swimming. Leisure activities like gardening, walking, or housework. Get 7-8 hours of sleep each night. If recommended by your health care provider, drink red wine in moderation. This means 1 glass a day for nonpregnant women and 2 glasses a day for men. A  glass of wine equals 5 oz (150 mL). What foods should I eat? Fruits Apples. Apricots. Avocado. Berries. Bananas. Cherries. Dates. Figs. Grapes. Lemons. Melon. Oranges. Peaches. Plums. Pomegranate. Vegetables Artichokes. Beets. Broccoli. Cabbage. Carrots. Eggplant. Green beans. Chard. Kale. Spinach. Onions. Leeks. Peas. Squash. Tomatoes. Peppers. Radishes. Grains Whole-grain pasta. Brown rice. Bulgur wheat. Polenta. Couscous. Whole-wheat bread. Orpah Cobb. Meats and other proteins Beans. Almonds. Sunflower seeds. Pine nuts. Peanuts. Cod. Salmon. Scallops. Shrimp. Tuna. Tilapia. Clams. Oysters. Eggs. Poultry without skin. Dairy Low-fat milk. Cheese. Greek yogurt. Fats and oils Extra-virgin olive oil. Avocado  oil. Grapeseed oil. Beverages Water. Red wine. Herbal tea. Sweets and desserts Greek yogurt with honey. Baked apples. Poached pears. Trail mix. Seasonings and condiments Basil. Cilantro. Coriander. Cumin. Mint. Parsley. Sage. Rosemary. Tarragon. Garlic. Oregano. Thyme. Pepper. Balsamic vinegar. Tahini. Hummus. Tomato sauce. Olives. Mushrooms. The items listed above may not be a complete list of foods and beverages you can eat. Contact a dietitian for more information. What foods should I limit? This is a list of foods that should be eaten rarely or only on special occasions. Fruits Fruit canned in syrup. Vegetables Deep-fried potatoes (french fries). Grains Prepackaged pasta or rice dishes. Prepackaged cereal with added sugar. Prepackaged snacks with added sugar. Meats and other proteins Beef. Pork. Lamb. Poultry with skin. Hot dogs. Tomasa Blase. Dairy Ice cream. Sour cream. Whole milk. Fats and oils Butter. Canola oil. Vegetable oil. Beef fat (tallow). Lard. Beverages Juice. Sugar-sweetened soft drinks. Beer. Liquor and spirits. Sweets and desserts Cookies. Cakes. Pies. Candy. Seasonings and condiments Mayonnaise. Pre-made sauces and marinades. The items listed above may not be a complete list of foods and beverages you should limit. Contact a dietitian for more information. Summary The Mediterranean diet includes both food and lifestyle choices. Eat a variety of fresh fruits and vegetables, beans, nuts, seeds, and whole grains. Limit the amount of red meat and sweets that you eat. If recommended by your health care provider, drink red wine in moderation. This means 1 glass a day for nonpregnant women and 2 glasses a day for men. A glass of wine equals 5 oz (150 mL). This information is not intended to replace advice given to you by your health care provider. Make sure you discuss any questions you have with your health care provider. Document Revised: 08/24/2019 Document Reviewed:  06/21/2019 Elsevier Patient Education  2023 Elsevier Inc. Adopting a Healthy Lifestyle.   Weight: Know what a healthy weight is for you (roughly BMI <25) and aim to maintain this. You can calculate your body mass index on your smart phone  Diet: Aim for 7+ servings of fruits and vegetables daily Limit animal fats in diet for cholesterol and heart health - choose grass fed whenever available Avoid highly processed foods (fast food burgers, tacos, fried chicken, pizza, hot dogs, french fries)  Saturated fat comes in the form of butter, lard, coconut oil, margarine, partially hydrogenated oils, and fat in meat. These increase your risk of cardiovascular disease.  Use healthy plant oils, such as olive, canola, soy, corn, sunflower and peanut.  Whole foods such as fruits, vegetables and whole grains have fiber  Men need > 38 grams of fiber per day Women need > 25 grams of fiber per day  Load up on vegetables and fruits - one-half of your plate: Aim for color and variety, and remember that potatoes dont count. Go for whole grains - one-quarter of your plate: Whole wheat, barley, wheat berries, quinoa, oats, brown rice, and foods made  with them. If you want pasta, go with whole wheat pasta. Protein power - one-quarter of your plate: Fish, chicken, beans, and nuts are all healthy, versatile protein sources. Limit red meat. You need carbohydrates for energy! The type of carbohydrate is more important than the amount. Choose carbohydrates such as vegetables, fruits, whole grains, beans, and nuts in the place of white rice, white pasta, potatoes (baked or fried), macaroni and cheese, cakes, cookies, and donuts.  If youre thirsty, drink water. Coffee and tea are good in moderation, but skip sugary drinks and limit milk and dairy products to one or two daily servings. Keep sugar intake at 6 teaspoons or 24 grams or LESS       Exercise: Aim for 150 min of moderate intensity exercise weekly for heart  health, and weights twice weekly for bone health Stay active - any steps are better than no steps! Aim for 7-9 hours of sleep daily          Signed, Levi Aland, NP  01/04/2023 6:34 PM    [facility]

## 2023-01-04 NOTE — Patient Instructions (Signed)
Medication Instructions:   Your physician recommends that you continue on your current medications as directed. Please refer to the Current Medication list given to you today.   *If you need a refill on your cardiac medications before your next appointment, please call your pharmacy*   Lab Work:  TODAY!!!! BMET  If you have labs (blood work) drawn today and your tests are completely normal, you will receive your results only by: MyChart Message (if you have MyChart) OR A paper copy in the mail If you have any lab test that is abnormal or we need to change your treatment, we will call you to review the results.   Testing/Procedures:    Your cardiac CT will be scheduled at one of the below locations:   [facility] [address] [place], [place] [address] [phone]  If scheduled at [facility], please arrive at the [facility] and Children's Entrance (Entrance C2) of [facility] 30 minutes prior to test start time. You can use the FREE valet parking offered at entrance C (encouraged to control the heart rate for the test)  Proceed to the [facility] Radiology Department (first floor) to check-in and test prep.  All radiology patients and guests should use entrance C2 at [facility], accessed from [facility], even though the hospital's physical address listed is [address].     Please follow these instructions carefully (unless otherwise directed):  Hold all erectile dysfunction medications at least 3 days (72 hrs) prior to test. (Ie viagra, cialis, sildenafil, tadalafil, etc) We will administer nitroglycerin during this exam.   On the Night Before the Test: Be sure to Drink plenty of water. Do not consume any caffeinated/decaffeinated beverages or chocolate 12 hours prior to your test. Do not take any antihistamines 12 hours prior to your test.  On the Day of the Test: Drink plenty of water until 1 hour prior  to the test. Do not eat any food 1 hour prior to test. You may take your regular medications prior to the test.  Take metoprolol (Lopressor) one tablet by mouth ( 100 mg) two hours prior to test.     After the Test: Drink plenty of water. After receiving IV contrast, you may experience a mild flushed feeling. This is normal. On occasion, you may experience a mild rash up to 24 hours after the test. This is not dangerous. If this occurs, you can take Benadryl 25 mg and increase your fluid intake. If you experience trouble breathing, this can be serious. If it is severe call 911 IMMEDIATELY. If it is mild, please call our office. If you take any of these medications: Metformin, please do not take 48 hours after completing test unless otherwise instructed.  We will call to schedule your test 2-4 weeks out understanding that some insurance companies will need an authorization prior to the service being performed.   For non-scheduling related questions, please contact the cardiac imaging nurse navigator should you have any questions/concerns: Rockwell [place], Cardiac Imaging Nurse Navigator Larey Brick, Cardiac Imaging Nurse Navigator [facility] Direct Office Dial: [phone]   For scheduling needs, including cancellations and rescheduling, please call [place], [phone].    Follow-Up: At [facility], you and your health needs are our priority.  As part of our continuing mission to provide you with exceptional heart care, we have created designated Provider Care Teams.  These Care Teams include your primary Cardiologist (physician)  and Advanced Practice Providers (APPs -  Physician Assistants and Nurse Practitioners) who all work together to provide you with the care you need, when you need it.  We recommend signing up for the patient portal called "MyChart".  Sign up information is provided on this After Visit Summary.  MyChart is used to connect  with patients for Virtual Visits (Telemedicine).  Patients are able to view lab/test results, encounter notes, upcoming appointments, etc.  Non-urgent messages can be sent to your provider as well.   To learn more about what you can do with MyChart, go to [email or web address].    Your next appointment:   2 month(s)  Provider:   Eligha Bridegroom, NP         Other Instructions  Please look into a Kardia Monitor.   Mediterranean Diet A Mediterranean diet refers to food and lifestyle choices that are based on the traditions of countries located on the [employer]. It focuses on eating more fruits, vegetables, whole grains, beans, nuts, seeds, and heart-healthy fats, and eating less dairy, meat, eggs, and processed foods with added sugar, salt, and fat. This way of eating has been shown to help prevent certain conditions and improve outcomes for people who have chronic diseases, like kidney disease and heart disease. What are tips for following this plan? Reading food labels Check the serving size of packaged foods. For foods such as rice and pasta, the serving size refers to the amount of cooked product, not dry. Check the total fat in packaged foods. Avoid foods that have saturated fat or trans fats. Check the ingredient list for added sugars, such as corn syrup. Shopping  Buy a variety of foods that offer a balanced diet, including: Fresh fruits and vegetables (produce). Grains, beans, nuts, and seeds. Some of these may be available in unpackaged forms or large amounts (in bulk). Fresh seafood. Poultry and eggs. Low-fat dairy products. Buy whole ingredients instead of prepackaged foods. Buy fresh fruits and vegetables in-season from local farmers markets. Buy plain frozen fruits and vegetables. If you do not have access to quality fresh seafood, buy precooked frozen shrimp or canned fish, such as tuna, salmon, or sardines. Stock your pantry so you always have certain foods on  hand, such as olive oil, canned tuna, canned tomatoes, rice, pasta, and beans. Cooking Cook foods with extra-virgin olive oil instead of using butter or other vegetable oils. Have meat as a side dish, and have vegetables or grains as your main dish. This means having meat in small portions or adding small amounts of meat to foods like pasta or stew. Use beans or vegetables instead of meat in common dishes like chili or lasagna. Experiment with different cooking methods. Try roasting, broiling, steaming, and sauting vegetables. Add frozen vegetables to soups, stews, pasta, or rice. Add nuts or seeds for added healthy fats and plant protein at each meal. You can add these to yogurt, salads, or vegetable dishes. Marinate fish or vegetables using olive oil, lemon juice, garlic, and fresh herbs. Meal planning Plan to eat one vegetarian meal one day each week. Try to work up to two vegetarian meals, if possible. Eat seafood two or more times a week. Have healthy snacks readily available, such as: Vegetable sticks with hummus. Greek yogurt. Fruit and nut trail mix. Eat balanced meals throughout the week. This includes: Fruit: 2-3 servings a day. Vegetables: 4-5 servings a day. Low-fat dairy: 2 servings a day. Fish, poultry, or lean meat: 1 serving a day. Beans  and legumes: 2 or more servings a week. Nuts and seeds: 1-2 servings a day. Whole grains: 6-8 servings a day. Extra-virgin olive oil: 3-4 servings a day. Limit red meat and sweets to only a few servings a month. Lifestyle  Cook and eat meals together with your family, when possible. Drink enough fluid to keep your urine pale yellow. Be physically active every day. This includes: Aerobic exercise like running or swimming. Leisure activities like gardening, walking, or housework. Get 7-8 hours of sleep each night. If recommended by your health care provider, drink red wine in moderation. This means 1 glass a day for nonpregnant women  and 2 glasses a day for men. A glass of wine equals 5 oz (150 mL). What foods should I eat? Fruits Apples. Apricots. Avocado. Berries. Bananas. Cherries. Dates. Figs. Grapes. Lemons. Melon. Oranges. Peaches. Plums. Pomegranate. Vegetables Artichokes. Beets. Broccoli. Cabbage. Carrots. Eggplant. Green beans. Chard. Kale. Spinach. Onions. Leeks. Peas. Squash. Tomatoes. Peppers. Radishes. Grains Whole-grain pasta. Brown rice. Bulgur wheat. Polenta. Couscous. Whole-wheat bread. Orpah Cobb. Meats and other proteins Beans. Almonds. Sunflower seeds. Pine nuts. Peanuts. Cod. Salmon. Scallops. Shrimp. Tuna. Tilapia. Clams. Oysters. Eggs. Poultry without skin. Dairy Low-fat milk. Cheese. Greek yogurt. Fats and oils Extra-virgin olive oil. Avocado oil. Grapeseed oil. Beverages Water. Red wine. Herbal tea. Sweets and desserts Greek yogurt with honey. Baked apples. Poached pears. Trail mix. Seasonings and condiments Basil. Cilantro. Coriander. Cumin. Mint. Parsley. Sage. Rosemary. Tarragon. Garlic. Oregano. Thyme. Pepper. Balsamic vinegar. Tahini. Hummus. Tomato sauce. Olives. Mushrooms. The items listed above may not be a complete list of foods and beverages you can eat. Contact a dietitian for more information. What foods should I limit? This is a list of foods that should be eaten rarely or only on special occasions. Fruits Fruit canned in syrup. Vegetables Deep-fried potatoes (french fries). Grains Prepackaged pasta or rice dishes. Prepackaged cereal with added sugar. Prepackaged snacks with added sugar. Meats and other proteins Beef. Pork. Lamb. Poultry with skin. Hot dogs. Tomasa Blase. Dairy Ice cream. Sour cream. Whole milk. Fats and oils Butter. Canola oil. Vegetable oil. Beef fat (tallow). Lard. Beverages Juice. Sugar-sweetened soft drinks. Beer. Liquor and spirits. Sweets and desserts Cookies. Cakes. Pies. Candy. Seasonings and condiments Mayonnaise. Pre-made sauces and  marinades. The items listed above may not be a complete list of foods and beverages you should limit. Contact a dietitian for more information. Summary The Mediterranean diet includes both food and lifestyle choices. Eat a variety of fresh fruits and vegetables, beans, nuts, seeds, and whole grains. Limit the amount of red meat and sweets that you eat. If recommended by your health care provider, drink red wine in moderation. This means 1 glass a day for nonpregnant women and 2 glasses a day for men. A glass of wine equals 5 oz (150 mL). This information is not intended to replace advice given to you by your health care provider. Make sure you discuss any questions you have with your health care provider. Document Revised: 08/24/2019 Document Reviewed: 06/21/2019 Elsevier Patient Education  2023 Elsevier Inc. Adopting a Healthy Lifestyle.   Weight: Know what a healthy weight is for you (roughly BMI <25) and aim to maintain this. You can calculate your body mass index on your smart phone  Diet: Aim for 7+ servings of fruits and vegetables daily Limit animal fats in diet for cholesterol and heart health - choose grass fed whenever available Avoid highly processed foods (fast food burgers, tacos, fried chicken, pizza, hot dogs, french  fries)  Saturated fat comes in the form of butter, lard, coconut oil, margarine, partially hydrogenated oils, and fat in meat. These increase your risk of cardiovascular disease.  Use healthy plant oils, such as olive, canola, soy, corn, sunflower and peanut.  Whole foods such as fruits, vegetables and whole grains have fiber  Men need > 38 grams of fiber per day Women need > 25 grams of fiber per day  Load up on vegetables and fruits - one-half of your plate: Aim for color and variety, and remember that potatoes dont count. Go for whole grains - one-quarter of your plate: Whole wheat, barley, wheat berries, quinoa, oats, brown rice, and foods made with them. If  you want pasta, go with whole wheat pasta. Protein power - one-quarter of your plate: Fish, chicken, beans, and nuts are all healthy, versatile protein sources. Limit red meat. You need carbohydrates for energy! The type of carbohydrate is more important than the amount. Choose carbohydrates such as vegetables, fruits, whole grains, beans, and nuts in the place of white rice, white pasta, potatoes (baked or fried), macaroni and cheese, cakes, cookies, and donuts.  If youre thirsty, drink water. Coffee and tea are good in moderation, but skip sugary drinks and limit milk and dairy products to one or two daily servings. Keep sugar intake at 6 teaspoons or 24 grams or LESS       Exercise: Aim for 150 min of moderate intensity exercise weekly for heart health, and weights twice weekly for bone health Stay active - any steps are better than no steps! Aim for 7-9 hours of sleep daily

## 2023-01-05 LAB — BASIC METABOLIC PANEL
BUN/Creatinine Ratio: 13 (ref 9–20)
BUN: 16 mg/dL (ref 6–24)
CO2: 27 mmol/L (ref 20–29)
Calcium: 10 mg/dL (ref 8.7–10.2)
Chloride: 102 mmol/L (ref 96–106)
Creatinine, Ser: 1.23 mg/dL (ref 0.76–1.27)
Glucose: 87 mg/dL (ref 70–99)
Potassium: 4.8 mmol/L (ref 3.5–5.2)
Sodium: 140 mmol/L (ref 134–144)
eGFR: 72 mL/min/{1.73_m2} (ref >59)

## 2023-01-10 ENCOUNTER — Other Ambulatory Visit: Payer: Self-pay | Admitting: *Deleted

## 2023-01-10 DIAGNOSIS — N529 Male erectile dysfunction, unspecified: Secondary | ICD-10-CM

## 2023-01-10 MED ORDER — SILDENAFIL CITRATE 100 MG PO TABS
ORAL_TABLET | ORAL | 0 refills | Status: DC
Start: 2023-01-10 — End: 2024-03-09

## 2023-01-10 NOTE — Telephone Encounter (Signed)
Ok to refill 

## 2023-01-10 NOTE — Telephone Encounter (Signed)
Rx done. 

## 2023-01-12 ENCOUNTER — Telehealth: Payer: Self-pay

## 2023-01-12 ENCOUNTER — Other Ambulatory Visit (HOSPITAL_COMMUNITY): Payer: Self-pay

## 2023-01-12 NOTE — Telephone Encounter (Signed)
Pharmacy Patient Advocate Encounter   Received notification from CoverMyMeds that prior authorization for Terry [name] is required/requested.   Insurance verification completed.   The patient is insured through [facility] [employer]  .   Per test claim:  PA submitted to [facility] via [place] options: CoverMyMeds Key/confirmation #/EOC [id number] Status is pending

## 2023-01-15 NOTE — Telephone Encounter (Signed)
Patient Advocate Encounter  Prior Authorization for [employer] /0.5ML auto-injectors has been approved through [place] [employer].    Key: [id number]  Effective: 01-13-2023 to 01-12-2024

## 2023-01-18 NOTE — Telephone Encounter (Signed)
Contacted pharmacy and spoke to Maralyn Sago. They are aware of the approval. She states they will work on it.   Attempted to reach pt to let him know. Left a detail message and to give [place] a call if have any questions.

## 2023-01-19 LAB — COLOGUARD: COLOGUARD: NEGATIVE

## 2023-02-08 ENCOUNTER — Encounter (HOSPITAL_COMMUNITY): Payer: Self-pay

## 2023-02-08 ENCOUNTER — Ambulatory Visit (INDEPENDENT_AMBULATORY_CARE_PROVIDER_SITE_OTHER): Payer: BC Managed Care – PPO | Admitting: Family Medicine

## 2023-02-08 ENCOUNTER — Encounter: Payer: Self-pay | Admitting: Family Medicine

## 2023-02-08 VITALS — BP 100/70 | HR 76 | Temp 97.8°F | Ht 69.0 in | Wt 204.0 lb

## 2023-02-08 DIAGNOSIS — E1165 Type 2 diabetes mellitus with hyperglycemia: Secondary | ICD-10-CM

## 2023-02-08 DIAGNOSIS — Z7984 Long term (current) use of oral hypoglycemic drugs: Secondary | ICD-10-CM

## 2023-02-08 DIAGNOSIS — R0789 Other chest pain: Secondary | ICD-10-CM | POA: Diagnosis not present

## 2023-02-08 DIAGNOSIS — R7989 Other specified abnormal findings of blood chemistry: Secondary | ICD-10-CM | POA: Diagnosis not present

## 2023-02-08 DIAGNOSIS — I10 Essential (primary) hypertension: Secondary | ICD-10-CM | POA: Diagnosis not present

## 2023-02-08 LAB — POCT GLYCOSYLATED HEMOGLOBIN (HGB A1C): Hemoglobin A1C: 5.9 % — AB (ref 4.0–5.6)

## 2023-02-08 LAB — TESTOSTERONE: Testosterone: 252.1 ng/dL — ABNORMAL LOW (ref 300.00–890.00)

## 2023-02-08 MED ORDER — LISINOPRIL 20 MG PO TABS
10.0000 mg | ORAL_TABLET | Freq: Every day | ORAL | 1 refills | Status: DC
Start: 2023-02-08 — End: 2023-10-07

## 2023-02-08 MED ORDER — XYOSTED 100 MG/0.5ML ~~LOC~~ SOAJ
100.0000 mg | SUBCUTANEOUS | 2 refills | Status: DC
Start: 2023-02-08 — End: 2023-05-11

## 2023-02-08 NOTE — Assessment & Plan Note (Signed)
Needs trough testosterone level today, continue 100 mg weekly injections of testosterone for now. Refilled this for him today

## 2023-02-08 NOTE — Progress Notes (Signed)
Established Patient Office Visit  Subjective   Patient ID: Terry [name], male    DOB: Mar 23, 1971  Age: 52 y.o. MRN: [id number]  Chief Complaint  Patient presents with   Medical Management of Chronic Issues   Fatigue    Intermittently x7 weeks   Chest Pain    Patient complains of chest pain noted last night while working, increased sweating (also works in a warehouse) and chest pain noted along the left shoulder/chest area, no known injury, history of Afib    Pt is reporting is has been working overtime recently and last night he was working in [employer], was doing a lot of sweating, started having chest pain on the upper left side, states he is breathing fine, no SOB, but he was reporting increased sweating as well, no dizziness or other associated symptoms. States that he has a stress test already scheduled for this Thursday. EKG in office performed today and is WNL, normal sinus rhythm and there are no ST -T wave abnormalities.   HTN-- BP is actually on the low side today, he has lost about 13 pounds since April's visit. He reports that he has occasionally stood up and gotten a bit dizzy. We discussed decreasing his dose of lisinopril to 10 mg daily and he is agreeable.  DM-- A1C performed in office today and is 5.9, pt reports he is doing well with the semaglutide 1 mg weekly and the metformin 500 mg daily. Foot exam performed today and he was reminded that he is due for a diabetic eye exam.   Hypo T-- pt reports he is doing relatively well on the 100 mg injections weekly. States that he is due today for a dose. Will get a trough testosterone today.     Current Outpatient Medications  Medication Instructions   albuterol (VENTOLIN HFA) 108 (90 Base) MCG/ACT inhaler INHALE 1 TO 2 PUFFS INTO THE LUNGS EVERY 6 HOURS AS NEEDED FOR WHEEZING OR SHORTNESS OF BREATH   blood glucose meter kit and supplies KIT Dispense based on patient and insurance preference. Use up to four times  daily as directed.   Blood Pressure Monitoring (COMFORT TOUCH BP CUFF/LARGE) MISC 1 each, Does not apply, Daily PRN   ELIQUIS 5 MG TABS tablet TAKE 1 TABLET(5 MG) BY MOUTH TWICE DAILY   lisinopril (ZESTRIL) 10 mg, Oral, Daily   Magnesium 400 mg, Oral, Daily   metFORMIN (GLUCOPHAGE-XR) 500 MG 24 hr tablet TAKE 1 TABLET DAILY WITH BREAKFAST (LAST REFILL NEEDS AN APPOINTMENT WITH NEW PRIMARY CARE PHYSICIAN)   methadone (DOLOPHINE) 85 mg, Oral, Daily   metoprolol tartrate (LOPRESSOR) 100 MG tablet Take one (1) tablet by mouth ( 100 mg ) 2 hours prior to CT scan.   multivitamin-iron-minerals-folic acid (CENTRUM) chewable tablet 1 tablet, Oral, Daily   Polyethylene Glycol 3350 (MIRALAX PO) 238 g, Oral   rosuvastatin (CRESTOR) 10 MG tablet TAKE 1 TABLET DAILY (LAST REFILL, NEED APPOINTMENT WITH NEW PRIMARY CARE PHYSICIAN)   Semaglutide (1 MG/DOSE) 1 mg, Injection, Weekly   sildenafil (VIAGRA) 100 MG tablet TAKE 1/2 TABLET(50 MG) BY MOUTH DAILY AS NEEDED FOR ERECTILE DYSFUNCTION   Spacer/Aero-Holding Chambers (E-Z SPACER) inhaler Use as instructed   Spacer/Aero-Holding Chambers DEVI 1 Device, Does not apply, Daily PRN   tamsulosin (FLOMAX) 0.4 MG CAPS capsule TAKE 1 CAPSULE DAILY (LAST REFILL, NEED APPOINTMENT WITH NEW PRIMARY CARE PHYSICIAN)   valACYclovir (VALTREX) 1000 MG tablet Take 2 tablets PO BID x 1 day at onset of symptoms.  Xyosted 100 mg, Subcutaneous, Weekly    Patient Active Problem List   Diagnosis Date Noted   Low testosterone in male 11/10/2022   Recurrent circadian rhythm sleep disorder, shift work type 12/15/2021   Paroxysmal atrial fibrillation (HCC) 12/15/2021   Class 2 drug-induced obesity with serious comorbidity and body mass index (BMI) of 36.0 to 36.9 in adult 12/15/2021   Type 2 diabetes mellitus without complication, without long-term current use of insulin (HCC) 12/15/2021   At risk for central sleep apnea 12/15/2021   Non-restorative sleep 12/15/2021   Diabetes  mellitus type II, controlled (HCC) 12/02/2021   CLL (chronic lymphocytic leukemia) (HCC) 12/06/2019   Opioid dependence (HCC) 07/12/2019   Restless leg syndrome 07/03/2019   Chronic insomnia 07/03/2019   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     Objective:     BP 100/70 (BP Location: Left Arm, Patient Position: Sitting, Cuff Size: Large)   Pulse 76   Temp 97.8 F (36.6 C) (Oral)   Ht 5\' 9"  (1.753 m)   Wt 204 lb (92.5 kg)   SpO2 98%   BMI 30.13 kg/m    Physical Exam Vitals reviewed.  Constitutional:      Appearance: Normal appearance. He is well-groomed and normal weight.  Eyes:     Extraocular Movements: Extraocular movements intact.     Conjunctiva/sclera: Conjunctivae normal.  Neck:     Thyroid: No thyromegaly.  Cardiovascular:     Rate and Rhythm: Normal rate and regular rhythm.     Heart sounds: S1 normal and S2 normal. No murmur heard. Pulmonary:     Effort: Pulmonary effort is normal.     Breath sounds: Normal breath sounds and air entry. No rales.  Abdominal:     General: Abdomen is flat. Bowel sounds are normal.  Musculoskeletal:     Right lower leg: No edema.     Left lower leg: No edema.  Neurological:     General: No focal deficit present.     Mental Status: He is alert and oriented to person, place, and time.     Gait: Gait is intact.  Psychiatric:        Mood and Affect: Mood and affect normal.    Diabetic Foot Exam - Simple   Simple Foot Form Diabetic Foot exam was performed with the following findings: Yes 02/08/2023  9:12 AM  Visual Inspection No deformities, no ulcerations, no other skin breakdown bilaterally: Yes Sensation  Testing Intact to touch and monofilament testing bilaterally: Yes Pulse Check Posterior Tibialis and Dorsalis pulse intact bilaterally: Yes Comments      Results for orders placed or performed in visit on 02/08/23  POC HgB A1c  Result Value Ref Range   Hemoglobin A1C 5.9 (A) 4.0 - 5.6 %   HbA1c POC (<> result, manual entry)     HbA1c, POC (prediabetic range)     HbA1c, POC (controlled diabetic range)     EKG interpretation note:  HR is 69, EKG demonstrates normal sinus rhythm, normal morphology of P and QRS complex, no ST/T wave abnormalities. It was compared to previous EKG from February and does not show any changes at this time.  The 10-year ASCVD risk score (Arnett DK, et al., 2019) is: 3.5%    Assessment & Plan:  Other chest pain -     EKG was performed and read by me in the office today given his complaints. Shows NSR, no St or T wave abnormalities, HR is 69. He is scheduled for CT of the coronaries on Thursday. I gave him a work excuse to be off work until the CT scan is performed.   EKG 12-Lead  Primary hypertension Assessment & Plan: BP actually on the low side today, occasional dizziness with standing, will reduce dose of lisinopril to 10 mg daily, RTC 3 months  Orders: -     Lisinopril; Take 0.5 tablets (10 mg total) by mouth daily.  Dispense: 45 tablet; Refill: 1  Controlled type 2 diabetes mellitus with hyperglycemia, without long-term current use of insulin (HCC) Assessment & Plan: Well controlled, foot exam normal, needs eye exam pt was advised to get the eye exam today. Will continue the semaglutide 1 mg weekly and the metformin ER 500 mg daily. RTC 3 months.   Orders: -     POCT glycosylated hemoglobin (Hb A1C)  Low testosterone in male Assessment & Plan: Needs trough testosterone level today, continue 100 mg weekly injections of testosterone for now. Refilled this for him today  Orders: Barbaraann Cao; Inject 0.5 mLs (100 mg total) into the skin  once a week.  Dispense: 2 mL; Refill: 2 -     Testosterone     Return in about 3 months (around 05/11/2023) for HTN, DM, med refills.    Karie Georges, MD

## 2023-02-08 NOTE — Assessment & Plan Note (Signed)
Well controlled, foot exam normal, needs eye exam pt was advised to get the eye exam today. Will continue the semaglutide 1 mg weekly and the metformin ER 500 mg daily. RTC 3 months.

## 2023-02-08 NOTE — Patient Instructions (Signed)
HOLD the lisinopril on the day of your CT scan-- take the metoprolol 2 hours before your CT scan.

## 2023-02-08 NOTE — Assessment & Plan Note (Signed)
BP actually on the low side today, occasional dizziness with standing, will reduce dose of lisinopril to 10 mg daily, RTC 3 months

## 2023-02-09 ENCOUNTER — Telehealth (HOSPITAL_COMMUNITY): Payer: Self-pay | Admitting: Emergency Medicine

## 2023-02-09 NOTE — Telephone Encounter (Signed)
Reaching out to patient to offer assistance regarding upcoming cardiac imaging study; pt verbalizes understanding of appt date/time, parking situation and where to check in, pre-test NPO status and medications ordered, and verified current allergies; name and call back number provided for further questions should they arise Deondray Ospina RN Navigator Cardiac Imaging  [facility] [phone] office [phone] cell 

## 2023-02-10 ENCOUNTER — Ambulatory Visit (HOSPITAL_COMMUNITY)
Admission: RE | Admit: 2023-02-10 | Discharge: 2023-02-10 | Disposition: A | Discharge status: Home / Self Care | Payer: BC Managed Care – PPO | Source: Ambulatory Visit | Attending: Nurse Practitioner | Admitting: Nurse Practitioner

## 2023-02-10 DIAGNOSIS — R072 Precordial pain: Secondary | ICD-10-CM | POA: Diagnosis present

## 2023-02-10 MED ORDER — IOHEXOL 350 MG/ML SOLN
95.0000 mL | Freq: Once | INTRAVENOUS | Status: AC | PRN
  Administered 2023-02-10: 95 mL via INTRAVENOUS

## 2023-02-10 MED ORDER — NITROGLYCERIN 0.4 MG SL SUBL
0.8000 mg | SUBLINGUAL_TABLET | SUBLINGUAL | Status: DC | PRN
  Administered 2023-02-10: 0.8 mg via SUBLINGUAL

## 2023-02-10 MED ORDER — NITROGLYCERIN 0.4 MG SL SUBL
SUBLINGUAL_TABLET | SUBLINGUAL | Status: AC
  Filled 2023-02-10: qty 2

## 2023-02-11 ENCOUNTER — Other Ambulatory Visit: Payer: Self-pay | Admitting: *Deleted

## 2023-02-11 DIAGNOSIS — E785 Hyperlipidemia, unspecified: Secondary | ICD-10-CM

## 2023-02-11 DIAGNOSIS — I480 Paroxysmal atrial fibrillation: Secondary | ICD-10-CM

## 2023-02-11 DIAGNOSIS — Z79899 Other long term (current) drug therapy: Secondary | ICD-10-CM

## 2023-02-11 DIAGNOSIS — I700 Atherosclerosis of aorta: Secondary | ICD-10-CM

## 2023-02-16 ENCOUNTER — Ambulatory Visit: Payer: BC Managed Care – PPO | Attending: Family Medicine

## 2023-03-28 NOTE — Progress Notes (Unsigned)
Cardiology Office Note:    Date:  03/31/2023   ID:  Terry [name], DOB August 20, 1970, MRN [id number]  PCP:  Karie Georges, MD   [facility] Providers Cardiologist:  Orbie Pyo, MD     Referring MD: Karie Georges, MD   Chief Complaint: chest pain, a fib  History of Present Illness:    Terry [name] is a very pleasant 52 y.o. male with a hx of atrial fibrillation on chronic anticoagulation, aortic atherosclerosis, type 2 diabetes, HTN, HLD, obesity, and CLL.   Referred to cardiology for management of A-fib and seen by Dr. Lynnette Caffey 12/03/21.  He works full-time as a Estate agent. He was overall doing well and plan was to get echo and continue current therapies with plan to return in 1 year for follow-up.   2D echo 12/21/2021 revealed normal LVEF 60 to 65%, G1 DD, mildly dilated LA, moderately dilated RA but no significant valve disease.  Seen by me on 01/04/23. Reported concerned about his heart. Over the past few months,  2 or 3 ED visits for chest pain, was concerned about possible heart attack or stroke. Continues to have occasional left-sided chest pain. Works in a warehouse, constantly moving left arm driving forklift  Some shortness of breath with activity, admits he has not been as active recently.  No dyspnea, orthopnea, PND, presyncope, syncope. No bleeding concerns. He does not report palpitations that he associates with atrial fibrillation. When cholesterol was last checked, he had run out of cholesterol medication. Is following up with PCP for recheck since he is consistently on medication. Has tested for OSA, needs CPAP per Neurology. Would like to get a home heart monitor. 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  He had been started on rosuvastatin 10 mg daily by PCP.  He was advised  to return for recheck of cholesterol.  Today, he is here alone for follow-up. Just got off work, alternates day and night shift 12 hour shifts. Reports occasional chest pains that are short lasting and are not associated with any additional symptoms of shortness of breath, diaphoresis, or n/v.  Reports he has never been aware of episodes of A-fib and wonders if he is out of rhythm. Discussed ways he could monitor his rhythm and HR. Admits he is not taking his Eliquis twice daily due to his schedule. At times, has noted low SBP in the 80s. He is not particularly symptomatic with these episodes but they concern him. He has held his lisinopril on occasion and BP goes up to 140s systolic. He denies dyspnea, lower extremity edema, fatigue, palpitations, melena, presyncope, syncope, orthopnea, and PND.   Past Medical History:  Diagnosis Date   Asthma    Cancer (HCC)    Diabetes mellitus without complication (HCC)    GERD (gastroesophageal reflux disease)    Hyperlipidemia    Hypertension    Opioid abuse (HCC)    following with methadone clinic   Pneumonia     Past Surgical History:  Procedure Laterality Date   right bicep Right 2013   TRICEPS TENDON REPAIR Left 04/2013    Current Medications: Current Meds  Medication Sig   albuterol (VENTOLIN HFA) 108 (90 Base) MCG/ACT inhaler INHALE 1 TO 2 PUFFS INTO THE LUNGS EVERY 6 HOURS AS NEEDED FOR WHEEZING OR SHORTNESS OF  BREATH   blood glucose meter kit and supplies KIT Dispense based on patient and insurance preference. Use up to four times daily as directed.   Blood Pressure Monitoring (COMFORT TOUCH BP CUFF/LARGE) MISC 1 each by Does not apply route daily as needed.   lisinopril (ZESTRIL) 20 MG tablet Take 0.5 tablets (10 mg total) by mouth daily.   Magnesium 400 MG TABS Take 400 mg by mouth daily.   metFORMIN (GLUCOPHAGE-XR) 500 MG 24 hr tablet TAKE 1 TABLET DAILY WITH BREAKFAST (LAST REFILL NEEDS AN APPOINTMENT WITH NEW PRIMARY CARE PHYSICIAN)    methadone (DOLOPHINE) 10 MG/ML solution Take 85 mg by mouth daily.    multivitamin-iron-minerals-folic acid (CENTRUM) chewable tablet Chew 1 tablet by mouth daily.   Polyethylene Glycol 3350 (MIRALAX PO) Take 238 g by mouth.   rivaroxaban (XARELTO) 20 MG TABS tablet Take 1 tablet (20 mg total) by mouth daily with supper.   rosuvastatin (CRESTOR) 10 MG tablet TAKE 1 TABLET DAILY (LAST REFILL, NEED APPOINTMENT WITH NEW PRIMARY CARE PHYSICIAN)   Semaglutide, 1 MG/DOSE, 4 MG/3ML SOPN Inject 1 mg as directed once a week.   sildenafil (VIAGRA) 100 MG tablet TAKE 1/2 TABLET(50 MG) BY MOUTH DAILY AS NEEDED FOR ERECTILE DYSFUNCTION   Spacer/Aero-Holding Chambers (E-Z SPACER) inhaler Use as instructed   Spacer/Aero-Holding Chambers DEVI 1 Device by Does not apply route daily as needed.   tamsulosin (FLOMAX) 0.4 MG CAPS capsule TAKE 1 CAPSULE DAILY (LAST REFILL, NEED APPOINTMENT WITH NEW PRIMARY CARE PHYSICIAN)   Testosterone Enanthate (XYOSTED) 100 MG/0.5ML SOAJ Inject 0.5 mLs (100 mg total) into the skin once a week.   valACYclovir (VALTREX) 1000 MG tablet Take 2 tablets PO BID x 1 day at onset of symptoms.   [DISCONTINUED] ELIQUIS 5 MG TABS tablet TAKE 1 TABLET(5 MG) BY MOUTH TWICE DAILY   [DISCONTINUED] metoprolol tartrate (LOPRESSOR) 100 MG tablet Take one (1) tablet by mouth ( 100 mg ) 2 hours prior to CT scan.     Allergies:   Patient has no known allergies.   Social History   Socioeconomic History   Marital status: Married    Spouse name: Chrystal   Number of children: 3   Years of education: Not on file   Highest education level: 9th grade  Occupational History   Not on file  Tobacco Use   Smoking status: Never   Smokeless tobacco: Never  Vaping Use   Vaping status: Never Used  Substance and Sexual Activity   Alcohol use: Yes   Drug use: Yes    Types: Marijuana   Sexual activity: Yes  Other Topics Concern   Not on file  Social History Narrative   Lives at home with wife and  children   R handed   Caffeine: 7 cans of diet pepsi a day.    Social Determinants of Health   Financial Resource Strain: Not on file  Food Insecurity: Not on file  Transportation Needs: Not on file  Physical Activity: Not on file  Stress: Not on file  Social Connections: Not on file     Family History: The patient's family history includes AAA (abdominal aortic aneurysm) (age of onset: 62) in his mother; Blindness in his mother; Brain cancer (age of onset: 81) in his maternal grandmother; Diabetes in his father, mother, and paternal grandmother; Diabetes (age of onset: 54) in his paternal grandfather; Drug abuse in his brother and sister; Heart attack in his father; Stroke in his paternal grandmother; Stroke (age of onset:  21) in his father. There is no history of Colon cancer, Colon polyps, Esophageal cancer, Stomach cancer, or Rectal cancer.  ROS:   Please see the history of present illness.    + occasional chest pain All other systems reviewed and are negative.  Labs/Other Studies Reviewed:    The following studies were reviewed today:  Cardiac Studies & Procedures       ECHOCARDIOGRAM  ECHOCARDIOGRAM COMPLETE 12/21/2021  Narrative ECHOCARDIOGRAM REPORT    Patient Name:   Spero Wehe Date of Exam: 12/21/2021 Medical Rec #:  [id number]           Height:       69.0 in Accession #:    [id number]          Weight:       244.5 lb Date of Birth:  [birth year]          BSA:          2.250 m Patient Age:    50 years            BP:           108/63 mmHg Patient Gender: M                   HR:           82 bpm. Exam Location:  [address]  Procedure: 2D Echo, 3D Echo, Cardiac Doppler and Color Doppler  Indications:    I70.0 Aortic Athlerosclerosis  History:        Patient has prior history of Echocardiogram examinations, most recent 09/02/2020. Arrythmias:Atrial Fibrillation, Signs/Symptoms:Shortness of Breath; Risk Factors:Family History of Coronary Artery Disease,  Hypertension, Diabetes and Dyslipidemia. Obesity, Chronic Lymphcytic Leukemia, History of Opiod Abuse (follows with [facility]).  Sonographer:    Farrel Conners RDCS Referring Phys: Orbie Pyo  IMPRESSIONS   1. Left ventricular ejection fraction, by estimation, is 60 to 65%. The left ventricle has normal function. The left ventricle has no regional wall motion abnormalities. Left ventricular diastolic parameters are consistent with Grade I diastolic dysfunction (impaired relaxation). 2. Right ventricular systolic function is normal. The right ventricular size is normal. There is normal pulmonary artery systolic pressure. 3. Left atrial size was mildly dilated. 4. Right atrial size was moderately dilated. 5. The mitral valve is normal in structure. Trivial mitral valve regurgitation. No evidence of mitral stenosis. 6. The aortic valve is tricuspid. Aortic valve regurgitation is not visualized. No aortic stenosis is present. 7. The inferior vena cava is normal in size with greater than 50% respiratory variability, suggesting right atrial pressure of 3 mmHg.  FINDINGS Left Ventricle: Left ventricular ejection fraction, by estimation, is 60 to 65%. The left ventricle has normal function. The left ventricle has no regional wall motion abnormalities. The left ventricular internal cavity size was normal in size. There is no left ventricular hypertrophy. Left ventricular diastolic parameters are consistent with Grade I diastolic dysfunction (impaired relaxation).  Right Ventricle: The right ventricular size is normal. No increase in right ventricular wall thickness. Right ventricular systolic function is normal. There is normal pulmonary artery systolic pressure. The tricuspid regurgitant velocity is 2.81 m/s, and with an assumed right atrial pressure of 3 mmHg, the estimated right ventricular systolic pressure is 34.6 mmHg.  Left Atrium: Left atrial size was mildly dilated.  Right  Atrium: Right atrial size was moderately dilated.  Pericardium: There is no evidence of pericardial effusion.  Mitral Valve: The mitral valve is normal in structure.  Trivial mitral valve regurgitation. No evidence of mitral valve stenosis.  Tricuspid Valve: The tricuspid valve is normal in structure. Tricuspid valve regurgitation is mild . No evidence of tricuspid stenosis.  Aortic Valve: The aortic valve is tricuspid. Aortic valve regurgitation is not visualized. No aortic stenosis is present.  Pulmonic Valve: The pulmonic valve was normal in structure. Pulmonic valve regurgitation is not visualized. No evidence of pulmonic stenosis.  Aorta: The aortic root is normal in size and structure.  Venous: The inferior vena cava is normal in size with greater than 50% respiratory variability, suggesting right atrial pressure of 3 mmHg.  IAS/Shunts: No atrial level shunt detected by color flow Doppler.   LEFT VENTRICLE PLAX 2D LVIDd:         4.40 cm   Diastology LVIDs:         2.20 cm   LV e' medial:    7.83 cm/s LV PW:         0.80 cm   LV E/e' medial:  7.5 LV IVS:        0.90 cm   LV e' lateral:   18.80 cm/s LVOT diam:     2.10 cm   LV E/e' lateral: 3.1 LV SV:         62 LV SV Index:   28        2D Longitudinal Strain LVOT Area:     3.46 cm  2D Strain GLS (A2C):   13.8 % 2D Strain GLS (A3C):   27.2 % 2D Strain GLS (A4C):   23.6 % 2D Strain GLS Avg:     21.6 %  3D Volume EF: 3D EF:        64 % LV EDV:       159 ml LV ESV:       57 ml LV SV:        102 ml  RIGHT VENTRICLE RV Basal diam:  4.20 cm RV Mid diam:    3.50 cm RV S prime:     13.30 cm/s TAPSE (M-mode): 2.3 cm  LEFT ATRIUM             Index        RIGHT ATRIUM           Index LA diam:        4.20 cm 1.87 cm/m   RA Area:     24.40 cm LA Vol (A2C):   58.5 ml 26.00 ml/m  RA Volume:   86.10 ml  38.26 ml/m LA Vol (A4C):   65.6 ml 29.15 ml/m LA Biplane Vol: 64.8 ml 28.80 ml/m AORTIC VALVE LVOT Vmax:   98.50  cm/s LVOT Vmean:  63.133 cm/s LVOT VTI:    0.179 m  AORTA Ao Root diam: 3.30 cm Ao Asc diam:  3.10 cm  MITRAL VALVE               TRICUSPID VALVE MV Area (PHT)  cm         TR Peak grad:   31.6 mmHg MV Decel Time: 199 msec    TR Vmax:        281.00 cm/s MV E velocity: 58.35 cm/s MV A velocity: 80.95 cm/s  SHUNTS MV E/A ratio:  0.72        Systemic VTI:  0.18 m Systemic Diam: 2.10 cm  Chilton Si MD Electronically signed by Chilton Si MD Signature Date/Time: 12/21/2021/1:08:32 PM    Final     CT SCANS  CT CORONARY MORPH W/CTA COR W/SCORE 02/10/2023  Addendum 02/15/2023  5:32 PM ADDENDUM REPORT: 02/15/2023 17:30  EXAM: OVER-READ INTERPRETATION  CT CHEST  The following report is an over-read performed by radiologist Dr. Joen Laura [facility] on 02/15/2023. This over-read does not include interpretation of cardiac or coronary anatomy or pathology. The coronary CTA interpretation by the cardiologist is attached.  COMPARISON:  01/13/2020  FINDINGS: Mediastinum: No pathologic adenopathy. Small hiatal hernia partially visualized.  Lungs: No acute pleural or parenchymal lung disease.  Upper abdomen: Unremarkable.  Musculoskeletal: No acute or destructive bony abnormalities. Reconstructed images demonstrate no additional findings.  IMPRESSION: 1. Partially visualized small hiatal hernia. 2. Otherwise no acute or significant noncardiac findings.   Electronically Signed By: Sharlet Salina M.D. On: 02/15/2023 17:30  Narrative CLINICAL DATA:  This is a 52 year old male with anginal symptoms.  EXAM: Cardiac/Coronary  CTA  TECHNIQUE: The patient was scanned on a [employer].  FINDINGS: A 100 kV prospective scan was triggered in the descending thoracic aorta at 111 HU's. Axial non-contrast 3 mm slices were carried out through the heart. The data set was analyzed on a dedicated work station and scored using the Agatson  method. Gantry rotation speed was 250 msecs and collimation was .6 mm. No beta blockade and 0.8 mg of sl NTG was given. The 3D data set was reconstructed in 5% intervals of the 67-82 % of the R-R cycle. Diastolic phases were analyzed on a dedicated work station using MPR, MIP and VRT modes. The patient received 80 cc of contrast.  Aorta: Normal size.  No calcifications.  No dissection.  Aortic Valve:  Trileaflet.  No calcifications.  Coronary Arteries:  Normal coronary origin.  Co- dominance.  RCA is a large dominant artery that gives rise to PDA and PLA. Diffuse minimal (<24%) calcified plaques in throughout the RCA.  Left main is a large artery that gives rise to LAD and LCX arteries.  LAD is a large vessel that has no plaque. Mild (25-49%) focal calcified plaque at the ostium of the LAD. Mild plaques in the proximal LAD. The mid and distal LAD with minimal calcifications.  LCX is a co-dominant artery that gives rise to one large OM1 branch. There are minimal calcification in the proximal and mid LCX. Distal LCX with no plaques.  Coronary Calcium Score:  Left main: 0  Left anterior descending artery: 146  Left circumflex artery: 16  Right coronary artery: 157  Total: 320  Percentile: 99  Other findings:  Normal pulmonary vein drainage into the left atrium.  Normal left atrial appendage without a thrombus.  Normal size of the pulmonary artery.  IMPRESSION: 1. Coronary calcium score of 320. This was 38 percentile for age and sex matched control.  2. Normal coronary origin with right dominance.  3. CAD-RADS 2. Mild non-obstructive CAD (25-49%). Consider non-atherosclerotic causes of chest pain. Consider preventive therapy and risk factor modification.  The noncardiac portion of this study will be interpreted in separate report by the radiologist.  Electronically Signed: By: Thomasene Ripple D.O. On: 02/10/2023 10:36           Recent Labs: 07/05/2022: ALT  19 09/07/2022: Hemoglobin 16.5; Platelets 304 01/04/2023: BUN 16; Creatinine, Ser 1.23; Potassium 4.8; Sodium 140  Recent Lipid Panel    Component Value Date/Time   CHOL 160 11/12/2022 0803   TRIG 70.0 11/12/2022 0803   HDL 46.30 11/12/2022 0803   CHOLHDL 3 11/12/2022 0803   VLDL  14.0 11/12/2022 0803   LDLCALC 100 (H) 11/12/2022 0803   LDLDIRECT 132.0 07/11/2019 0909     Risk Assessment/Calculations:    CHA2DS2-VASc Score = 2   This indicates a 2.2% annual risk of stroke. The patient's score is based upon: CHF History: 0 HTN History: 1 Diabetes History: 1 Stroke History: 0 Vascular Disease History: 0 Age Score: 0 Gender Score: 0          Physical Exam:    VS:  BP 110/78   Pulse (!) 58   Ht 5\' 9"  (1.753 m)   Wt 215 lb (97.5 kg)   SpO2 97%   BMI 31.75 kg/m     Wt Readings from Last 3 Encounters:  03/31/23 215 lb (97.5 kg)  02/08/23 204 lb (92.5 kg)  01/04/23 213 lb (96.6 kg)     GEN: Obese,  well developed in no acute distress HEENT: Normal NECK: No JVD; No carotid bruits CARDIAC: RRR, no murmurs, rubs, gallops RESPIRATORY:  Clear to auscultation without rales, wheezing or rhonchi  ABDOMEN: Soft, non-tender, non-distended MUSCULOSKELETAL:  No edema; No deformity. 2+ pedal pulses, equal bilaterally SKIN: Warm and dry NEUROLOGIC:  Alert and oriented x 3 PSYCHIATRIC:  Normal affect   EKG:  EKG is not ordered today.       Diagnoses:    1. Coronary artery disease involving native coronary artery of native heart without angina pectoris   2. Hyperlipidemia LDL goal <70   3. PAF (paroxysmal atrial fibrillation) (HCC)   4. Medication management   5. Chronic anticoagulation   6. OSA (obstructive sleep apnea)   7. Hypertension associated with diabetes (HCC)   8. Aortic atherosclerosis (HCC)     Assessment and Plan:     CAD without angina:  Mild nonobstructive CAD (25-49%) on coronary CTA 02/10/23. He has occasional fleeting chest pains not associated with  any additional symptoms that are atypical for angina. We discussed symptoms that would be concerning for acute MI.  Secondary prevention emphasized including heart healthy diet, weight loss, and 150 minutes of moderate intensity exercise each week. We will recheck cholesterol today to ensure LDL at goal of < 70.  PAF on chronic anticoagulation: HR is well controlled. He is not aware of symptoms associated with a fib. Has occasional chest pains that may be palpitations. Clinically appears to be maintaining sinus rhythm today. He is struggling with taking Eliquis twice daily. We will transition to Xarelto 20 mg daily, first dose to be taken 12 hours after last dose of Eliquis then every 24 hours for stroke prevention for CHA2DS2-VASc score of 2. He is not on AV nodal blocking agent. Encouraged him to consider getting a home Kardia monitor or smart watch to track a fib. If he is having frequent episodes of a fib, would refer back to EP for consideration of ablation or AAD.   OSA: Encouraged close follow-up with neurology for compliance with CPAP due to cardiac risk for untreated sleep apnea.   Aortic atherosclerosis/Hyperlipidemia LDL goal < 70: LDL 100 on 1/61/0960.  He was off his rosuvastatin at the time.  He has resumed rosuvastatin 10 mg daily. We will recheck fasting lipid panel and CMP today. Emphasized the importance of good cholesterol control in overall CV risk reduction.  Hypertension: He reports labile BP at home.  I have asked him to track this consistently and send to [place] for further advisement.     Disposition: 6 months with me  Medication Adjustments/Labs and Tests Ordered: Current  medicines are reviewed at length with the patient today.  Concerns regarding medicines are outlined above.  Orders Placed This Encounter  Procedures   Comp Met (CMET)   Lipid panel   Meds ordered this encounter  Medications   rivaroxaban (XARELTO) 20 MG TABS tablet    Sig: Take 1 tablet (20 mg total) by  mouth daily with supper.    Dispense:  90 tablet    Refill:  3    Order Specific Question:   Supervising Provider    Answer:   Vesta Mixer [phone]    Patient Instructions  Medication Instructions:  Your physician has recommended you make the following change in your medication:  STOP Eilquis  12 HOURS AFTER LAST DOSE OF ELIQUIS, TAKE YOUR 1ST DOSE OF  XARELTO 20 MG THEN TAKE 1 EVERY 24 HOURS AFTER  *If you need a refill on your cardiac medications before your next appointment, please call your pharmacy*   Lab Work: TODAY:  CMET & LIPID  If you have labs (blood work) drawn today and your tests are completely normal, you will receive your results only by: MyChart Message (if you have MyChart) OR A paper copy in the mail If you have any lab test that is abnormal or we need to change your treatment, we will call you to review the results.   Testing/Procedures: None ordered   Follow-Up: At [facility], you and your health needs are our priority.  As part of our continuing mission to provide you with exceptional heart care, we have created designated Provider Care Teams.  These Care Teams include your primary Cardiologist (physician) and Advanced Practice Providers (APPs -  Physician Assistants and Nurse Practitioners) who all work together to provide you with the care you need, when you need it.  We recommend signing up for the patient portal called "MyChart".  Sign up information is provided on this After Visit Summary.  MyChart is used to connect with patients for Virtual Visits (Telemedicine).  Patients are able to view lab/test results, encounter notes, upcoming appointments, etc.  Non-urgent messages can be sent to your provider as well.   To learn more about what you can do with MyChart, go to [email or web address].    Your next appointment:   6 month(s)  WILL CALL YOU WITH AN APPOINTMENT  Provider:   Eligha Bridegroom, NP         Other Instructions      Signed, Levi Aland, NP  03/31/2023 9:13 AM    [facility]

## 2023-03-31 ENCOUNTER — Ambulatory Visit: Payer: BC Managed Care – PPO | Admitting: Nurse Practitioner

## 2023-03-31 ENCOUNTER — Encounter: Payer: Self-pay | Admitting: Nurse Practitioner

## 2023-03-31 VITALS — BP 110/78 | HR 58 | Ht 69.0 in | Wt 215.0 lb

## 2023-03-31 DIAGNOSIS — Z79899 Other long term (current) drug therapy: Secondary | ICD-10-CM

## 2023-03-31 DIAGNOSIS — I2510 Atherosclerotic heart disease of native coronary artery without angina pectoris: Secondary | ICD-10-CM

## 2023-03-31 DIAGNOSIS — Z7984 Long term (current) use of oral hypoglycemic drugs: Secondary | ICD-10-CM

## 2023-03-31 DIAGNOSIS — I152 Hypertension secondary to endocrine disorders: Secondary | ICD-10-CM

## 2023-03-31 DIAGNOSIS — Z7901 Long term (current) use of anticoagulants: Secondary | ICD-10-CM

## 2023-03-31 DIAGNOSIS — E1159 Type 2 diabetes mellitus with other circulatory complications: Secondary | ICD-10-CM

## 2023-03-31 DIAGNOSIS — I480 Paroxysmal atrial fibrillation: Secondary | ICD-10-CM | POA: Diagnosis not present

## 2023-03-31 DIAGNOSIS — E785 Hyperlipidemia, unspecified: Secondary | ICD-10-CM

## 2023-03-31 DIAGNOSIS — G4733 Obstructive sleep apnea (adult) (pediatric): Secondary | ICD-10-CM

## 2023-03-31 DIAGNOSIS — I700 Atherosclerosis of aorta: Secondary | ICD-10-CM

## 2023-03-31 MED ORDER — RIVAROXABAN 20 MG PO TABS: 20.0000 mg | ORAL_TABLET | Freq: Every day | ORAL | 3 refills | Status: DC

## 2023-03-31 NOTE — Patient Instructions (Addendum)
Medication Instructions:  Your physician has recommended you make the following change in your medication:  STOP Eilquis  12 HOURS AFTER LAST DOSE OF ELIQUIS, TAKE YOUR 1ST DOSE OF  XARELTO 20 MG THEN TAKE 1 EVERY 24 HOURS AFTER  *If you need a refill on your cardiac medications before your next appointment, please call your pharmacy*   Lab Work: TODAY:  CMET & LIPID  If you have labs (blood work) drawn today and your tests are completely normal, you will receive your results only by: MyChart Message (if you have MyChart) OR A paper copy in the mail If you have any lab test that is abnormal or we need to change your treatment, we will call you to review the results.   Testing/Procedures: None ordered   Follow-Up: At [facility], you and your health needs are our priority.  As part of our continuing mission to provide you with exceptional heart care, we have created designated Provider Care Teams.  These Care Teams include your primary Cardiologist (physician) and Advanced Practice Providers (APPs -  Physician Assistants and Nurse Practitioners) who all work together to provide you with the care you need, when you need it.  We recommend signing up for the patient portal called "MyChart".  Sign up information is provided on this After Visit Summary.  MyChart is used to connect with patients for Virtual Visits (Telemedicine).  Patients are able to view lab/test results, encounter notes, upcoming appointments, etc.  Non-urgent messages can be sent to your provider as well.   To learn more about what you can do with MyChart, go to [email or web address].    Your next appointment:   6 month(s)  WILL CALL YOU WITH AN APPOINTMENT  Provider:   Eligha Bridegroom, NP         Other Instructions

## 2023-04-01 LAB — COMPREHENSIVE METABOLIC PANEL WITH GFR
ALT: 19 IU/L (ref 0–44)
AST: 16 IU/L (ref 0–40)
Albumin: 4.5 g/dL (ref 4.1–5.1)
Alkaline Phosphatase: 72 IU/L (ref 44–121)
BUN/Creatinine Ratio: 15 (ref 9–20)
BUN: 18 mg/dL (ref 6–24)
Bilirubin Total: 0.9 mg/dL (ref 0.0–1.2)
CO2: 27 mmol/L (ref 20–29)
Calcium: 9.5 mg/dL (ref 8.7–10.2)
Chloride: 102 mmol/L (ref 96–106)
Creatinine, Ser: 1.23 mg/dL (ref 0.76–1.27)
Globulin, Total: 2.4 g/dL (ref 1.5–4.5)
Glucose: 104 mg/dL — ABNORMAL HIGH (ref 70–99)
Potassium: 4.6 mmol/L (ref 3.5–5.2)
Sodium: 139 mmol/L (ref 134–144)
Total Protein: 6.9 g/dL (ref 6.0–8.5)
eGFR: 72 mL/min/1.73

## 2023-04-01 LAB — LIPID PANEL
Chol/HDL Ratio: 3.3 ratio (ref 0.0–5.0)
Cholesterol, Total: 122 mg/dL (ref 100–199)
HDL: 37 mg/dL — ABNORMAL LOW (ref >39)
LDL Chol Calc (NIH): 69 mg/dL (ref 0–99)
Triglycerides: 79 mg/dL (ref 0–149)
VLDL Cholesterol Cal: 16 mg/dL (ref 5–40)

## 2023-04-15 ENCOUNTER — Other Ambulatory Visit: Payer: Self-pay | Admitting: Family Medicine

## 2023-04-15 DIAGNOSIS — E7800 Pure hypercholesterolemia, unspecified: Secondary | ICD-10-CM

## 2023-05-05 ENCOUNTER — Other Ambulatory Visit: Payer: Self-pay | Admitting: Internal Medicine

## 2023-05-05 DIAGNOSIS — I480 Paroxysmal atrial fibrillation: Secondary | ICD-10-CM

## 2023-05-05 NOTE — Telephone Encounter (Signed)
Eliquis 5mg  refill request received. Patient is 52 years old, weight-97.5kg, Crea-1.23 on 03/31/23, Diagnosis-Afib, and last seen by Eligha Bridegroom on 03/31/23. Dose is appropriate based on dosing criteria.  However, per last OV note it states:  He is struggling with taking Eliquis twice daily. We will transition to Xarelto 20 mg daily, first dose to be taken 12 hours after last dose of Eliquis then every 24 hours for stroke prevention for CHA2DS2-VASc score of 2

## 2023-05-06 ENCOUNTER — Telehealth: Payer: Self-pay | Admitting: Family Medicine

## 2023-05-06 MED ORDER — TAMSULOSIN HCL 0.4 MG PO CAPS: 0.4000 mg | ORAL_CAPSULE | Freq: Every day | ORAL | 0 refills | Status: DC

## 2023-05-06 NOTE — Telephone Encounter (Signed)
Rx done. 

## 2023-05-06 NOTE — Addendum Note (Signed)
Addended by: Johnella Moloney on: 05/06/2023 04:50 PM   Modules accepted: Orders

## 2023-05-06 NOTE — Telephone Encounter (Signed)
Prescription Request  05/06/2023  LOV: 02/08/2023  What is the name of the medication or equipment? tamsulosin (FLOMAX) 0.4 MG CAPS capsule   Have you contacted your pharmacy to request a refill? No   Which pharmacy would you like this sent to?  [id number] - [place],  - [address] AT [facility] OF [address] [place] [address] Phone: [phone] Fax: [phone]     Patient notified that their request is being sent to the clinical staff for review and that they should receive a response within 2 business days.   Please advise at Mobile [phone] (mobile)

## 2023-05-07 ENCOUNTER — Emergency Department (HOSPITAL_COMMUNITY): Payer: BC Managed Care – PPO

## 2023-05-07 ENCOUNTER — Emergency Department (HOSPITAL_COMMUNITY)
Admission: EM | Admit: 2023-05-07 | Discharge: 2023-05-07 | Disposition: A | Discharge status: Home / Self Care | Payer: BC Managed Care – PPO | Attending: Emergency Medicine | Admitting: Emergency Medicine

## 2023-05-07 DIAGNOSIS — R0789 Other chest pain: Secondary | ICD-10-CM | POA: Diagnosis present

## 2023-05-07 DIAGNOSIS — R0602 Shortness of breath: Secondary | ICD-10-CM | POA: Diagnosis not present

## 2023-05-07 DIAGNOSIS — I10 Essential (primary) hypertension: Secondary | ICD-10-CM | POA: Diagnosis not present

## 2023-05-07 DIAGNOSIS — Z79899 Other long term (current) drug therapy: Secondary | ICD-10-CM | POA: Diagnosis not present

## 2023-05-07 DIAGNOSIS — R002 Palpitations: Secondary | ICD-10-CM | POA: Insufficient documentation

## 2023-05-07 DIAGNOSIS — R200 Anesthesia of skin: Secondary | ICD-10-CM | POA: Diagnosis not present

## 2023-05-07 LAB — CBC
HCT: 45.5 % (ref 39.0–52.0)
Hemoglobin: 14.9 g/dL (ref 13.0–17.0)
MCH: 28.5 pg (ref 26.0–34.0)
MCHC: 32.7 g/dL (ref 30.0–36.0)
MCV: 87.2 fL (ref 80.0–100.0)
Platelets: 297 10*3/uL (ref 150–400)
RBC: 5.22 MIL/uL (ref 4.22–5.81)
RDW: 11.7 % (ref 11.5–15.5)
WBC: 13.3 10*3/uL — ABNORMAL HIGH (ref 4.0–10.5)
nRBC: 0 % (ref 0.0–0.2)

## 2023-05-07 LAB — BASIC METABOLIC PANEL
Anion gap: 10 (ref 5–15)
BUN: 18 mg/dL (ref 6–20)
CO2: 27 mmol/L (ref 22–32)
Calcium: 8.8 mg/dL — ABNORMAL LOW (ref 8.9–10.3)
Chloride: 98 mmol/L (ref 98–111)
Creatinine, Ser: 1.18 mg/dL (ref 0.61–1.24)
GFR, Estimated: >60 mL/min (ref >60)
Glucose, Bld: 97 mg/dL (ref 70–99)
Potassium: 4.1 mmol/L (ref 3.5–5.1)
Sodium: 135 mmol/L (ref 135–145)

## 2023-05-07 LAB — TROPONIN I (HIGH SENSITIVITY)
Troponin I (High Sensitivity): 3 ng/L (ref <18)
Troponin I (High Sensitivity): 4 ng/L (ref <18)

## 2023-05-07 NOTE — Discharge Instructions (Signed)
Follow-up with your primary care doctor and cardiologist.  Return if symptoms worsen.

## 2023-05-07 NOTE — ED Triage Notes (Signed)
Patient here from work reporting ongoing sob with new chest pain that started while at work this morning. Denies n/v.

## 2023-05-07 NOTE — ED Provider Notes (Signed)
[place] EMERGENCY DEPARTMENT AT [facility] Provider Note   CSN: [id number] Arrival date & time: 05/07/23  4098     History  Chief Complaint  Patient presents with   Chest Pain   Shortness of Breath    Terry [name] is a 52 y.o. male.  Patient here with chest pain.  Was having some numbness and palpitations, high blood pressure.  Nothing makes it worse or better.  History of hypertension high cholesterol, CLL.  He is not having any symptoms now.  He is feeling better.  Discomfort occurred about 6 hours ago.  He is on blood thinner for A-fib.  He has been compliant.  Denies any fever chills cough or sputum production.  Denies any exertional chest pain.  Has not had any episodes of discomfort prior to today.  The history is provided by the patient.       Home Medications Prior to Admission medications   Medication Sig Start Date End Date Taking? Authorizing Provider  albuterol (VENTOLIN HFA) 108 (90 Base) MCG/ACT inhaler INHALE 1 TO 2 PUFFS INTO THE LUNGS EVERY 6 HOURS AS NEEDED FOR WHEEZING OR SHORTNESS OF BREATH 12/08/21   Koberlein, Paris Lore, MD  blood glucose meter kit and supplies KIT Dispense based on patient and insurance preference. Use up to four times daily as directed. 05/13/21   Wynn Banker, MD  Blood Pressure Monitoring (COMFORT TOUCH BP CUFF/LARGE) MISC 1 each by Does not apply route daily as needed. 05/13/21   Koberlein, Paris Lore, MD  lisinopril (ZESTRIL) 20 MG tablet Take 0.5 tablets (10 mg total) by mouth daily. 02/08/23   Karie Georges, MD  Magnesium 400 MG TABS Take 400 mg by mouth daily. 10/22/20   Wynn Banker, MD  metFORMIN (GLUCOPHAGE-XR) 500 MG 24 hr tablet TAKE 1 TABLET DAILY WITH BREAKFAST (LAST REFILL NEEDS AN APPOINTMENT WITH NEW PRIMARY CARE PHYSICIAN) 11/24/22   Karie Georges, MD  methadone (DOLOPHINE) 10 MG/ML solution Take 85 mg by mouth daily.     [provider]  multivitamin-iron-minerals-folic acid  (CENTRUM) chewable tablet Chew 1 tablet by mouth daily.    [provider]  Polyethylene Glycol 3350 (MIRALAX PO) Take 238 g by mouth.    [provider]  rivaroxaban (XARELTO) 20 MG TABS tablet Take 1 tablet (20 mg total) by mouth daily with supper. 03/31/23   Swinyer, Zachary George, NP  rosuvastatin (CRESTOR) 10 MG tablet TAKE 1 TABLET BY MOUTH DAILY 04/18/23   Karie Georges, MD  Semaglutide, 1 MG/DOSE, 4 MG/3ML SOPN Inject 1 mg as directed once a week. 11/09/22   Karie Georges, MD  sildenafil (VIAGRA) 100 MG tablet TAKE 1/2 TABLET(50 MG) BY MOUTH DAILY AS NEEDED FOR ERECTILE DYSFUNCTION 01/10/23   Karie Georges, MD  Spacer/Aero-Holding Chambers (E-Z SPACER) inhaler Use as instructed 10/13/18   Wynn Banker, MD  Spacer/Aero-Holding Deretha Emory DEVI 1 Device by Does not apply route daily as needed. 09/15/20   Wynn Banker, MD  tamsulosin (FLOMAX) 0.4 MG CAPS capsule Take 1 capsule (0.4 mg total) by mouth daily. 05/06/23   Karie Georges, MD  Testosterone Enanthate (XYOSTED) 100 MG/0.5ML SOAJ Inject 0.5 mLs (100 mg total) into the skin once a week. 02/08/23   Karie Georges, MD  valACYclovir (VALTREX) 1000 MG tablet Take 2 tablets PO BID x 1 day at onset of symptoms. 05/24/22   Karie Georges, MD      Allergies  Patient has no known allergies.    Review of Systems   Review of Systems  Physical Exam Updated Vital Signs BP (!) 165/80 (BP Location: Left Arm)   Pulse 73   Temp 98.9 F (37.2 C) (Oral)   Resp 18   Ht 5\' 9"  (1.753 m)   Wt 95.3 kg   SpO2 98%   BMI 31.01 kg/m  Physical Exam Vitals and nursing note reviewed.  Constitutional:      General: He is not in acute distress.    Appearance: He is well-developed. He is not ill-appearing.  HENT:     Head: Normocephalic and atraumatic.  Eyes:     Extraocular Movements: Extraocular movements intact.     Conjunctiva/sclera: Conjunctivae normal.     Pupils: Pupils are equal, round, and  reactive to light.  Cardiovascular:     Rate and Rhythm: Normal rate and regular rhythm.     Pulses:          Radial pulses are 2+ on the right side and 2+ on the left side.     Heart sounds: Normal heart sounds. No murmur heard. Pulmonary:     Effort: Pulmonary effort is normal. No respiratory distress.     Breath sounds: Normal breath sounds. No decreased breath sounds or wheezing.  Abdominal:     Palpations: Abdomen is soft.     Tenderness: There is no abdominal tenderness.  Musculoskeletal:        General: No swelling.     Cervical back: Normal range of motion and neck supple.  Skin:    General: Skin is warm and dry.     Capillary Refill: Capillary refill takes less than 2 seconds.  Neurological:     General: No focal deficit present.     Mental Status: He is alert.  Psychiatric:        Mood and Affect: Mood normal.     ED Results / Procedures / Treatments   Labs (all labs ordered are listed, but only abnormal results are displayed) Labs Reviewed  BASIC METABOLIC PANEL - Abnormal; Notable for the following components:      Result Value   Calcium 8.8 (*)    All other components within normal limits  CBC - Abnormal; Notable for the following components:   WBC 13.3 (*)    All other components within normal limits  TROPONIN I (HIGH SENSITIVITY)  TROPONIN I (HIGH SENSITIVITY)    EKG EKG Interpretation Date/Time:  Saturday May 07 2023 06:14:36 EDT Ventricular Rate:  70 PR Interval:  135 QRS Duration:  81 QT Interval:  354 QTC Calculation: 382 R Axis:   55  Text Interpretation: Sinus rhythm Multiple ventricular premature complexes Low voltage, precordial leads When compared with ECG of 09/07/2022, Premature ventricular complexes are now present Confirmed by Dione Booze ([id number]) on 05/07/2023 6:23:27 AM  Radiology DG Chest Port 1 View  Result Date: 05/07/2023 CLINICAL DATA:  52 year old male with history of chest pain. EXAM: PORTABLE CHEST 1 VIEW COMPARISON:   Chest x-ray 09/07/2022. FINDINGS: Lung volumes are normal. No consolidative airspace disease. No pleural effusions. No pneumothorax. No pulmonary nodule or mass noted. Pulmonary vasculature and the cardiomediastinal silhouette are within normal limits. IMPRESSION: No radiographic evidence of acute cardiopulmonary disease. Electronically Signed   By: Trudie Reed M.D.   On: 05/07/2023 06:34    Procedures Procedures    Medications Ordered in ED Medications - No data to display  ED Course/ Medical Decision Making/ A&P  Medical Decision Making Amount and/or Complexity of Data Reviewed Labs: ordered.   Ryken Paschal is here with chest pain.  History of high cholesterol, hypertension, CLL.  On blood thinner.  He is asymptomatic.  Well-appearing.  Had brief episode of chest discomfort earlier this evening.  This is about 6 hours ago.  EKG shows sinus rhythm.  No ischemic changes.  He had coronary CT a few months ago that showed some mild CAD.  Calcium score is high.  However he is not having any exertional chest pain.  He has not had any chest discomfort.  These last few months.  He is on medical management.  He has had a heart cath.  Overall I have no concern for PE.  I will check basic labs including troponin and chest x-ray.  Differential diagnosis likely muscular versus stress related process but will evaluate for ACS infectious process anemia.  Have no concern for dissection.  He is on blood thinner and doubt PE.  Troponin negative x 2.  No significant anemia or electrolyte abnormality kidney injury per my review interpretation of labs.  Chest x-ray with no evidence of pneumonia or pneumothorax per my review and interpretation.  Overall workup is unremarkable.  He is asymptomatic.  Will have him follow-upHis cardiology and primary care.  He understands return precautions.  Discharged in good condition.  Ultimately atypical story for ACS or other acute  emergent process at this time.  This chart was dictated using voice recognition software.  Despite best efforts to proofread,  errors can occur which can change the documentation meaning.          Final Clinical Impression(s) / ED Diagnoses Final diagnoses:  Atypical chest pain    Rx / DC Orders ED Discharge Orders     None         Virgina Norfolk, DO 05/07/23 [phone]

## 2023-05-11 ENCOUNTER — Ambulatory Visit (INDEPENDENT_AMBULATORY_CARE_PROVIDER_SITE_OTHER): Payer: BC Managed Care – PPO | Admitting: Family Medicine

## 2023-05-11 ENCOUNTER — Encounter: Payer: Self-pay | Admitting: Family Medicine

## 2023-05-11 VITALS — BP 98/64 | HR 57 | Temp 97.8°F | Ht 69.0 in | Wt 215.8 lb

## 2023-05-11 DIAGNOSIS — M62838 Other muscle spasm: Secondary | ICD-10-CM

## 2023-05-11 DIAGNOSIS — Z23 Encounter for immunization: Secondary | ICD-10-CM | POA: Diagnosis not present

## 2023-05-11 DIAGNOSIS — E291 Testicular hypofunction: Secondary | ICD-10-CM | POA: Diagnosis not present

## 2023-05-11 DIAGNOSIS — E1165 Type 2 diabetes mellitus with hyperglycemia: Secondary | ICD-10-CM | POA: Diagnosis not present

## 2023-05-11 DIAGNOSIS — Z7984 Long term (current) use of oral hypoglycemic drugs: Secondary | ICD-10-CM

## 2023-05-11 DIAGNOSIS — E119 Type 2 diabetes mellitus without complications: Secondary | ICD-10-CM

## 2023-05-11 DIAGNOSIS — R7989 Other specified abnormal findings of blood chemistry: Secondary | ICD-10-CM

## 2023-05-11 LAB — POCT GLYCOSYLATED HEMOGLOBIN (HGB A1C): Hemoglobin A1C: 6.1 % — AB (ref 4.0–5.6)

## 2023-05-11 LAB — TESTOSTERONE: Testosterone: 610.01 ng/dL (ref 300.00–890.00)

## 2023-05-11 MED ORDER — XYOSTED 100 MG/0.5ML ~~LOC~~ SOAJ
100.0000 mg | SUBCUTANEOUS | 2 refills | Status: DC
Start: 2023-05-11 — End: 2023-09-06

## 2023-05-11 MED ORDER — METFORMIN HCL ER 500 MG PO TB24
500.0000 mg | ORAL_TABLET | Freq: Every day | ORAL | 1 refills | Status: DC
Start: 2023-05-11 — End: 2023-10-07

## 2023-05-11 MED ORDER — CYCLOBENZAPRINE HCL 10 MG PO TABS
10.0000 mg | ORAL_TABLET | Freq: Every day | ORAL | 2 refills | Status: AC
Start: 2023-05-11 — End: ?

## 2023-05-11 NOTE — Patient Instructions (Signed)
Just a friendly reminder to schedule your diabetic eye exam!

## 2023-05-11 NOTE — Progress Notes (Signed)
Established Patient Office Visit  Subjective   Patient ID: Terry [name], male    DOB: 02-Apr-1971  Age: 52 y.o. MRN: [id number]  Chief Complaint  Patient presents with   Medical Management of Chronic Issues    Pt is here for follow up today. States that he had an episode of chest pain about 4 days ago, states that he was at work and it made him very nervous, almost like a panic attack. He was checked out and was told he was ok, released from the ER. He has not had any chest pain since then.  Low T-- pt reports he is doing ok on the current dose, took his shot on Sunday, needs a new level drawn today.   Pt reports he is having a neck problem now. States that he is waking up with a "crick" in his neck however it is not going away. States that it lasts all day long, worsens the longer he is at work. States that it is mostly on the left. States that he is having some numbness in his right fingers.     Current Outpatient Medications  Medication Instructions   albuterol (VENTOLIN HFA) 108 (90 Base) MCG/ACT inhaler INHALE 1 TO 2 PUFFS INTO THE LUNGS EVERY 6 HOURS AS NEEDED FOR WHEEZING OR SHORTNESS OF BREATH   blood glucose meter kit and supplies KIT Dispense based on patient and insurance preference. Use up to four times daily as directed.   Blood Pressure Monitoring (COMFORT TOUCH BP CUFF/LARGE) MISC 1 each, Does not apply, Daily PRN   cyclobenzaprine (FLEXERIL) 10 mg, Oral, Daily at bedtime   lisinopril (ZESTRIL) 10 mg, Oral, Daily   Magnesium 400 mg, Oral, Daily   metFORMIN (GLUCOPHAGE-XR) 500 mg, Oral, Daily with breakfast   methadone (DOLOPHINE) 85 mg, Oral, Daily   multivitamin-iron-minerals-folic acid (CENTRUM) chewable tablet 1 tablet, Oral, Daily   Polyethylene Glycol 3350 (MIRALAX PO) 238 g, Oral   rivaroxaban (XARELTO) 20 mg, Oral, Daily with supper   rosuvastatin (CRESTOR) 10 MG tablet TAKE 1 TABLET BY MOUTH DAILY   Semaglutide (1 MG/DOSE) 1 mg, Injection, Weekly    sildenafil (VIAGRA) 100 MG tablet TAKE 1/2 TABLET(50 MG) BY MOUTH DAILY AS NEEDED FOR ERECTILE DYSFUNCTION   Spacer/Aero-Holding Chambers (E-Z SPACER) inhaler Use as instructed   Spacer/Aero-Holding Chambers DEVI 1 Device, Does not apply, Daily PRN   tamsulosin (FLOMAX) 0.4 mg, Oral, Daily   valACYclovir (VALTREX) 1000 MG tablet Take 2 tablets PO BID x 1 day at onset of symptoms.   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   Paroxysmal atrial fibrillation (HCC) 12/15/2021   Class 2 drug-induced obesity with serious comorbidity and body mass index (BMI) of 36.0 to 36.9 in adult 12/15/2021   Type 2 diabetes mellitus without complication, without long-term current use of insulin (HCC) 12/15/2021   At risk for central sleep apnea 12/15/2021   Non-restorative sleep 12/15/2021   Diabetes mellitus type II, controlled (HCC) 12/02/2021   CLL (chronic lymphocytic leukemia) (HCC) 12/06/2019   Opioid dependence (HCC) 07/12/2019   Restless leg syndrome 07/03/2019   Chronic insomnia 07/03/2019   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     Objective:     BP 98/64 (BP Location: Left Arm, Patient Position: Sitting, Cuff Size: Large)   Pulse (!) 57   Temp 97.8 F (36.6 C) (Oral)   Ht 5\' 9"  (1.753 m)   Wt 215 lb 12.8 oz (97.9 kg)   SpO2 99%   BMI 31.87 kg/m    Physical Exam Vitals reviewed.  Constitutional:      Appearance: Normal appearance. He is well-groomed. He is obese.   Eyes:     Extraocular Movements: Extraocular movements intact.     Conjunctiva/sclera: Conjunctivae normal.  Neck:     Thyroid: No thyromegaly.  Cardiovascular:     Rate and Rhythm: Normal rate and regular rhythm.     Heart sounds: S1 normal and S2 normal. No murmur heard. Pulmonary:     Effort: Pulmonary effort is normal.     Breath sounds: Normal breath sounds and air entry. No rales.  Musculoskeletal:     Right lower leg: No edema.     Left lower leg: No edema.  Neurological:     General: No focal deficit present.     Mental Status: He is alert and oriented to person, place, and time.     Gait: Gait is intact.  Psychiatric:        Mood and Affect: Mood and affect normal.      Results for orders placed or performed in visit on 05/11/23  Testosterone  Result Value Ref Range   Testosterone 610.01 300.00 - 890.00 ng/dL  POC HgB H0Q  Result Value Ref Range   Hemoglobin A1C 6.1 (A) 4.0 - 5.6 %   HbA1c POC (<> result, manual entry)     HbA1c, POC (prediabetic range)     HbA1c, POC (controlled diabetic range)        The ASCVD Risk score (Arnett DK, et al., 2019) failed to calculate for the following reasons:   The valid total cholesterol range is 130 to 320 mg/dL    Assessment & Plan:  Controlled type 2 diabetes mellitus with hyperglycemia, without long-term current use of insulin (HCC) Assessment & Plan: A1C is well controlled on the semaglutide and metformin, continue as prescribed  Orders: -     POCT glycosylated hemoglobin (Hb A1C) -     metFORMIN HCl ER; Take 1 tablet (500 mg total) by mouth daily with breakfast.  Dispense: 90 tablet; Refill: 1  Testicular hypofunction Assessment & Plan: Doing well on the current 100 mg dose every week, will check new testosterone level today since he is mid-week on his injection.  Orders: -     Testosterone -     Xyosted; Inject 0.5 mLs (100 mg total) into the skin once a week.  Dispense: 2 mL; Refill: 2  Muscle spasms of  neck Likely stiff muscles, will treat with cyclobenzaprine at bedtime to help.   -     Cyclobenzaprine HCl; Take 1 tablet (10 mg total) by mouth at bedtime.  Dispense: 30 tablet; Refill: 2  Need for immunization against influenza -     Flu vaccine trivalent PF, 6mos and older(Flulaval,Afluria,Fluarix,Fluzone)     Return in about 3 months (around 08/11/2023) for med refills-- ok to be a video visit.    Karie Georges, MD

## 2023-05-17 DIAGNOSIS — E291 Testicular hypofunction: Secondary | ICD-10-CM | POA: Insufficient documentation

## 2023-05-17 NOTE — Assessment & Plan Note (Signed)
Doing well on the current 100 mg dose every week, will check new testosterone level today since he is mid-week on his injection.

## 2023-05-17 NOTE — Assessment & Plan Note (Signed)
A1C is well controlled on the semaglutide and metformin, continue as prescribed

## 2023-05-17 NOTE — Assessment & Plan Note (Signed)
Needs another testosterone level today, will refill his xyosted 100 mg once a week.

## 2023-06-15 ENCOUNTER — Encounter: Payer: Self-pay | Admitting: Family Medicine

## 2023-06-15 ENCOUNTER — Ambulatory Visit (INDEPENDENT_AMBULATORY_CARE_PROVIDER_SITE_OTHER): Payer: BC Managed Care – PPO | Admitting: Family Medicine

## 2023-06-15 VITALS — BP 102/68 | HR 65 | Temp 97.8°F | Ht 69.0 in | Wt 216.5 lb

## 2023-06-15 DIAGNOSIS — K047 Periapical abscess without sinus: Secondary | ICD-10-CM

## 2023-06-15 MED ORDER — CLINDAMYCIN HCL 300 MG PO CAPS: 300.0000 mg | ORAL_CAPSULE | Freq: Three times a day (TID) | ORAL | 0 refills | Status: AC

## 2023-06-15 NOTE — Progress Notes (Signed)
   Acute Office Visit  Subjective:     Patient ID: Terry [name], male    DOB: 09/09/1970, 52 y.o.   MRN: [id number]  Chief Complaint  Patient presents with   Jaw Pain    Patient complains of left-sided jaw pain, radiating to the left side of his neck x3 days    HPI Patient is in today for left sided jaw pain about 3 days ago and now it has moved down into his left neck. He states that he does have a broken tooth in the back and he had a root canal done about 1 1/2 years ago, states that he has not had any fever or chills. Pt was concerned because of his CLL and was worried about an enlarged lymph node.   Review of Systems  All other systems reviewed and are negative.       Objective:    BP 102/68 (BP Location: Left Arm, Patient Position: Sitting, Cuff Size: Large)   Pulse 65   Temp 97.8 F (36.6 C) (Oral)   Ht 5\' 9"  (1.753 m)   Wt 216 lb 8 oz (98.2 kg)   SpO2 97%   BMI 31.97 kg/m    Physical Exam Vitals reviewed.  Constitutional:      Appearance: Normal appearance. He is obese.  HENT:     Right Ear: Tympanic membrane normal.     Left Ear: Tympanic membrane normal.     Mouth/Throat:     Dentition: Abnormal dentition. Gingival swelling (around the left back molar there is a extra piece of gum tissue that is inflamed) present.  Neck:     Comments: There is also pain along the mid-jaw line with palpation, no masses or lumps felt Pulmonary:     Effort: Pulmonary effort is normal.     Breath sounds: Normal breath sounds.  Musculoskeletal:     Cervical back: Normal range of motion. Pain with movement (turnig head to the left) and muscular tenderness (left sternocleidomastoid) present.  Lymphadenopathy:     Cervical:     Left cervical: No superficial or deep cervical adenopathy.  Neurological:     Mental Status: He is alert.     No results found for any visits on 06/15/23.      Assessment & Plan:   Problem List Items Addressed This Visit   None Visit  Diagnoses     Dental infection    -  Primary   Relevant Medications   clindamycin (CLEOCIN) 300 MG capsule     Possible diagnosis, the rest of the physical exam was benign, no masses or LAD felt, I recommend treating with a course of antibiotics for 7 days to see if this will resolve the pain. I advised he schedule an appointment with the dentist as well for panoramic x-ray. If this does not improve his pain we may need to get a US soft tissue of neck.  Meds ordered this encounter  Medications   clindamycin (CLEOCIN) 300 MG capsule    Sig: Take 1 capsule (300 mg total) by mouth 3 (three) times daily for 7 days.    Dispense:  21 capsule    Refill:  0    No follow-ups on file.  Karie Georges, MD

## 2023-06-25 ENCOUNTER — Telehealth: Payer: Self-pay | Admitting: Hematology and Oncology

## 2023-07-07 ENCOUNTER — Ambulatory Visit: Payer: BC Managed Care – PPO | Admitting: Hematology and Oncology

## 2023-07-07 ENCOUNTER — Other Ambulatory Visit: Payer: BC Managed Care – PPO

## 2023-07-18 ENCOUNTER — Other Ambulatory Visit: Payer: Self-pay | Admitting: Hematology and Oncology

## 2023-07-18 ENCOUNTER — Inpatient Hospital Stay: Payer: BC Managed Care – PPO | Attending: Internal Medicine

## 2023-07-18 ENCOUNTER — Inpatient Hospital Stay: Payer: BC Managed Care – PPO | Admitting: Hematology and Oncology

## 2023-07-18 DIAGNOSIS — C9110 Chronic lymphocytic leukemia of B-cell type not having achieved remission: Secondary | ICD-10-CM

## 2023-07-18 NOTE — Progress Notes (Unsigned)
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 MCV 84.2. ANC 8600, ALC 1500 2) 10/13/2018: WBC 11.9, Hgb 13.8, Plt 342. MCV 84.7. ANC 5200, ALC 5000 3) 07/11/2019: WBC 11.8, Hgb 14.2, Plt 357, MCV 86.1. ANC 5300, ALC 5200 4) 07/25/2019: establish care with Dr. Leonides Schanz. Flow cytometry showed monoclonal B-cell population without expression of CD5 or CD10 comprises 59% of all lymphocytes. 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  5) 08/20/2019: CLL panel returned positive for 17p13 deletion and no evidence of t(11:14). Findings consistent with diagnosis of CLL. Entered in active surveillance.   Interval History:  Rodric Hamper 52 y.o. male with medical history significant for CLL Rai Stage 0 presents for a follow up visit. The patient's last visit was on 01/04/2022. In the interim since the last visit he has had no major changes in his health.   On exam today Mr. Faillace reports he quit taking his Ozempic in the interim since her last visit because it was causing nausea and vomiting.  He reports that it is mostly because nausea throughout the mornings.  His weight has declined steadily from 240 pounds down to 210 pounds today.  He reports that despite having nausea and vomiting he does not have any diarrhea.  He does have some "migrating chest pain" which comes and goes.  He does follow with cardiology for his atrial fibrillation and is being evaluated by them for his chest pain.  He reports that his goal is to get down to 200 pounds.  He otherwise denies having issues with fevers, chills, sweats, nausea, vomiting or diarrhea.  He denies having any palpable lymphadenopathy.  A full  10 point ROS is listed below.  MEDICAL HISTORY:  Past Medical History:  Diagnosis Date   Asthma    Cancer (HCC)    Diabetes mellitus without complication (HCC)    GERD (gastroesophageal reflux disease)    Hyperlipidemia    Hypertension    Opioid abuse (HCC)    following with methadone clinic   Pneumonia     SURGICAL HISTORY: Past Surgical History:  Procedure Laterality Date   right bicep Right 2013   TRICEPS TENDON REPAIR Left 04/2013   ALLERGIES:  has no known allergies.  MEDICATIONS:  Current Outpatient Medications  Medication Sig Dispense Refill   albuterol (VENTOLIN HFA) 108 (90 Base) MCG/ACT inhaler INHALE 1 TO 2 PUFFS INTO THE LUNGS EVERY 6 HOURS AS NEEDED FOR WHEEZING OR SHORTNESS OF BREATH 20.1 g 1   blood glucose meter kit and supplies KIT Dispense based on patient and insurance preference. Use up to four times daily as directed. 1 each 0   Blood Pressure Monitoring (COMFORT TOUCH BP CUFF/LARGE) MISC 1 each by Does not apply route daily as needed. 1 each 0   cyclobenzaprine (FLEXERIL) 10 MG tablet Take 1 tablet (10 mg total) by mouth at bedtime. 30 tablet 2   lisinopril (ZESTRIL) 20 MG tablet Take 0.5 tablets (10 mg total) by mouth daily. 45 tablet 1   Magnesium 400 MG TABS Take 400 mg by mouth daily.     metFORMIN (GLUCOPHAGE-XR) 500 MG 24 hr tablet Take 1 tablet (500 mg total) by mouth daily with breakfast. 90 tablet 1  methadone (DOLOPHINE) 10 MG/ML solution Take 85 mg by mouth daily.      multivitamin-iron-minerals-folic acid (CENTRUM) chewable tablet Chew 1 tablet by mouth daily.     Polyethylene Glycol 3350 (MIRALAX PO) Take 238 g by mouth.     rivaroxaban (XARELTO) 20 MG TABS tablet Take 1 tablet (20 mg total) by mouth daily with supper. 90 tablet 3   rosuvastatin (CRESTOR) 10 MG tablet TAKE 1 TABLET BY MOUTH DAILY 90 tablet 1   Semaglutide, 1 MG/DOSE, 4 MG/3ML SOPN Inject 1 mg as directed once a week. 9 mL 3   sildenafil (VIAGRA) 100 MG tablet TAKE 1/2  TABLET(50 MG) BY MOUTH DAILY AS NEEDED FOR ERECTILE DYSFUNCTION 30 tablet 0   Spacer/Aero-Holding Chambers (E-Z SPACER) inhaler Use as instructed 1 each 2   Spacer/Aero-Holding Chambers DEVI 1 Device by Does not apply route daily as needed. 1 each 0   tamsulosin (FLOMAX) 0.4 MG CAPS capsule Take 1 capsule (0.4 mg total) by mouth daily. 90 capsule 0   Testosterone Enanthate (XYOSTED) 100 MG/0.5ML SOAJ Inject 0.5 mLs (100 mg total) into the skin once a week. 2 mL 2   valACYclovir (VALTREX) 1000 MG tablet Take 2 tablets PO BID x 1 day at onset of symptoms. 30 tablet 0   No current facility-administered medications for this visit.    REVIEW OF SYSTEMS:   Constitutional: ( - ) fevers, ( - )  chills , ( - ) night sweats Eyes: ( - ) blurriness of vision, ( - ) double vision, ( - ) watery eyes Ears, nose, mouth, throat, and face: ( - ) mucositis, ( - ) sore throat Respiratory: ( - ) cough, ( - ) dyspnea, ( - ) wheezes Cardiovascular: ( - ) palpitation, ( - ) chest discomfort, ( - ) lower extremity swelling Gastrointestinal:  ( - ) nausea, ( - ) heartburn, ( - ) change in bowel habits Skin: ( - ) abnormal skin rashes Lymphatics: ( - ) new lymphadenopathy, ( - ) easy bruising Neurological: ( - ) numbness, ( - ) tingling, ( - ) new weaknesses Behavioral/Psych: ( - ) mood change, ( - ) new changes  All other systems were reviewed with the patient and are negative.  PHYSICAL EXAMINATION: ECOG PERFORMANCE STATUS: 0 - Asymptomatic  There were no vitals filed for this visit.    There were no vitals filed for this visit.     GENERAL: well appearing middle aged male in NAD  SKIN: skin color, texture, turgor are normal, no rashes or significant lesions EYES: conjunctiva are pink and non-injected, sclera clear NECK: supple, non-tender  LYMPH:  no palpable lymphadenopathy in the cervical, axillary or supraclavicular lymph nodes.  LUNGS: clear to auscultation and percussion with normal breathing  effort HEART: regular rate & rhythm and no murmurs and +1 bilateral lower extremity edema ABDOMEN: limited due to body habitus.  Musculoskeletal: no cyanosis of digits and no clubbing  PSYCH: alert & oriented x 3, fluent speech NEURO: no focal motor/sensory deficits  LABORATORY DATA:  I have reviewed the data as listed    Latest Ref Rng & Units 05/07/2023    6:31 AM 09/07/2022    7:53 PM 07/05/2022   11:13 AM  CBC  WBC 4.0 - 10.5 K/uL 13.3  13.9  12.1   Hemoglobin 13.0 - 17.0 g/dL 46.9  62.9  52.8   Hematocrit 39.0 - 52.0 % 45.5  48.9  42.7   Platelets 150 - 400 K/uL 297  304  293        Latest Ref Rng & Units 05/07/2023    6:31 AM 03/31/2023    8:56 AM 01/04/2023    4:40 PM  CMP  Glucose 70 - 99 mg/dL 97  244  87   BUN 6 - 20 mg/dL 18  18  16    Creatinine 0.61 - 1.24 mg/dL 0.10  2.72  5.36   Sodium 135 - 145 mmol/L 135  139  140   Potassium 3.5 - 5.1 mmol/L 4.1  4.6  4.8   Chloride 98 - 111 mmol/L 98  102  102   CO2 22 - 32 mmol/L 27  27  27    Calcium 8.9 - 10.3 mg/dL 8.8  9.5  64.4   Total Protein 6.0 - 8.5 g/dL  6.9    Total Bilirubin 0.0 - 1.2 mg/dL  0.9    Alkaline Phos 44 - 121 IU/L  72    AST 0 - 40 IU/L  16    ALT 0 - 44 IU/L  19      BLOOD FILM: None today.   RADIOGRAPHIC STUDIES: I have personally reviewed the radiological images as listed and agreed with the findings in the report: no evidence of lymphadenopathy or splenomegaly.   No results found.  ASSESSMENT & PLAN Percey Authier 52 y.o. male with medical history significant for CLL Rai Stage 0 presents for a follow up visit. The patient's last visit was on 07/25/2019 at which time he established care in our clinic.  After review of the imaging and the lab work the patient's findings are most consistent with CLL.  Given that he only has a lymphocytosis this would be considered a CLL Rai stage 0.  There is no clear indication for treatment at this time and I would recommend that he enter into active  surveillance with close clinical monitoring and strict return precautions.  Today Mr. Thwing notes he has been well in the interim since his last visit. He was recently diagnosed with atrial fibrillation and started on Eliquis, but has been in his normal state of health otherwise. He is asymptomatic with no unintentional weight loss, fevers, chills, sweats, or new lymphadenopathy.   Previously we discussed the nature of CLL, the relatively good prognosis, the meaning of his a 17p13 deletion, and possible treatments moving forward.  We also discussed that this tends to be a more indolent cancer and that close monitoring which is appropriate at this time.  We also discussed the criteria for which we would want to start treatment including bulky lymphadenopathy, anemia, or thrombocytopenia.  Mr. Sudler and his wife expressed their understanding of the disease and the plan moving forward.  In the event treatment is required could consider therapy with ibrutinib +/- rituximab, Acalabrutinib +/- obinutuzumab, or Venetoclax +obinutuzumab. Of these treatment options only Venetoclax/obinutuzumab would be of limited duration (12 months). We can discuss these treatments in detail when the time comes.   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 Will continue to monitor in clinic with serial visits and blood work to assess for progression of the disease.  -- Today labs show white blood cell count *** --plan for labs q6 months with CBC, CMP, LDH --Plan to have patient return in 12 months with PRN visits if new symptoms were to develop.   #Atrial Fibrillation --currently on eliquis therapy 5mg  BID  No orders of the defined types  were placed in this encounter.   All questions were answered. The patient knows to call the clinic with any problems, questions or concerns.  A total of more than 30 minutes were spent on this encounter and over half of  that time was spent on counseling and coordination of care as outlined above.   Ulysees Barns, MD Department of Hematology/Oncology [facility] Phone: [phone] Pager: [phone] Email: [email or web address]  07/18/2023 7:12 AM

## 2023-08-11 ENCOUNTER — Other Ambulatory Visit: Payer: Self-pay | Admitting: Family Medicine

## 2023-08-11 ENCOUNTER — Ambulatory Visit: Payer: BC Managed Care – PPO | Admitting: Family Medicine

## 2023-08-19 ENCOUNTER — Other Ambulatory Visit: Payer: Self-pay | Admitting: Family Medicine

## 2023-08-19 DIAGNOSIS — E7800 Pure hypercholesterolemia, unspecified: Secondary | ICD-10-CM

## 2023-09-06 ENCOUNTER — Other Ambulatory Visit: Payer: Self-pay | Admitting: Family Medicine

## 2023-09-06 DIAGNOSIS — E291 Testicular hypofunction: Secondary | ICD-10-CM

## 2023-09-21 NOTE — Progress Notes (Deleted)
 Cardiology Office Note:  .   Date:  09/21/2023  ID:  Terry [name], DOB [birth year], MRN [id number] PCP: Karie Georges, MD  [facility] Providers Cardiologist:  Orbie Pyo, MD { Click to update primary MD,subspecialty MD or APP then REFRESH:1}   Patient Profile: .      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 Lynnette Caffey on 12/03/2021.  He works full-time as a Estate agent.  He was doing very well at that time and plan was to get echo and continue current therapies with 1 year follow-up.  2D echo 12/21/2021 revealed normal LVEF 60 to 65%, G1 DD, mildly dilated LA, moderately dilated RA, no significant valve disease.  Seen in clinic by me on 01/04/2023 with reported concerns about his heart.  Over the previous few months he had had 2 or 3 ED visits for chest pain and was concerned about possible heart attack or stroke.  At that visit he continued to have occasional left-sided chest pain.  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  He reported some shortness of breath with activity.  He was diagnosed with OSA and advised to get CPAP per neurology.  Coronary CTA was obtained for ischemia evaluation and revealed CAC score of 320 (99th percentile), mild nonobstructive CAD with distribution of calcium mostly in LAD and RCA, small amount in LCx.  He had been started on rosuvastatin 10 mg daily by PCP.  Last cardiology clinic visit was 03/31/2023 with me.  He had just gotten off work.  Works in alternating schedule of 12-hour day and night shifts.  He reported occasional chest pain that is short lasting and not associated with additional symptoms of shortness of breath, diaphoresis, or n/v.  Reported he is never aware of being out of  rhythm.  He admitted he was not taking Eliquis twice daily due to his work schedule.  At times he had noted low SBP in the 80s.  He was not particularly symptomatic with these episodes but was concerned.  He has held his lisinopril on occasion and then BP goes up to 140s systolic.  We transition him to Xarelto 20 mg daily for stroke prevention for PAF due to difficulty taking Eliquis twice daily.  Encouraged home monitor such as Lourena Simmonds or smart watch to monitor for A-fib.  Encouraged compliance with CPAP.  Lipid panel completed at that visit revealed LDL 69, HDL 37, triglycerides 79, and total cholesterol 122.  He was encouraged to continue lifestyle modification to include regular exercise and heart healthy diet.       History of Present Illness: .   Monico Sudduth is a *** 53 y.o. male who is here today for follow-up of PAF and non-obstructive CAD.    Discussed the use of AI scribe software for clinical note transcription with the patient, who gave verbal consent to proceed.   ROS: ***       Studies Reviewed: .        *** Risk Assessment/Calculations:    CHA2DS2-VASc Score = 2  {Confirm score is correct.  If not, click here to update score.  REFRESH note.  :1} This indicates a 2.2% annual risk of stroke. The patient's score is based upon: CHF History: 0 HTN History: 1 Diabetes History: 1  Stroke History: 0 Vascular Disease History: 0 Age Score: 0 Gender Score: 0   {This patient has a significant risk of stroke if diagnosed with atrial fibrillation.  Please consider VKA or DOAC agent for anticoagulation if the bleeding risk is acceptable.   You can also use the SmartPhrase .[email or web address] for documentation.   :[id number]} No BP recorded.  {Refresh Note OR Click here to enter BP  :1}***       Physical Exam:   VS:  There were no vitals taken for this visit.   Wt Readings from Last 3 Encounters:  06/15/23 216 lb 8 oz (98.2 kg)  05/11/23 215 lb 12.8 oz (97.9 kg)  05/07/23 210 lb  (95.3 kg)    GEN: Well nourished, well developed in no acute distress NECK: No JVD; No carotid bruits CARDIAC: ***RRR, no murmurs, rubs, gallops RESPIRATORY:  Clear to auscultation without rales, wheezing or rhonchi  ABDOMEN: Soft, non-tender, non-distended EXTREMITIES:  No edema; No deformity     ASSESSMENT AND PLAN: .    CAD: Mild non-obstructive CAD, elevated CAC score on coronary CT 01/2023.  PAF on chronic anticoagulation: Hyperlipidemia LDL goal < 70: Primary hypertension:     {Are you ordering a CV Procedure (e.g. stress test, cath, DCCV, TEE, etc)?   Press F2        :[id number]}  Disposition:***  Signed, Eligha Bridegroom, NP-C

## 2023-09-26 ENCOUNTER — Ambulatory Visit: Payer: BC Managed Care – PPO | Attending: Nurse Practitioner | Admitting: Nurse Practitioner

## 2023-09-27 ENCOUNTER — Other Ambulatory Visit: Payer: Self-pay | Admitting: Family Medicine

## 2023-09-27 ENCOUNTER — Encounter: Payer: Self-pay | Admitting: Nurse Practitioner

## 2023-09-27 DIAGNOSIS — E7800 Pure hypercholesterolemia, unspecified: Secondary | ICD-10-CM

## 2023-09-27 NOTE — Telephone Encounter (Signed)
 Last Fill: 08/19/23 30 tabs/0 RF  Last OV: 06/15/23 Next OV: 10/07/23  Routing to provider for review/authorization.

## 2023-09-27 NOTE — Telephone Encounter (Signed)
 Copied from CRM [id number]. Topic: Clinical - Medication Refill >> Sep 27, 2023  3:56 PM Sim Boast F wrote: Most Recent Primary Care Visit:  Provider: Karie Georges  Department: [facility]  Visit Type: OFFICE VISIT  Date: 06/15/2023  Medication: Rosuvastatin (CRESTOR) 10 MG tablet  Has the patient contacted their pharmacy? Yes (Agent: If no, request that the patient contact the pharmacy for the refill. If patient does not wish to contact the pharmacy document the reason why and proceed with request.) (Agent: If yes, when and what did the pharmacy advise?)  Is this the correct pharmacy for this prescription? Yes If no, delete pharmacy and type the correct one.  This is the patient's preferred pharmacy:   [id number] - [place], [place] - [address] AT [facility] [address] [place] [address] Phone: [phone] Fax: [phone]    Has the prescription been filled recently? Yes  Is the patient out of the medication? No, patient has 2 pills left    Has the patient been seen for an appointment in the last year OR does the patient have an upcoming appointment? Yes, patient has appointment Friday, 10/07/23   Can we respond through MyChart? No  Agent: Please be advised that Rx refills may take up to 3 business days. We ask that you follow-up with your pharmacy.

## 2023-09-28 MED ORDER — ROSUVASTATIN CALCIUM 10 MG PO TABS: 10.0000 mg | ORAL_TABLET | Freq: Every day | ORAL | 5 refills | Status: DC

## 2023-09-29 ENCOUNTER — Telehealth: Payer: Self-pay | Admitting: Hematology and Oncology

## 2023-09-30 ENCOUNTER — Inpatient Hospital Stay: Payer: BC Managed Care – PPO | Admitting: Hematology and Oncology

## 2023-09-30 ENCOUNTER — Inpatient Hospital Stay: Payer: BC Managed Care – PPO | Attending: Internal Medicine

## 2023-09-30 ENCOUNTER — Telehealth: Payer: Self-pay | Admitting: Hematology and Oncology

## 2023-09-30 VITALS — BP 155/95 | HR 79 | Temp 97.1°F | Resp 15 | Wt 218.6 lb

## 2023-09-30 DIAGNOSIS — D72820 Lymphocytosis (symptomatic): Secondary | ICD-10-CM

## 2023-09-30 DIAGNOSIS — I4891 Unspecified atrial fibrillation: Secondary | ICD-10-CM | POA: Diagnosis not present

## 2023-09-30 DIAGNOSIS — Z79624 Long term (current) use of inhibitors of nucleotide synthesis: Secondary | ICD-10-CM | POA: Insufficient documentation

## 2023-09-30 DIAGNOSIS — Z79899 Other long term (current) drug therapy: Secondary | ICD-10-CM | POA: Diagnosis not present

## 2023-09-30 DIAGNOSIS — Z7901 Long term (current) use of anticoagulants: Secondary | ICD-10-CM | POA: Insufficient documentation

## 2023-09-30 DIAGNOSIS — C9110 Chronic lymphocytic leukemia of B-cell type not having achieved remission: Secondary | ICD-10-CM | POA: Diagnosis not present

## 2023-09-30 LAB — CMP (CANCER CENTER ONLY)
ALT: 28 U/L (ref 0–44)
AST: 27 U/L (ref 15–41)
Albumin: 4.5 g/dL (ref 3.5–5.0)
Alkaline Phosphatase: 57 U/L (ref 38–126)
Anion gap: 5 (ref 5–15)
BUN: 16 mg/dL (ref 6–20)
CO2: 33 mmol/L — ABNORMAL HIGH (ref 22–32)
Calcium: 9.4 mg/dL (ref 8.9–10.3)
Chloride: 101 mmol/L (ref 98–111)
Creatinine: 1.19 mg/dL (ref 0.61–1.24)
GFR, Estimated: >60 mL/min (ref >60)
Glucose, Bld: 119 mg/dL — ABNORMAL HIGH (ref 70–99)
Potassium: 4.3 mmol/L (ref 3.5–5.1)
Sodium: 139 mmol/L (ref 135–145)
Total Bilirubin: 0.9 mg/dL (ref 0.0–1.2)
Total Protein: 7.2 g/dL (ref 6.5–8.1)

## 2023-09-30 LAB — CBC WITH DIFFERENTIAL (CANCER CENTER ONLY)
Abs Immature Granulocytes: 0.03 10*3/uL (ref 0.00–0.07)
Basophils Absolute: 0.1 10*3/uL (ref 0.0–0.1)
Basophils Relative: 1 %
Eosinophils Absolute: 0.7 10*3/uL — ABNORMAL HIGH (ref 0.0–0.5)
Eosinophils Relative: 5 %
HCT: 47.3 % (ref 39.0–52.0)
Hemoglobin: 15.7 g/dL (ref 13.0–17.0)
Immature Granulocytes: 0 %
Lymphocytes Relative: 59 %
Lymphs Abs: 8.5 10*3/uL — ABNORMAL HIGH (ref 0.7–4.0)
MCH: 28.4 pg (ref 26.0–34.0)
MCHC: 33.2 g/dL (ref 30.0–36.0)
MCV: 85.7 fL (ref 80.0–100.0)
Monocytes Absolute: 0.6 10*3/uL (ref 0.1–1.0)
Monocytes Relative: 4 %
Neutro Abs: 4.4 10*3/uL (ref 1.7–7.7)
Neutrophils Relative %: 31 %
Platelet Count: 279 10*3/uL (ref 150–400)
RBC: 5.52 MIL/uL (ref 4.22–5.81)
RDW: 12 % (ref 11.5–15.5)
Smear Review: NORMAL
WBC Count: 14.4 10*3/uL — ABNORMAL HIGH (ref 4.0–10.5)
nRBC: 0 % (ref 0.0–0.2)

## 2023-09-30 LAB — LACTATE DEHYDROGENASE: LDH: 191 U/L (ref 98–192)

## 2023-09-30 NOTE — Progress Notes (Signed)
 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 MCV 84.2. ANC 8600, ALC 1500 2) 10/13/2018: WBC 11.9, Hgb 13.8, Plt 342. MCV 84.7. ANC 5200, ALC 5000 3) 07/11/2019: WBC 11.8, Hgb 14.2, Plt 357, MCV 86.1. ANC 5300, ALC 5200 4) 07/25/2019: establish care with Dr. Leonides Schanz. Flow cytometry showed monoclonal B-cell population without expression of CD5 or CD10 comprises 59% of all lymphocytes. 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  5) 08/20/2019: CLL panel returned positive for 17p13 deletion and no evidence of t(11:14). Findings consistent with diagnosis of CLL. Entered in active surveillance.   Interval History:  Terry [name] 53 y.o. male with medical history significant for CLL Rai Stage 0 presents for a follow up visit. The patient's last visit was on 07/05/2022. In the interim since the last visit he has had no major changes in his health.   On exam today Terry [name] reports he has been well overall in the interim since our last visit 1 year ago.  He reports he has had some episodes where he falls down at night particular when walking to the bathroom.  He reports he did have a pinched nerve in his neck and was prescribed muscle relaxers which made the situation worse.  He is also taking his Ozempic and diabetic medications but has not been routinely checking his blood sugar.  He reports he is quite satisfied with his weight and likes it where it is right now.  He reports otherwise his energy levels are good and he is considering taking testosterone shots for a boost.  He reports that he does work on hours working 6 days a week from 7 till 7 and then gets 1 week off.  He reports  that he does have obstructive sleep apnea and is trying to use his CPAP machine.  He did have COVID a few weeks ago and reports that it was tough.  He otherwise denies having issues with fevers, chills, sweats, nausea, vomiting or diarrhea.  He denies having any palpable lymphadenopathy.  A full 10 point ROS is listed below.  MEDICAL HISTORY:  Past Medical History:  Diagnosis Date   Asthma    Cancer (HCC)    Diabetes mellitus without complication (HCC)    GERD (gastroesophageal reflux disease)    Hyperlipidemia    Hypertension    Opioid abuse (HCC)    following with methadone clinic   Pneumonia     SURGICAL HISTORY: Past Surgical History:  Procedure Laterality Date   right bicep Right 2013   TRICEPS TENDON REPAIR Left 04/2013   ALLERGIES:  has no known allergies.  MEDICATIONS:  Current Outpatient Medications  Medication Sig Dispense Refill   albuterol (VENTOLIN HFA) 108 (90 Base) MCG/ACT inhaler INHALE 1 TO 2 PUFFS INTO THE LUNGS EVERY 6 HOURS AS NEEDED FOR WHEEZING OR SHORTNESS OF BREATH 20.1 g 1   blood glucose meter kit and supplies KIT Dispense based on patient and insurance preference. Use up to four times daily as directed. 1 each 0   Blood Pressure Monitoring (COMFORT TOUCH BP CUFF/LARGE) MISC 1 each by Does not apply route daily as needed. 1 each 0   cyclobenzaprine (FLEXERIL) 10 MG tablet Take 1 tablet (10  mg total) by mouth at bedtime. 30 tablet 2   lisinopril (ZESTRIL) 20 MG tablet Take 0.5 tablets (10 mg total) by mouth daily. 45 tablet 1   Magnesium 400 MG TABS Take 400 mg by mouth daily.     metFORMIN (GLUCOPHAGE-XR) 500 MG 24 hr tablet Take 1 tablet (500 mg total) by mouth daily with breakfast. 90 tablet 1   methadone (DOLOPHINE) 10 MG/ML solution Take 85 mg by mouth daily.      multivitamin-iron-minerals-folic acid (CENTRUM) chewable tablet Chew 1 tablet by mouth daily.     Polyethylene Glycol 3350 (MIRALAX PO) Take 238 g by mouth.     rivaroxaban (XARELTO) 20 MG  TABS tablet Take 1 tablet (20 mg total) by mouth daily with supper. 90 tablet 3   rosuvastatin (CRESTOR) 10 MG tablet Take 1 tablet (10 mg total) by mouth daily. 30 tablet 5   Semaglutide, 1 MG/DOSE, 4 MG/3ML SOPN Inject 1 mg as directed once a week. 9 mL 3   sildenafil (VIAGRA) 100 MG tablet TAKE 1/2 TABLET(50 MG) BY MOUTH DAILY AS NEEDED FOR ERECTILE DYSFUNCTION 30 tablet 0   Spacer/Aero-Holding Chambers (E-Z SPACER) inhaler Use as instructed 1 each 2   Spacer/Aero-Holding Chambers DEVI 1 Device by Does not apply route daily as needed. 1 each 0   tamsulosin (FLOMAX) 0.4 MG CAPS capsule TAKE 1 CAPSULE(0.4 MG) BY MOUTH DAILY 90 capsule 0   Testosterone Enanthate (XYOSTED) 100 MG/0.5ML SOAJ INJECT 0.5 ML( 100 MG TOTAL) UNDER THE SKIN ONCE A WEEK 2 mL 2   valACYclovir (VALTREX) 1000 MG tablet Take 2 tablets PO BID x 1 day at onset of symptoms. 30 tablet 0   No current facility-administered medications for this visit.    REVIEW OF SYSTEMS:   Constitutional: ( - ) fevers, ( - )  chills , ( - ) night sweats Eyes: ( - ) blurriness of vision, ( - ) double vision, ( - ) watery eyes Ears, nose, mouth, throat, and face: ( - ) mucositis, ( - ) sore throat Respiratory: ( - ) cough, ( - ) dyspnea, ( - ) wheezes Cardiovascular: ( - ) palpitation, ( - ) chest discomfort, ( - ) lower extremity swelling Gastrointestinal:  ( - ) nausea, ( - ) heartburn, ( - ) change in bowel habits Skin: ( - ) abnormal skin rashes Lymphatics: ( - ) new lymphadenopathy, ( - ) easy bruising Neurological: ( - ) numbness, ( - ) tingling, ( - ) new weaknesses Behavioral/Psych: ( - ) mood change, ( - ) new changes  All other systems were reviewed with the patient and are negative.  PHYSICAL EXAMINATION: ECOG PERFORMANCE STATUS: 0 - Asymptomatic  Vitals:   09/30/23 0912  BP: (!) 155/95  Pulse: 79  Resp: 15  Temp: (!) 97.1 F (36.2 C)  SpO2: 96%      Filed Weights   09/30/23 0912  Weight: 218 lb 9.6 oz (99.2 kg)        GENERAL: well appearing middle aged male in NAD  SKIN: skin color, texture, turgor are normal, no rashes or significant lesions EYES: conjunctiva are pink and non-injected, sclera clear NECK: supple, non-tender  LYMPH:  no palpable lymphadenopathy in the cervical, axillary or supraclavicular lymph nodes.  LUNGS: clear to auscultation and percussion with normal breathing effort HEART: regular rate & rhythm and no murmurs and +1 bilateral lower extremity edema ABDOMEN: limited due to body habitus.  Musculoskeletal: no cyanosis of digits and no clubbing  PSYCH: alert & oriented x 3, fluent speech NEURO: no focal motor/sensory deficits  LABORATORY DATA:  I have reviewed the data as listed    Latest Ref Rng & Units 09/30/2023    8:35 AM 05/07/2023    6:31 AM 09/07/2022    7:53 PM  CBC  WBC 4.0 - 10.5 K/uL 14.4  13.3  13.9   Hemoglobin 13.0 - 17.0 g/dL 16.1  09.6  04.5   Hematocrit 39.0 - 52.0 % 47.3  45.5  48.9   Platelets 150 - 400 K/uL 279  297  304        Latest Ref Rng & Units 09/30/2023    8:35 AM 05/07/2023    6:31 AM 03/31/2023    8:56 AM  CMP  Glucose 70 - 99 mg/dL 409  97  811   BUN 6 - 20 mg/dL 16  18  18    Creatinine 0.61 - 1.24 mg/dL 9.14  7.82  9.56   Sodium 135 - 145 mmol/L 139  135  139   Potassium 3.5 - 5.1 mmol/L 4.3  4.1  4.6   Chloride 98 - 111 mmol/L 101  98  102   CO2 22 - 32 mmol/L 33  27  27   Calcium 8.9 - 10.3 mg/dL 9.4  8.8  9.5   Total Protein 6.5 - 8.1 g/dL 7.2   6.9   Total Bilirubin 0.0 - 1.2 mg/dL 0.9   0.9   Alkaline Phos 38 - 126 U/L 57   72   AST 15 - 41 U/L 27   16   ALT 0 - 44 U/L 28   19     BLOOD FILM: None today.   RADIOGRAPHIC STUDIES: I have personally reviewed the radiological images as listed and agreed with the findings in the report: no evidence of lymphadenopathy or splenomegaly.   No results found.  ASSESSMENT & PLAN Terry [name] 53 y.o. male with medical history significant for CLL Rai Stage 0 presents for  a follow up visit. The patient's last visit was on 07/25/2019 at which time he established care in our clinic.  After review of the imaging and the lab work the patient's findings are most consistent with CLL.  Given that he only has a lymphocytosis this would be considered a CLL Rai stage 0.  There is no clear indication for treatment at this time and I would recommend that he enter into active surveillance with close clinical monitoring and strict return precautions.  Today Terry [name] notes he has been well in the interim since his last visit. He was recently diagnosed with atrial fibrillation and started on Eliquis, but has been in his normal state of health otherwise. He is asymptomatic with no unintentional weight loss, fevers, chills, sweats, or new lymphadenopathy.   Previously we discussed the nature of CLL, the relatively good prognosis, the meaning of his a 17p13 deletion, and possible treatments moving forward.  We also discussed that this tends to be a more indolent cancer and that close monitoring which is appropriate at this time.  We also discussed the criteria for which we would want to start treatment including bulky lymphadenopathy, anemia, or thrombocytopenia.  Terry [name] and his wife expressed their understanding of the disease and the plan moving forward.  In the event treatment is required could consider therapy with ibrutinib +/- rituximab, Acalabrutinib +/- obinutuzumab, or Venetoclax +obinutuzumab. Of these treatment options only Venetoclax/obinutuzumab would be of limited duration (12 months). We  can discuss these treatments in detail when the time comes.   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 Will continue to monitor in clinic with serial visits and blood work to assess for progression of the disease.  -- Today labs show white blood cell count 14.4, hemoglobin 15.7, MCV 85.7, platelets 279 --plan for labs q6  months with CBC, CMP, LDH --Plan to have patient return in 12 months with PRN visits if new symptoms were to develop.   #Atrial Fibrillation --currently on eliquis therapy 5mg  BID  No orders of the defined types were placed in this encounter.   All questions were answered. The patient knows to call the clinic with any problems, questions or concerns.  A total of more than 30 minutes were spent on this encounter and over half of that time was spent on counseling and coordination of care as outlined above.   Ulysees Barns, MD Department of Hematology/Oncology [facility] Phone: [phone] Pager: [phone] Email: [email or web address]  09/30/2023 4:26 PM

## 2023-10-07 ENCOUNTER — Ambulatory Visit: Payer: BC Managed Care – PPO | Admitting: Family Medicine

## 2023-10-07 ENCOUNTER — Encounter: Payer: Self-pay | Admitting: Family Medicine

## 2023-10-07 VITALS — BP 122/84 | HR 89 | Temp 97.7°F | Ht 69.0 in | Wt 217.4 lb

## 2023-10-07 DIAGNOSIS — Z7984 Long term (current) use of oral hypoglycemic drugs: Secondary | ICD-10-CM

## 2023-10-07 DIAGNOSIS — E291 Testicular hypofunction: Secondary | ICD-10-CM | POA: Diagnosis not present

## 2023-10-07 DIAGNOSIS — E1165 Type 2 diabetes mellitus with hyperglycemia: Secondary | ICD-10-CM

## 2023-10-07 DIAGNOSIS — Z7985 Long-term (current) use of injectable non-insulin antidiabetic drugs: Secondary | ICD-10-CM

## 2023-10-07 DIAGNOSIS — I10 Essential (primary) hypertension: Secondary | ICD-10-CM | POA: Diagnosis not present

## 2023-10-07 LAB — POCT GLYCOSYLATED HEMOGLOBIN (HGB A1C): Hemoglobin A1C: 6.2 % — AB (ref 4.0–5.6)

## 2023-10-07 MED ORDER — METFORMIN HCL ER 500 MG PO TB24
500.0000 mg | ORAL_TABLET | Freq: Every day | ORAL | 1 refills | Status: DC
Start: 2023-10-07 — End: 2024-04-19

## 2023-10-07 MED ORDER — SEMAGLUTIDE (1 MG/DOSE) 4 MG/3ML ~~LOC~~ SOPN
1.0000 mg | PEN_INJECTOR | SUBCUTANEOUS | 3 refills | Status: DC
Start: 2023-10-07 — End: 2024-03-27

## 2023-10-07 MED ORDER — LISINOPRIL 10 MG PO TABS: 10.0000 mg | ORAL_TABLET | Freq: Every day | ORAL | 1 refills | Status: DC

## 2023-10-07 NOTE — Progress Notes (Signed)
 Established Patient Office Visit  Subjective   Patient ID: Terry [name], male    DOB: 30-Dec-1970  Age: 53 y.o. MRN: [id number]  Chief Complaint  Patient presents with   Medical Management of Chronic Issues    Pt is here for follow up today for his testosterone refills. He reports some chronic fatigue today but otherwise is in his normal state of health.   DM-- continues on metformin and ozempic, A1C is well controlled today, reviewed his health maintenance, he is UTD except for the eye exam, reminded patient to schedule this. He denies any vision issues currently.   Low T-- pt is no longer on methadone therapy, he reports he remains sober, states that the dose of tesosterone is still working well for his symptoms, needs new level drawn.  HTN -- BP in office performed and is well controlled. He  reports no side effects to the medications, no chest pain, SOB, dizziness or headaches. He has a BP cuff at home and is checking BP regularly, reports they are in the normal range.        Current Outpatient Medications  Medication Instructions   albuterol (VENTOLIN HFA) 108 (90 Base) MCG/ACT inhaler INHALE 1 TO 2 PUFFS INTO THE LUNGS EVERY 6 HOURS AS NEEDED FOR WHEEZING OR SHORTNESS OF BREATH   blood glucose meter kit and supplies KIT Dispense based on patient and insurance preference. Use up to four times daily as directed.   Blood Pressure Monitoring (COMFORT TOUCH BP CUFF/LARGE) MISC 1 each, Does not apply, Daily PRN   cyclobenzaprine (FLEXERIL) 10 mg, Oral, Daily at bedtime   lisinopril (ZESTRIL) 10 mg, Oral, Daily   Magnesium 400 mg, Oral, Daily   metFORMIN (GLUCOPHAGE-XR) 500 mg, Oral, Daily with breakfast   multivitamin-iron-minerals-folic acid (CENTRUM) chewable tablet 1 tablet, Daily   Polyethylene Glycol 3350 (MIRALAX PO) 238 g   rivaroxaban (XARELTO) 20 mg, Oral, Daily with supper   rosuvastatin (CRESTOR) 10 mg, Oral, Daily   Semaglutide (1 MG/DOSE) 1 mg, Injection,  Weekly   sildenafil (VIAGRA) 100 MG tablet TAKE 1/2 TABLET(50 MG) BY MOUTH DAILY AS NEEDED FOR ERECTILE DYSFUNCTION   Spacer/Aero-Holding Chambers (E-Z SPACER) inhaler Use as instructed   Spacer/Aero-Holding Chambers DEVI 1 Device, Does not apply, Daily PRN   tamsulosin (FLOMAX) 0.4 MG CAPS capsule TAKE 1 CAPSULE(0.4 MG) BY MOUTH DAILY   Testosterone Enanthate (XYOSTED) 100 MG/0.5ML SOAJ INJECT 0.5 ML( 100 MG TOTAL) UNDER THE SKIN ONCE A WEEK   valACYclovir (VALTREX) 1000 MG tablet Take 2 tablets PO BID x 1 day at onset of symptoms.    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   Paroxysmal atrial fibrillation (HCC) 12/15/2021   Class 2 drug-induced obesity with serious comorbidity and body mass index (BMI) of 36.0 to 36.9 in adult 12/15/2021   Type 2 diabetes mellitus without complication, without long-term current use of insulin (HCC) 12/15/2021   At risk for central sleep apnea 12/15/2021   Non-restorative sleep 12/15/2021   Diabetes mellitus type II, controlled (HCC) 12/02/2021   CLL (chronic lymphocytic leukemia) (HCC) 12/06/2019   Opioid dependence (HCC) 07/12/2019   Restless leg syndrome 07/03/2019   Chronic insomnia 07/03/2019   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     Objective:     BP 122/84   Pulse 89   Temp 97.7 F (36.5 C) (Oral)   Ht 5\' 9"  (1.753 m)   Wt 217 lb 6.4 oz (98.6 kg)   SpO2 96%   BMI 32.10 kg/m    Physical Exam Vitals reviewed.  Constitutional:      Appearance: Normal  appearance. He is well-groomed. He is obese.  Eyes:     Extraocular Movements: Extraocular movements intact.     Conjunctiva/sclera: Conjunctivae normal.  Cardiovascular:     Rate and Rhythm: Normal rate and regular rhythm.     Heart sounds: S1 normal and S2 normal. No murmur heard. Pulmonary:     Effort: Pulmonary effort is normal.     Breath sounds: Normal breath sounds and air entry. No rales.  Musculoskeletal:     Right lower leg: No edema.     Left lower leg: No edema.  Neurological:     General: No focal deficit present.     Mental Status: He is alert and oriented to person, place, and time.     Gait: Gait is intact.  Psychiatric:        Mood and Affect: Mood and affect normal.      Results for orders placed or performed in visit on 10/07/23  POC HgB A1c  Result Value Ref Range   Hemoglobin A1C 6.2 (A) 4.0 - 5.6 %   HbA1c POC (<> result, manual entry)     HbA1c, POC (prediabetic range)     HbA1c, POC (controlled diabetic range)      Last metabolic panel Lab Results  Component Value Date   GLUCOSE 119 (H) 09/30/2023   NA 139 09/30/2023   K 4.3 09/30/2023   CL 101 09/30/2023   CO2 33 (H) 09/30/2023   BUN 16 09/30/2023   CREATININE 1.19 09/30/2023   GFRNONAA >60 09/30/2023   CALCIUM 9.4 09/30/2023   PHOS 1.5 (L) 09/25/2017   PROT 7.2 09/30/2023   ALBUMIN 4.5 09/30/2023   LABGLOB 2.4 03/31/2023   BILITOT 0.9 09/30/2023   ALKPHOS 57 09/30/2023   AST 27 09/30/2023   ALT 28 09/30/2023   ANIONGAP 5 09/30/2023      The ASCVD Risk score (Arnett DK, et al., 2019) failed to calculate for the following reasons:   The valid total cholesterol range is 130 to 320 mg/dL    Assessment & Plan:  Controlled type 2 diabetes mellitus with hyperglycemia, without long-term current use of insulin (HCC) Assessment & Plan: A1C is well controlled on the semaglutide and metformin, continue as prescribed  Orders: -     POCT glycosylated hemoglobin (Hb A1C) -     COLLECTION  CAPILLARY BLOOD SPECIMEN -     Semaglutide (1 MG/DOSE); Inject 1 mg as directed once a week.  Dispense: 9 mL; Refill: 3 -     metFORMIN HCl ER; Take 1 tablet (500 mg total) by mouth daily with breakfast.  Dispense: 90 tablet; Refill: 1  Primary hypertension Assessment & Plan: BP is normal today, chronic and stable, continue lisinopril 10 mg daily  Orders: -     Lisinopril; Take 1 tablet (10 mg total) by mouth daily.  Dispense: 90 tablet; Refill: 1  Testicular hypofunction Assessment & Plan: Doing well on  the current 100 mg dose every week, will check new testosterone level today   Orders: -     Testosterone; Future     Return in about 4 months (around 02/06/2024) for annual physical exam -- come fasting for blood work-- cholesterol testing.    Karie Georges, MD

## 2023-10-07 NOTE — Patient Instructions (Addendum)
 I recommend to go back to [facility] to follow up on the sleep study and the sleep disturbances you have been experiencing. Ask about getting and EMG for the left neck pain to rule out a pinched nerve.   [phone]

## 2023-10-12 NOTE — Assessment & Plan Note (Signed)
 Doing well on the current 100 mg dose every week, will check new testosterone level today

## 2023-10-12 NOTE — Assessment & Plan Note (Signed)
A1C is well controlled on the semaglutide and metformin, continue as prescribed

## 2023-10-12 NOTE — Assessment & Plan Note (Signed)
 BP is normal today, chronic and stable, continue lisinopril 10 mg daily

## 2023-10-14 ENCOUNTER — Other Ambulatory Visit

## 2023-10-17 ENCOUNTER — Other Ambulatory Visit

## 2023-10-21 ENCOUNTER — Other Ambulatory Visit (INDEPENDENT_AMBULATORY_CARE_PROVIDER_SITE_OTHER)

## 2023-10-21 DIAGNOSIS — E291 Testicular hypofunction: Secondary | ICD-10-CM | POA: Diagnosis not present

## 2023-10-21 LAB — TESTOSTERONE: Testosterone: 820.91 ng/dL (ref 300.00–890.00)

## 2023-10-24 ENCOUNTER — Telehealth: Payer: Self-pay | Admitting: Neurology

## 2023-10-24 ENCOUNTER — Encounter: Payer: Self-pay | Admitting: Family Medicine

## 2023-10-24 NOTE — Telephone Encounter (Signed)
 If patient calls back please schedule him for an office visit with a NP or Dr. For an sleep consults, Thank.

## 2023-10-24 NOTE — Telephone Encounter (Signed)
 Called the patient and LVM to give [place] a call back.

## 2023-10-24 NOTE — Telephone Encounter (Signed)
 Patient left a voicemail on our phone stating that he wanted to r/s his CPAP SS appt, but since its been longer than a year he would need a follow up.

## 2023-11-08 ENCOUNTER — Telehealth: Payer: Self-pay | Admitting: Neurology

## 2023-11-08 NOTE — Telephone Encounter (Signed)
 Pt is asking to be called to schedule the 2nd sleep study he reports he was supposed to have about 13 months ago

## 2023-11-20 ENCOUNTER — Other Ambulatory Visit: Payer: Self-pay | Admitting: Family Medicine

## 2023-11-21 ENCOUNTER — Telehealth: Payer: Self-pay | Admitting: Neurology

## 2023-11-21 NOTE — Telephone Encounter (Signed)
Appointment made for a follow up

## 2023-12-06 ENCOUNTER — Other Ambulatory Visit (HOSPITAL_COMMUNITY): Payer: Self-pay

## 2023-12-08 ENCOUNTER — Ambulatory Visit: Admitting: Neurology

## 2023-12-08 ENCOUNTER — Encounter: Payer: Self-pay | Admitting: Neurology

## 2023-12-08 VITALS — BP 134/84 | HR 87 | Ht 69.0 in | Wt 223.0 lb

## 2023-12-08 DIAGNOSIS — G4726 Circadian rhythm sleep disorder, shift work type: Secondary | ICD-10-CM | POA: Diagnosis not present

## 2023-12-08 DIAGNOSIS — C9110 Chronic lymphocytic leukemia of B-cell type not having achieved remission: Secondary | ICD-10-CM

## 2023-12-08 DIAGNOSIS — E119 Type 2 diabetes mellitus without complications: Secondary | ICD-10-CM | POA: Diagnosis not present

## 2023-12-08 DIAGNOSIS — G478 Other sleep disorders: Secondary | ICD-10-CM

## 2023-12-08 DIAGNOSIS — I480 Paroxysmal atrial fibrillation: Secondary | ICD-10-CM | POA: Diagnosis not present

## 2023-12-08 DIAGNOSIS — Z9189 Other specified personal risk factors, not elsewhere classified: Secondary | ICD-10-CM

## 2023-12-08 DIAGNOSIS — Z7984 Long term (current) use of oral hypoglycemic drugs: Secondary | ICD-10-CM

## 2023-12-08 MED ORDER — MODAFINIL 200 MG PO TABS: 100.0000 mg | ORAL_TABLET | Freq: Every day | ORAL | 5 refills | Status: AC

## 2023-12-08 NOTE — Progress Notes (Signed)
 Provider:  Neomia Banner, MD  Primary Care Physician:  Terry House, MD [address] [place] [address]     Referring Provider: Aida House, Md [address] [place],  [place] [address]          Chief Complaint according to patient   Patient presents with:                HISTORY OF PRESENT ILLNESS:  Terry [name] is a 53 y.o. male patient who is here for revisit after a 2 year hiatus on 12/08/2023 to finally undergo CPAP treatment. His baseline sleep study had shown complex apnea and he was invited to an in-lab study to follow .  Chief concern according to patient : " I  have CLL and atrial fib and need a new sleep study".   He still works shifts, switching rotation every 10 weeks from night to days.  Operates a Chief Executive Officer. No longer sleeping in a recliner.  The patient underwent an in-lab baseline study which was dated 02-25-2022 during which a loud snoring was recorded and mild but complex sleep apnea with an AHI of 11.3 was noted. The 02  nadir in his sleep study was 85% and hypoxemia equal to 15 minutes, there were no periodic limb movements, he did about 13% REM sleep the sleep efficiency was 90% at the time yet endorsed the Epworth Sleepiness Scale at 18 out of 24 points the fatigue severity score at 45 out of 63 points and his BMI was 36.  His neck circumference 18 inches. He did have mostly mixed apneas and I do wonder if he has a higher central apnea component now.  A CPAP follow-up was scheduled three times but cancelled by the patient .      When seen last : Terry [name] is a 52 -year- old  male patient with caucasian mother and Terry [name] father seen here upon referral on 12/15/2021 from Dr. Koberlein, MD.    Chief concern according to patient :  poorer sleep quality, un-refreshing sleep, wife has witnessed apnea.symptoms of snoring, a trial fib, morbid obesity and HTN. GERD. CLL dx in 2020.  Working 12 hour rotatory  shifts, every 6 weeks changing from 7p-7am to 7a-7pm.  Tries to get 8hrs of sleep but normally gets 5-6 hrs when working. Following a changing shift schedule the coming weeks are hard-  Weekends are better , more sleep . Sleeps in a recliner for the last 4-5 years. "       Review of Systems: Out of a complete 14 system review, the patient complains of only the following symptoms, and all other reviewed systems are negative.:  How likely are you to doze in the following situations: 0 = not likely, 1 = slight chance, 2 = moderate chance, 3 = high chance  Sitting and Reading? Watching Television? Sitting inactive in a public place (theater or meeting)? Lying down in the afternoon when circumstances permit? Sitting and talking to someone? Sitting quietly after lunch without alcohol? In a car, while stopped for a few minutes in traffic? As a passenger in a car for an hour without a break?  Total = 19/ 24  FSS at 53/ 63 points    Social History   Socioeconomic History   Marital status: Married    Spouse name: Terry [name]   Number of children: 3   Years of education: 9th grade , moved to [facility]  at age 57 .    Highest education level: 9th grade, no GED   Occupational History   Not on file   forklift driver   Tobacco Use   Smoking status: Never   Smokeless tobacco: Never  Vaping Use   Vaping status: Never Used  Substance and Sexual Activity   Alcohol use: Yes   Drug use: Yes opiate pain medication abuse.     Types: Marijuana   Sexual activity: Yes  Other Topics Concern   Not on file  Social History Narrative   Lives at home with wife and children   R handed   Caffeine: 7 cans of diet pepsi a day.    Social Drivers of Corporate investment banker Strain: Not on file  Food Insecurity: Not on file  Transportation Needs: Not on file  Physical Activity: Not on file  Stress: Not on file  Social Connections: Not on file    Family History  Problem Relation Age of Onset    Diabetes Mother    AAA (abdominal aortic aneurysm) Mother 38   Blindness Mother    Diabetes Father    Stroke Father 45   Heart attack Father    Drug abuse Sister    Drug abuse Brother    Brain cancer Maternal Grandmother 56   Diabetes Paternal Grandmother    Stroke Paternal Grandmother    Diabetes Paternal Grandfather 65   Colon cancer Neg Hx    Colon polyps Neg Hx    Esophageal cancer Neg Hx    Stomach cancer Neg Hx    Rectal cancer Neg Hx     Past Medical History:  Diagnosis Date   Asthma    Cancer (HCC)    Diabetes mellitus without complication (HCC)    GERD (gastroesophageal reflux disease)    Hyperlipidemia    Hypertension    Opioid abuse (HCC)    following with methadone  clinic   Pneumonia     Past Surgical History:  Procedure Laterality Date   right bicep Right 2013   TRICEPS TENDON REPAIR Left 04/2013     Current Outpatient Medications on File Prior to Visit  Medication Sig Dispense Refill   albuterol  (VENTOLIN  HFA) 108 (90 Base) MCG/ACT inhaler INHALE 1 TO 2 PUFFS INTO THE LUNGS EVERY 6 HOURS AS NEEDED FOR WHEEZING OR SHORTNESS OF BREATH 20.1 g 1   blood glucose meter kit and supplies KIT Dispense based on patient and insurance preference. Use up to four times daily as directed. 1 each 0   Blood Pressure Monitoring (COMFORT TOUCH BP CUFF/LARGE) MISC 1 each by Does not apply route daily as needed. 1 each 0   cyclobenzaprine  (FLEXERIL ) 10 MG tablet Take 1 tablet (10 mg total) by mouth at bedtime. 30 tablet 2   lisinopril  (ZESTRIL ) 10 MG tablet Take 1 tablet (10 mg total) by mouth daily. 90 tablet 1   Magnesium  400 MG TABS Take 400 mg by mouth daily.     metFORMIN  (GLUCOPHAGE -XR) 500 MG 24 hr tablet Take 1 tablet (500 mg total) by mouth daily with breakfast. 90 tablet 1   multivitamin-iron-minerals-folic acid  (CENTRUM) chewable tablet Chew 1 tablet by mouth daily.     Polyethylene Glycol 3350 (MIRALAX PO) Take 238 g by mouth.     rivaroxaban  (XARELTO ) 20 MG TABS  tablet Take 1 tablet (20 mg total) by mouth daily with supper. 90 tablet 3   rosuvastatin  (CRESTOR ) 10 MG tablet Take 1 tablet (10 mg total) by mouth  daily. 30 tablet 5   Semaglutide , 1 MG/DOSE, 4 MG/3ML SOPN Inject 1 mg as directed once a week. 9 mL 3   sildenafil  (VIAGRA ) 100 MG tablet TAKE 1/2 TABLET(50 MG) BY MOUTH DAILY AS NEEDED FOR ERECTILE DYSFUNCTION 30 tablet 0   Spacer/Aero-Holding Chambers (E-Z SPACER) inhaler Use as instructed 1 each 2   Spacer/Aero-Holding Chambers DEVI 1 Device by Does not apply route daily as needed. 1 each 0   tamsulosin  (FLOMAX ) 0.4 MG CAPS capsule TAKE 1 CAPSULE(0.4 MG) BY MOUTH DAILY 90 capsule 0   Testosterone  Enanthate (XYOSTED ) 100 MG/0.5ML SOAJ INJECT 0.5 ML( 100 MG TOTAL) UNDER THE SKIN ONCE A WEEK 2 mL 2   valACYclovir  (VALTREX ) 1000 MG tablet Take 2 tablets PO BID x 1 day at onset of symptoms. 30 tablet 0   No current facility-administered medications on file prior to visit.    No Known Allergies   DIAGNOSTIC DATA (LABS, IMAGING, TESTING) - I reviewed patient records, labs, notes, testing and imaging myself where available.  Lab Results  Component Value Date   WBC 14.4 (H) 09/30/2023   HGB 15.7 09/30/2023   HCT 47.3 09/30/2023   MCV 85.7 09/30/2023   PLT 279 09/30/2023      Component Value Date/Time   NA 139 09/30/2023 0835   NA 139 03/31/2023 0856   K 4.3 09/30/2023 0835   CL 101 09/30/2023 0835   CO2 33 (H) 09/30/2023 0835   GLUCOSE 119 (H) 09/30/2023 0835   BUN 16 09/30/2023 0835   BUN 18 03/31/2023 0856   CREATININE 1.19 09/30/2023 0835   CREATININE 1.14 06/24/2020 1024   CALCIUM  9.4 09/30/2023 0835   PROT 7.2 09/30/2023 0835   PROT 6.9 03/31/2023 0856   ALBUMIN 4.5 09/30/2023 0835   ALBUMIN 4.5 03/31/2023 0856   AST 27 09/30/2023 0835   ALT 28 09/30/2023 0835   ALKPHOS 57 09/30/2023 0835   BILITOT 0.9 09/30/2023 0835   GFRNONAA >60 09/30/2023 0835   GFRNONAA 77 06/24/2020 1024   GFRAA 89 06/24/2020 1024   Lab Results   Component Value Date   CHOL 122 03/31/2023   HDL 37 (L) 03/31/2023   LDLCALC 69 03/31/2023   LDLDIRECT 132.0 07/11/2019   TRIG 79 03/31/2023   CHOLHDL 3.3 03/31/2023   Lab Results  Component Value Date   HGBA1C 6.2 (A) 10/07/2023   Lab Results  Component Value Date   VITAMINB12 573 05/13/2021   Lab Results  Component Value Date   TSH 3.61 12/02/2021    PHYSICAL EXAM:  Today's Vitals   12/08/23 1034  BP: 134/84  Pulse: 87  Weight: 223 lb (101.2 kg)  Height: 5\' 9"  (1.753 m)   Body mass index is 32.93 kg/m.   Lost weight in the last 2 years,   Wt Readings from Last 3 Encounters:  12/08/23 223 lb (101.2 kg)  10/07/23 217 lb 6.4 oz (98.6 kg)  09/30/23 218 lb 9.6 oz (99.2 kg)     Ht Readings from Last 3 Encounters:  12/08/23 5\' 9"  (1.753 m)  10/07/23 5\' 9"  (1.753 m)  06/15/23 5\' 9"  (1.753 m)      General: The patient is awake, alert - but  appears short of breath.  The patient is groomed. Head: Normocephalic, atraumatic. Neck is supple.  Mallampati 3,  neck circumference:18 inches .  Nasal airflow is not fully  patent.   Dental status:  all biological teeth.  Cardiovascular:  irregular rate and cardiac rhythm by pulse,  without distended neck veins. Respiratory: Lungs are clear to auscultation. Coughing, congestion  Skin: Tattooed,no evidence of ankle edema, nor rash. Trunk: The patient's posture is erect.   Neurologic exam : The patient is awake and alert, oriented to place and time.   Memory subjective described as intact.  Attention span & concentration ability appears normal.  Speech is fluent,  without dysarthria, dysphonia or aphasia.  Mood and affect are appropriate.   Cranial nerves: no loss of smell or taste reported  Pupils are equal and briskly reactive to light. Funduscopic exam deferred. Terry [name]  No Diplopia. Visual fields by finger perimetry are intact.  Facial sensation intact to fine touch.  Facial motor strength is symmetric and tongue and  uvula move midline.  Neck ROM : rotation, tilt and flexion extension were normal for age and shoulder shrug was symmetrical.     Sensory : finger tip numbness and sometimes his left hand is numb when waking up , and can't grip.  Motor exam:  Symmetric bulk, tone and ROM.   Normal tone without cog -wheeling, symmetric grip strength . Deep tendon reflexes: in the upper and lower extremities are symmetric and intact.      ASSESSMENT AND PLAN 53 y.o. year old male  patient of [place] -Caucasian  ancestry here with:    1) untreated sleep apnea and excessive daytime sleepiness -  he has risk factors for Central and for Obstructive Apnea, and I will now order a baseline HST to confirm the findings from 2 years ago, these would not be qualifying him for CPAP prescription now.   2) high degree of fatigue which can be to related to atrial fib  DM and CLL>  and also to history of opiate abuse if related to a mood disorder(?)   Fatigue is often felt after a spell of atrial fibrillation,  No longer on methadone -    Non-restorative sleep in a patient with 12-hour shift work rotating between night and day shifts.  This in itself is a sleep disorder. 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   His EDS/ ESS score would prohibit operating machinery . Modafinil ordered.   3)  formerly severely obese, nor BMI 32. Ozempic .   PLAN : HST ordered, 3 %  scoring by AASM, and to be followed by  auto titration device if no central apneas are present.   I plan to follow up either personally or through our NP within 5 months.   I would like to thank  Terry House, Md [address] [place],  [place] [address] for allowing me to meet with and to take care of this pleasant patient.   After spending a total time of  45  minutes face to face and additional time for physical and neurologic examination, review of laboratory studies,  personal review of  imaging studies, reports and results of other testing and review of referral information / records as far as provided in visit,   Electronically signed by: Terry Banner, MD 12/08/2023 10:49 AM  [facility] Board certified by The [employer] of Sleep Medicine and Diplomate of the [employer] of Sleep Medicine. Board certified In Neurology through the ABPN, Fellow of the [employer] of Neu[employer].

## 2023-12-08 NOTE — Patient Instructions (Signed)
 Hypersomnia Hypersomnia is a condition in which a person feels very tired during the day even though the person gets plenty of sleep at night. A person with this condition may take naps during the day and may find it very difficult to wake up from sleep. Hypersomnia may affect a person's ability to think, concentrate, drive, or remember things. What are the causes? The cause of this condition may not be known. Possible causes include: Taking certain medicines. Using drugs or alcohol. Sleep disorders, such as narcolepsy and sleep apnea. Injury to the head, brain, or spinal cord. Tumors. Certain medical conditions. These include: Depression. Diabetes. Gastroesophageal reflux disease (GERD). An underactive thyroid  gland (hypothyroidism). What are the signs or symptoms? The main symptoms of hypersomnia include: Feeling very tired throughout the day, regardless of how much sleep you got the night before. Having trouble waking up. Others may find it difficult to wake you up when you are sleeping. Sleeping for longer and longer periods at a time. Taking naps throughout the day. Other symptoms may include: Feeling restless, anxious, or annoyed. Lacking energy. Having trouble with: Remembering. Speaking. Thinking. Loss of appetite. Seeing, hearing, tasting, smelling, or feeling things that are not real (hallucinations). How is this diagnosed? This condition may be diagnosed based on: Your symptoms and medical history. Your sleeping habits. Your health care provider may ask you to write down your sleeping habits in a daily sleep log, along with any symptoms you have. A series of tests that are done while you sleep (sleep study or polysomnogram). A test that measures how quickly you can fall asleep during the day (daytime nap study or multiple sleep latency test). How is this treated? This condition may be treated by: Following a regular sleep routine. Making lifestyle changes,  such as changing your eating habits, getting regular exercise, and avoiding alcohol or caffeinated beverages. Taking medicines to make you more alert (stimulants) during the day. Treating any underlying medical causes of hypersomnia. Follow these instructions at home: Sleep habits Stick to a routine that includes going to bed and waking up at the same times every day and night. Practice a relaxing bedtime routine. This may include reading, meditation, deep breathing, or taking a warm bath before going to sleep. Exercise regularly as told by your health care provider. However, avoid exercising in the hours right before bedtime. Keep your sleep environment at a cooler temperature, darkened, and quiet. Sleep with pillows and a mattress that are comfortable and supportive. Schedule short 20-minute naps for when you feel sleepiest during the day. Talk with your employer or teachers about your hypersomnia. If possible, adjust your schedule so that: You have a regular daytime work schedule. You can take a scheduled nap during the day. You do not have to work or be active at night. Do not eat a heavy meal for a few hours before bedtime. Eat your meals at about the same times every day. Safety  Do not drive or use machinery if you are sleepy. Ask your health care provider if it is safe for you to drive. Wear a life jacket when swimming or spending time near water. General instructions  Take over-the-counter and prescription medicines only as told by your health care provider. This includes supplements. Avoid drinking alcohol or caffeinated beverages. Keep a sleep log that will help your health care provider manage your condition. This may include information about: What time you go to bed each night. How often you wake up at  night. How many hours you sleep at night. How often and for how long you nap during the day. Any observations from others, such as leg movements during sleep, sleep walking,  or snoring. Keep all follow-up visits. This is important. Contact a health care provider if: You have new symptoms. Your symptoms get worse. Get help right away if: You have thoughts about hurting yourself or someone else. Get help right away if you feel like you may hurt yourself or others, or have thoughts about taking your own life. Go to your nearest emergency room or: Call 911. Call the National Suicide Prevention Lifeline at 854-079-4475 or 988. This is open 24 hours a day. Text the Crisis Text Line at 303-027-2871. Summary Hypersomnia refers to a condition in which you feel very tired during the day even though you get plenty of sleep at night. A person with this condition may take naps during the day and may find it very difficult to wake up from sleep. Hypersomnia may affect a person's ability to think, concentrate, drive, or remember things. Treatment may include a regular sleep routine and making some lifestyle changes. This information is not intended to replace advice given to you by your health care provider. Make sure you discuss any questions you have with your health care provider. Document Revised: 06/29/2021 Document Reviewed: 06/29/2021 Elsevier Patient Education  2024 Elsevier Inc.Modafinil Tablets What is this medication? MODAFINIL (moe DAF i nil) treats sleep disorders, such as narcolepsy, obstructive sleep apnea, and shift work disorder. It works by promoting wakefulness. It belongs to a group of medications called stimulants. This medicine may be used for other purposes; ask your health care provider or pharmacist if you have questions. COMMON BRAND NAME(S): Provigil What should I tell my care team before I take this medication? They need to know if you have any of these conditions: Kidney disease Liver disease Mental health conditions An unusual or allergic reaction to modafinil, other medications, foods, dyes, or preservatives Pregnant or trying to get  pregnant Breast-feeding How should I use this medication? Take this medication by mouth with water. Take it as directed on the prescription label at the same time every day. You can take it with or without food. If it upsets your stomach, take it with food. Keep taking it unless your care team tells you to stop. A special MedGuide will be given to you by the pharmacist with each prescription and refill. Be sure to read this information carefully each time. Talk to your care team about the use of this medication in children. Special care may be needed. Overdosage: If you think you have taken too much of this medicine contact a poison control center or emergency room at once. NOTE: This medicine is only for you. Do not share this medicine with others. What if I miss a dose? If you miss a dose, take it as soon as you can. If it is almost time for your next dose, take only that dose. Do not take double or extra doses. What may interact with this medication? Do not take this medication with any of the following: Amphetamine or dextroamphetamine Dexmethylphenidate or methylphenidate MAOIs, such as Marplan, Nardil, and Parnate Pemoline Procarbazine This medication may also interact with the following: Antifungal medications, such as itraconazole or ketoconazole  Barbiturates, such as phenobarbital Carbamazepine Cyclosporine Diazepam Estrogen or progestin hormones Medications for mental health conditions Phenytoin Propranolol Triazolam Warfarin This list may not describe all possible interactions. Give your health care provider a  list of all the medicines, herbs, non-prescription drugs, or dietary supplements you use. Also tell them if you smoke, drink alcohol, or use illegal drugs. Some items may interact with your medicine. What should I watch for while using this medication? Visit your care team for regular checks on your progress. It may be some time before you see the benefit from this  medication. This medication may affect your coordination, reaction time, or judgment. Do not drive or operate machinery until you know how this medication affects you. Sit up or stand slowly to reduce the risk of dizzy or fainting spells. Drinking alcohol with this medication can increase the risk of these side effects. This medication may cause serious skin reactions. They can happen weeks to months after starting the medication. Contact your care team right away if you notice fevers or flu-like symptoms with a rash. The rash may be red or purple and then turn into blisters or peeling of the skin. You may also notice a red rash with swelling of the face, lips, or lymph nodes in your neck or under your arms. Estrogen and progestin hormones may not work as well while you are taking this medication. Your care team can help you find the contraceptive option that works for you. It is unknown if the effects of this medication will be increased by the use of caffeine. Caffeine is found in many foods, beverages, and medications. Ask your care team if you should limit or change your intake of caffeine-containing products while on this medication. What side effects may I notice from receiving this medication? Side effects that you should report to your care team as soon as possible: Allergic reactions or angioedema--skin rash, itching or hives, swelling of the face, eyes, lips, tongue, arms, or legs, trouble swallowing or breathing Increase in blood pressure Mood and behavior changes--anxiety, nervousness, confusion, hallucinations, irritability, hostility, thoughts of suicide or self-harm, worsening mood, feelings of depression Rash, fever, and swollen lymph nodes Redness, blistering, peeling, or loosening of the skin, including inside the mouth Side effects that usually do not require medical attention (report to your care team if they continue or are bothersome): Anxiety,  nervousness Dizziness Headache Nausea Trouble sleeping This list may not describe all possible side effects. Call your doctor for medical advice about side effects. You may report side effects to FDA at 1-800-FDA-1088. Where should I keep my medication? Keep out of the reach of children and pets. This medication can be abused. Keep it in a safe place to protect it from theft. Do not share it with anyone. It is only for you. Selling or giving away this medication is dangerous and against the law. Store at room temperature between 20 and 25 degrees C (68 and 77 degrees F). Get rid of any unused medication after the expiration date. This medication may cause harm and death if it is taken by other adults, children, or pets. It is important to get rid of the medication as soon as you no longer need it or it is expired. You can do this in two ways: Take the medication to a medication take-back program. Check with your pharmacy or law enforcement to find a location. If you cannot return the medication, check the label or package insert to see if the medication should be thrown out in the garbage or flushed down the toilet. If you are not sure, ask your care team. If it is safe to put it in the trash, take the medication out  of the container. Mix the medication with cat litter, dirt, coffee grounds, or other unwanted substance. Seal the mixture in a bag or container. Put it in the trash. NOTE: This sheet is a summary. It may not cover all possible information. If you have questions about this medicine, talk to your doctor, pharmacist, or health care provider.  2024 Elsevier/Gold Standard (2022-08-27 00:00:00)

## 2023-12-09 ENCOUNTER — Encounter (HOSPITAL_COMMUNITY): Payer: Self-pay

## 2023-12-09 ENCOUNTER — Ambulatory Visit: Payer: Self-pay

## 2023-12-09 ENCOUNTER — Ambulatory Visit (HOSPITAL_COMMUNITY): Admission: EM | Admit: 2023-12-09 | Discharge: 2023-12-09 | Disposition: A | Discharge status: Home / Self Care

## 2023-12-09 DIAGNOSIS — R200 Anesthesia of skin: Secondary | ICD-10-CM

## 2023-12-09 MED ORDER — IBUPROFEN 800 MG PO TABS
800.0000 mg | ORAL_TABLET | Freq: Three times a day (TID) | ORAL | 0 refills | Status: DC
Start: 2023-12-09 — End: 2024-03-27

## 2023-12-09 NOTE — Telephone Encounter (Signed)
 Copied from CRM [id number]. Topic: Clinical - Red Word Triage >> Dec 09, 2023  2:55 PM Kita Perish H wrote: [facility] that prompted transfer to Nurse Triage: Numbness in left hand and moves up arm a little for a couple days can't make a fist, has a growth or boil on stomach   Chief Complaint: Arm numbness  Symptoms: Left hand and arm numbness and tingling  Frequency: Constant  Disposition: [] ED /[x] Urgent Care (no appt availability in office) / [] Appointment(In office/virtual)/ []  [place] [facility]/ [] Home Care/ [] Refused Recommended Disposition /[] [place] [facility]/ []  Follow-up with PCP Additional Notes: Patient reports that 2-3 days ago he began to experience numbness and tingling of his left hand while at work. He states the tingling is spreading up his left arm and is causing him difficulty with his grip. He states he also has a lump on his abdomen that he noticed about 4 days ago that has doubled in size since that time. Patient was advised that there are no appointments available until next week and was advised to go to urgent care or the ED for evaluation of his symptoms. He verbalized understanding of this plan.    Reason for Disposition  [1] Tingling (e.g., pins and needles) of the face, arm / hand, or leg / foot on one side of the body AND [2] present now (Exceptions: Chronic or recurrent symptom lasting > 4 weeks; or tingling from known cause, such as: bumped elbow, carpal tunnel syndrome, pinched nerve, frostbite.)  Answer Assessment - Initial Assessment Questions 1. SYMPTOM: "What is the main symptom you are concerned about?" (e.g., weakness, numbness)     Numbness and tingling to left hand/arm 2. ONSET: "When did this start?" (minutes, hours, days; while sleeping)     2-3 days ago  3. LAST NORMAL: "When was the last time you (the patient) were normal (no symptoms)?"     2-3 days ago  4. PATTERN "Does this come and go, or has it been constant since it started?"  "Is it present  now?"     Constant  5. CARDIAC SYMPTOMS: "Have you had any of the following symptoms: chest pain, difficulty breathing, palpitations?"     No 6. NEUROLOGIC SYMPTOMS: "Have you had any of the following symptoms: headache, dizziness, vision loss, double vision, changes in speech, unsteady on your feet?"     No 7. OTHER SYMPTOMS: "Do you have any other symptoms?"     Growth on abdomen  Protocols used: Neurologic Deficit-A-AH

## 2023-12-09 NOTE — ED Triage Notes (Signed)
 Patient reports that he began having left arm and hand numbness 2 days ago. Patient states he has problems making a fist. Patient stated he waited to make sure he was not having a stroke.

## 2023-12-09 NOTE — Discharge Instructions (Signed)
  1. Numbness of left hand (Primary) - AMB referral to sports medicine for follow-up evaluation of left hand numbness possibly secondary to carpal tunnel syndrome. - ibuprofen (ADVIL) 800 MG tablet; Take 1 tablet (800 mg total) by mouth 3 (three) times daily.  Dispense: 21 tablet; Refill: 0 - Apply Wrist brace in UC for immobilization and protection of wrist until follow-up with orthopedic sports medicine. -Continue to monitor symptoms for any change in severity if there is any escalation of current symptoms or development of new symptoms follow-up in ER for further evaluation and management.

## 2023-12-09 NOTE — ED Provider Notes (Signed)
 [facility]  Note:  This document was prepared using Dragon voice recognition software and may include unintentional dictation errors.  MRN: [id number] DOB: June 14, 1971  Subjective:   Terry [name] is a 53 y.o. male presenting for left hand numbness and tingling over the last 2 nights while at work.  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  Patient reports that he told his wife about his symptoms that she told him he had to come to the doctor to get evaluated.  Patient denies any injury or trauma to the wrist or hand.  Patient denies any past history of wrist ganglion cyst or carpal tunnel syndrome.  Patient has not taken any over-the-counter medication to treat symptoms.  Patient denies any other secondary neurologic symptoms.  Patient does report history of atrial fibrillation and is currently on Xarelto .  No confusion, headache, blurred vision, slurred speech, loss of coordination or facial droop.  No current facility-administered medications for this encounter.  Current Outpatient Medications:    ibuprofen (ADVIL) 800 MG tablet, Take 1 tablet (800 mg total) by mouth 3 (three) times daily., Disp: 21 tablet, Rfl: 0   albuterol  (VENTOLIN  HFA) 108 (90 Base) MCG/ACT inhaler, INHALE 1 TO 2 PUFFS INTO THE LUNGS EVERY 6 HOURS AS NEEDED FOR WHEEZING OR SHORTNESS OF BREATH, Disp: 20.1 g, Rfl: 1   blood glucose meter kit and supplies KIT, Dispense based on patient and insurance preference. Use up to four times daily as directed., Disp: 1 each, Rfl: 0   Blood Pressure Monitoring (COMFORT TOUCH BP CUFF/LARGE) MISC, 1 each by Does not apply route daily as needed., Disp: 1 each, Rfl: 0   cyclobenzaprine  (FLEXERIL ) 10 MG tablet, Take 1 tablet (10 mg total) by mouth at bedtime., Disp: 30 tablet, Rfl: 2    lisinopril  (ZESTRIL ) 10 MG tablet, Take 1 tablet (10 mg total) by mouth daily., Disp: 90 tablet, Rfl: 1   Magnesium  400 MG TABS, Take 400 mg by mouth daily., Disp: , Rfl:    metFORMIN  (GLUCOPHAGE -XR) 500 MG 24 hr tablet, Take 1 tablet (500 mg total) by mouth daily with breakfast., Disp: 90 tablet, Rfl: 1   modafinil  (PROVIGIL ) 200 MG tablet, Take 0.5-1 tablets (100-200 mg total) by mouth daily., Disp: 30 tablet, Rfl: 5   multivitamin-iron-minerals-folic acid  (CENTRUM) chewable tablet, Chew 1 tablet by mouth daily., Disp: , Rfl:    Polyethylene Glycol 3350 (MIRALAX PO), Take 238 g by mouth., Disp: , Rfl:    rivaroxaban  (XARELTO ) 20 MG TABS tablet, Take 1 tablet (20 mg total) by mouth daily with supper., Disp: 90 tablet, Rfl: 3   rosuvastatin  (CRESTOR ) 10 MG tablet, Take 1 tablet (10 mg total) by mouth daily., Disp: 30 tablet, Rfl: 5   Semaglutide , 1 MG/DOSE, 4 MG/3ML SOPN, Inject 1 mg as directed once a week., Disp: 9 mL, Rfl: 3   sildenafil  (VIAGRA ) 100 MG tablet, TAKE 1/2 TABLET(50 MG) BY MOUTH DAILY AS NEEDED FOR ERECTILE DYSFUNCTION, Disp: 30 tablet, Rfl: 0   Spacer/Aero-Holding Chambers (E-Z SPACER) inhaler, Use as instructed, Disp: 1 each, Rfl: 2   Spacer/Aero-Holding Chambers DEVI, 1 Device by Does not apply route daily as needed., Disp: 1 each, Rfl: 0   tamsulosin  (FLOMAX ) 0.4 MG CAPS capsule, TAKE 1 CAPSULE(0.4 MG) BY MOUTH DAILY, Disp: 90 capsule, Rfl: 0  Testosterone  Enanthate (XYOSTED ) 100 MG/0.5ML SOAJ, INJECT 0.5 ML( 100 MG TOTAL) UNDER THE SKIN ONCE A WEEK, Disp: 2 mL, Rfl: 2   valACYclovir  (VALTREX ) 1000 MG tablet, Take 2 tablets PO BID x 1 day at onset of symptoms., Disp: 30 tablet, Rfl: 0   No Known Allergies  Past Medical History:  Diagnosis Date   Asthma    Cancer (HCC)    Diabetes mellitus without complication (HCC)    GERD (gastroesophageal reflux disease)    Hyperlipidemia    Hypertension    Opioid abuse (HCC)    following with methadone  clinic   Pneumonia       Past Surgical History:  Procedure Laterality Date   right bicep Right 2013   TRICEPS TENDON REPAIR Left 04/2013    Family History  Problem Relation Age of Onset   Diabetes Mother    AAA (abdominal aortic aneurysm) Mother 24   Blindness Mother    Diabetes Father    Stroke Father 25   Heart attack Father    Drug abuse Sister    Drug abuse Brother    Brain cancer Maternal Grandmother 58   Diabetes Paternal Grandmother    Stroke Paternal Grandmother    Diabetes Paternal Grandfather 59   Colon cancer Neg Hx    Colon polyps Neg Hx    Esophageal cancer Neg Hx    Stomach cancer Neg Hx    Rectal cancer Neg Hx     Social History   Tobacco Use   Smoking status: Never   Smokeless tobacco: Never  Vaping Use   Vaping status: Never Used  Substance Use Topics   Alcohol use: Not Currently   Drug use: Yes    Types: Marijuana    ROS Refer to HPI for ROS details.  Objective:   Vitals: BP 129/67   Physical Exam Vitals and nursing note reviewed.  Constitutional:      General: He is not in acute distress.    Appearance: Normal appearance. He is not ill-appearing or toxic-appearing.  HENT:     Head: Normocephalic.     Mouth/Throat:     Mouth: Mucous membranes are moist.     Pharynx: Oropharynx is clear.  Eyes:     General:        Right eye: No discharge.        Left eye: No discharge.     Extraocular Movements: Extraocular movements intact.     Conjunctiva/sclera: Conjunctivae normal.     Pupils: Pupils are equal, round, and reactive to light.  Cardiovascular:     Rate and Rhythm: Normal rate.  Pulmonary:     Effort: Pulmonary effort is normal. No respiratory distress.  Musculoskeletal:     Left wrist: Normal.     Left hand: No swelling, deformity, tenderness or bony tenderness. Normal range of motion. Normal strength. Decreased sensation. Normal capillary refill. Normal pulse.  Skin:    General: Skin is warm and dry.     Capillary Refill: Capillary refill takes  less than 2 seconds.  Neurological:     General: No focal deficit present.     Mental Status: He is alert and oriented to person, place, and time.     Cranial Nerves: No cranial nerve deficit.     Sensory: Sensory deficit present.     Motor: No weakness.     Coordination: Coordination normal.     Gait: Gait normal.  Psychiatric:        Mood and Affect:  Mood normal.        Behavior: Behavior normal.        Thought Content: Thought content normal.        Judgment: Judgment normal.     Procedures  No results found for this or any previous visit (from the past 24 hours).  No results found.   Assessment and Plan :     Discharge Instructions       1. Numbness of left hand (Primary) - AMB referral to sports medicine for follow-up evaluation of left hand numbness possibly secondary to carpal tunnel syndrome. - ibuprofen (ADVIL) 800 MG tablet; Take 1 tablet (800 mg total) by mouth 3 (three) times daily.  Dispense: 21 tablet; Refill: 0 - Apply Wrist brace in UC for immobilization and protection of wrist until follow-up with orthopedic sports medicine. -Continue to monitor symptoms for any change in severity if there is any escalation of current symptoms or development of new symptoms follow-up in ER for further evaluation and management.    Delania Ferg B Priyansh Pry   Caitlyn Buchanan, [place], [place] 12/09/23 [phone]

## 2023-12-09 NOTE — ED Notes (Signed)
 Mar Semen, NP notified of the patient's condition.

## 2023-12-11 NOTE — Telephone Encounter (Signed)
 Noted- ok to close.

## 2023-12-27 ENCOUNTER — Ambulatory Visit (INDEPENDENT_AMBULATORY_CARE_PROVIDER_SITE_OTHER): Admitting: Neurology

## 2023-12-27 DIAGNOSIS — G4739 Other sleep apnea: Secondary | ICD-10-CM

## 2023-12-27 DIAGNOSIS — G478 Other sleep disorders: Secondary | ICD-10-CM

## 2023-12-27 DIAGNOSIS — Z9189 Other specified personal risk factors, not elsewhere classified: Secondary | ICD-10-CM

## 2023-12-27 DIAGNOSIS — G4733 Obstructive sleep apnea (adult) (pediatric): Secondary | ICD-10-CM | POA: Diagnosis not present

## 2023-12-27 DIAGNOSIS — G4726 Circadian rhythm sleep disorder, shift work type: Secondary | ICD-10-CM

## 2023-12-27 DIAGNOSIS — E119 Type 2 diabetes mellitus without complications: Secondary | ICD-10-CM

## 2023-12-27 DIAGNOSIS — C9110 Chronic lymphocytic leukemia of B-cell type not having achieved remission: Secondary | ICD-10-CM

## 2023-12-27 DIAGNOSIS — I480 Paroxysmal atrial fibrillation: Secondary | ICD-10-CM

## 2023-12-28 NOTE — Progress Notes (Unsigned)
 Terry [name]

## 2023-12-30 ENCOUNTER — Ambulatory Visit: Payer: Self-pay | Admitting: Neurology

## 2023-12-30 NOTE — Procedures (Signed)
 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 He had been seen in the Sleep Clinic 2 years ago and was tested for apnea, the test returned positive but he had not returned for CPAP treatment until now. His Wife continued to witness apneas. He is still working night shifts.  Epworth Sleepiness Scale endorsed in 2023 at 18 out of 24 points, the fatigue severity score at 45 out of 63 points and his BMI was 36. His neck circumference measured 18 inches.     Epworth sleepiness score:  19/ 24 , FSS at 53/ 63 points    BMI: 32.9 kg/m   Neck Circumference: 18"   FINDINGS:   Sleep Summary:   Total Recording Time (hours, min):   7 h 15 m     Total Sleep Time (hours, min):    6 h and 17 m             Percent REM (%):    10.1%                                    Respiratory Indices: (rounded)   Calculated total pAHI (per AASM  guideline):  46/h                         REM pAHI:    67/h                                             NREM pAHI:    44/h                           Positional AHI: Nearly all sleep was in supine position.      Snoring:   Very loud, mean Volume was 51 dB ,snoring was present throughout the night                                             Oxygen  Saturation Statistics:   Oxygen  Saturation (%) Mean:    92%            O2 Saturation Range (%):     Between 78 and 100% , with 13.5 minutes of hypoxia, defined as O2 Saturation (minutes) <89%.           Pulse Rate Statistics:   Pulse Mean (bpm):   93 bpm              Pulse Range:  63 through 146 (!) bpm.               IMPRESSION:  This HST confirms the presence of severe complex sleep apnea at an total AHI of  45.6 /h with 13 % central apneas being identified by this HST device ( the true number of central  apneas may be higher) , and apnea was associated  with very loud snoring and with tachycardia .  If the reported tachycardia is a manifestation of RVR in atrial fibrillation cannot be stated. This HST device cannot define cardiac rhythms. Hypoxia of sleep was at a moderate degree with an 02 Nadir at 78% and circa 14 minutes of hypoxia time.     RECOMMENDATION: Urgently starting CPAP by auto titration device , setting will be 6 through 18 cm water pressure, 2 cm water EPR , heated humidification.  A FFM will likely be recommended by the DME to address snoring.  The patient is snoring loudly in supine and may benefit from sleeping lateral or even prone.  RV between day 60 and 90 of compliant CPAP use .  To the patient:  Please use CPAP 4 hours or more very night and address any mask fitting issues or discomfort with the DME first. You are entitled to free exchanges of the mask within the first 30 days.,    INTERPRETING PHYSICIAN:   Neomia Banner, MD  [facility] Board certified by The [employer] of Sleep Medicine and Diplomate of the [employer] of [employer] Medicine. Board certified In Neurology through the ABPN, Fellow of the [employer] of Neu[employer].

## 2024-01-13 ENCOUNTER — Other Ambulatory Visit: Payer: Self-pay | Admitting: Family Medicine

## 2024-01-13 DIAGNOSIS — I10 Essential (primary) hypertension: Secondary | ICD-10-CM

## 2024-01-13 DIAGNOSIS — E7800 Pure hypercholesterolemia, unspecified: Secondary | ICD-10-CM

## 2024-02-17 NOTE — Progress Notes (Unsigned)
 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 Red 12/03/2021. He works full-time as a Estate agent. TTE 12/21/2021 revealed LVEF 60-65%, G1DD, normal RV, mild LA, no significant valve disease.   Seen by me 01/04/2023 following ED visits for chest pain. Occasional  left sided chest pain, arm pain but also constantly moving arm at work. Some shortness of breath with activity. No tachy palpitations.   Coronary CTA completed 02/10/23 with CAC score 320 (99th percentile), mild nonobstructive CAD (25-49%) with distribution of calcium  mostly in LAD and RCA, small amount in LCx. He was started on rosuvastatin  10 mg daily for LDL 100. BP was well controlled.   At follow-up visit 03/31/23 he admitted to not taking Eliquis  twice daily and was switched to Xarelto  for once daily dosing. He had occasional fleeting chest pains which were atypical for angina. Follow-up for CPAP encouraged.   History of Present Illness  Discussed the use of AI scribe software for clinical note transcription with the patient, who gave verbal consent to proceed. History of Present Illness Terry [name] is a very pleasant 53 year old male who presents for follow-up of atrial fibrillation. 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 In May, he experienced episodes of chest discomfort thought to be MSK in nature. He was referred to sports medicine. No similar episodes since then. He has atrial fibrillation and occasionally experiences 'fluttery' sensations in his chest. He switched from Eliquis  to Xarelto  for  anticoagulation, finding the once-daily dosing easier to manage and is more compliant with this regimen. He denies chest pain, shortness of breath, lower extremity edema, fatigue, presyncope, syncope, orthopnea, and PND.    ROS: See HPI  Studies Reviewed       No results found for: LIPOA  Risk Assessment/Calculations  CHA2DS2-VASc Score = 2   This indicates a 2.2% annual risk of stroke. The patient's score is based upon: CHF History: 1 HTN History: 0 Diabetes History: 0 Stroke History: 0 Vascular Disease History: 1 Age Score: 0 Gender Score: 0            Physical Exam VS:  BP 122/78   Pulse 78   Ht 5' 9 (1.753 m)   Wt 200 lb (90.7 kg)   SpO2 98%   BMI 29.53 kg/m    Wt Readings from Last 3 Encounters:  02/27/24 200 lb (90.7 kg)  12/08/23 223 lb (101.2 kg)  10/07/23 217 lb 6.4 oz (98.6 kg)    GEN: Well nourished, well developed in no acute distress NECK: No JVD; No carotid bruits CARDIAC: Irregular RR, no murmurs, rubs, gallops RESPIRATORY:  Clear to auscultation without rales, wheezing or rhonchi  ABDOMEN: Soft, non-tender, non-distended EXTREMITIES:  No edema; No deformity   Assessment & Plan Atrial Fibrillation on chronic anticoagulation He experiences intermittent AFib with an irregular heart rate. He is compliant with once daily Xarelto  dosing. We discussed the importance of early management of a fib to prevent complications. Admits he is non-compliant with CPAP.  -Refer to A Fib to discuss management options -Start metoprolol  succinate 25 mg once daily for rate and  rhythm control - Continue Xarelto  20 mg once daily which is adequate dose for stroke prevention for CHA2DS2-VASc score of 2  - Recommend follow-up of OSA  -Fitbit or Apple Watch is suggested for heart rhythm monitoring  Nonobstructive CAD Hyperlipidemia LDL < 70 Nonobstructive CAD on coronary CTA 01/2023. He remains active but does not exercise on a consistent basis.  He denies chest pain,  shortness of breath, or other symptoms concerning for angina.  No indication for further ischemia evaluation at this time.  Last lipid profile with LDL 69 on 03/31/2023. -Return for fasting lipid panel -Continue rosuvastatin , lisinopril  - We are starting metoprolol  as noted above for management of A-fib  Obstructive Sleep Apnea   He is noncompliant with CPAP which could attribute to worsening a fib. -Follow up with sleep medicine provider  Type 2 Diabetes Mellitus   A1C 6.2% on 10/07/23. He reports occasional leg numbness  in his legswhich he associates with diabetes. -Recommend eating a heart healthy, whole food diet limiting processed foods, sugar, simple carbohydrates -Management per PCP         Dispo: Refer to A Fib clinic/1 year with Dr. Wendel or APP  Signed, Rosaline Bane, NP-C

## 2024-02-24 ENCOUNTER — Encounter (HOSPITAL_BASED_OUTPATIENT_CLINIC_OR_DEPARTMENT_OTHER): Payer: Self-pay

## 2024-02-27 ENCOUNTER — Ambulatory Visit (INDEPENDENT_AMBULATORY_CARE_PROVIDER_SITE_OTHER): Admitting: Nurse Practitioner

## 2024-02-27 VITALS — BP 122/78 | HR 78 | Ht 69.0 in | Wt 200.0 lb

## 2024-02-27 DIAGNOSIS — I2510 Atherosclerotic heart disease of native coronary artery without angina pectoris: Secondary | ICD-10-CM

## 2024-02-27 DIAGNOSIS — E119 Type 2 diabetes mellitus without complications: Secondary | ICD-10-CM

## 2024-02-27 DIAGNOSIS — E785 Hyperlipidemia, unspecified: Secondary | ICD-10-CM | POA: Diagnosis not present

## 2024-02-27 DIAGNOSIS — Z7901 Long term (current) use of anticoagulants: Secondary | ICD-10-CM

## 2024-02-27 DIAGNOSIS — I480 Paroxysmal atrial fibrillation: Secondary | ICD-10-CM

## 2024-02-27 DIAGNOSIS — I700 Atherosclerosis of aorta: Secondary | ICD-10-CM | POA: Diagnosis not present

## 2024-02-27 MED ORDER — METOPROLOL SUCCINATE ER 25 MG PO TB24: 25.0000 mg | ORAL_TABLET | Freq: Every day | ORAL | 3 refills | Status: DC

## 2024-02-27 NOTE — Patient Instructions (Signed)
 Medication Instructions:   START Toprol  XL one (1) tablet by mouth ( 25 mg) daily.   *If you need a refill on your cardiac medications before your next appointment, please call your pharmacy*  Lab Work:  Your physician recommends that you return for a FASTING lipid profile the day you go to Afib clinic, fasting after midnight.    If you have labs (blood work) drawn today and your tests are completely normal, you will receive your results only by: MyChart Message (if you have MyChart) OR A paper copy in the mail If you have any lab test that is abnormal or we need to change your treatment, we will call you to review the results.  Testing/Procedures:  None ordered.  Follow-Up: At [facility], you and your health needs are our priority.  As part of our continuing mission to provide you with exceptional heart care, our providers are all part of one team.  This team includes your primary Cardiologist (physician) and Advanced Practice Providers or APPs (Physician Assistants and Nurse Practitioners) who all work together to provide you with the care you need, when you need it.  Your next appointment:   4 week(s)  Provider:   You will follow up in the Atrial Fibrillation Clinic located at [facility]. Your provider will be: Clint R. Fenton, PA-C On the 4th floor.    We recommend signing up for the patient portal called MyChart.  Sign up information is provided on this After Visit Summary.  MyChart is used to connect with patients for Virtual Visits (Telemedicine).  Patients are able to view lab/test results, encounter notes, upcoming appointments, etc.  Non-urgent messages can be sent to your provider as well.   To learn more about what you can do with MyChart, go to [email or web address].   Other Instructions You have been referred to Afib clinic.

## 2024-02-28 ENCOUNTER — Encounter (HOSPITAL_BASED_OUTPATIENT_CLINIC_OR_DEPARTMENT_OTHER): Payer: Self-pay | Admitting: Nurse Practitioner

## 2024-03-08 ENCOUNTER — Other Ambulatory Visit: Payer: Self-pay | Admitting: Family Medicine

## 2024-03-08 DIAGNOSIS — E291 Testicular hypofunction: Secondary | ICD-10-CM

## 2024-03-08 DIAGNOSIS — N529 Male erectile dysfunction, unspecified: Secondary | ICD-10-CM

## 2024-03-16 ENCOUNTER — Telehealth: Payer: Self-pay | Admitting: Internal Medicine

## 2024-03-16 ENCOUNTER — Other Ambulatory Visit: Payer: Self-pay

## 2024-03-16 MED ORDER — RIVAROXABAN 20 MG PO TABS
20.0000 mg | ORAL_TABLET | Freq: Every day | ORAL | 3 refills | Status: DC
Start: 2024-03-16 — End: 2024-03-19

## 2024-03-16 NOTE — Telephone Encounter (Signed)
*  STAT* If patient is at the pharmacy, call can be transferred to refill team.   1. Which medications need to be refilled? (please list name of each medication and dose if known)   rivaroxaban  (XARELTO ) 20 MG TABS tablet    2. Which pharmacy/location (including street and city if local pharmacy) is medication to be sent to? [id number] - [place], [place] - [address] AT [facility] OF [address]   3. Do they need a 30 day or 90 day supply? 90   Patient is out of medication

## 2024-03-16 NOTE — Telephone Encounter (Signed)
 Prescription refill request for Xarelto  received.  Indication:afib Last office visit:7/25 Weight:90.7  kg Age:53 Scr:1.19  2/25 CrCl:95.27  ml/min  Prescription refilled

## 2024-03-19 MED ORDER — RIVAROXABAN 20 MG PO TABS: 20.0000 mg | ORAL_TABLET | Freq: Every day | ORAL | 1 refills | Status: AC

## 2024-03-19 NOTE — Telephone Encounter (Signed)
 Prescription refill request for Xarelto  received.  Indication: PAF Last office visit: 02/27/24  Terry Bane NP Weight: 90.7kg Age: 53 Scr: 1.19 on 09/30/23  Epic CrCl: 95.27  Based on above findings Xarelto  20mg  daily is the appropriate dose.  Refill approved.

## 2024-03-21 ENCOUNTER — Other Ambulatory Visit: Payer: Self-pay | Admitting: Family Medicine

## 2024-03-26 ENCOUNTER — Telehealth: Payer: Self-pay | Admitting: Neurology

## 2024-03-26 NOTE — Telephone Encounter (Signed)
 Called pt and LVM for him to confirm if scheduled date is a good date and time for him for the Initial Cpap. Pt is needing to be scheduled between 9/23-11/22.

## 2024-03-27 ENCOUNTER — Ambulatory Visit (HOSPITAL_COMMUNITY)
Admission: RE | Admit: 2024-03-27 | Discharge: 2024-03-27 | Disposition: A | Discharge status: Home / Self Care | Source: Ambulatory Visit | Attending: Physician Assistant | Admitting: Physician Assistant

## 2024-03-27 VITALS — BP 106/80 | HR 91 | Ht 69.0 in | Wt 202.4 lb

## 2024-03-27 DIAGNOSIS — I4891 Unspecified atrial fibrillation: Secondary | ICD-10-CM

## 2024-03-27 DIAGNOSIS — I4819 Other persistent atrial fibrillation: Secondary | ICD-10-CM | POA: Diagnosis not present

## 2024-03-27 DIAGNOSIS — D6869 Other thrombophilia: Secondary | ICD-10-CM | POA: Diagnosis not present

## 2024-03-27 LAB — CBC
Hematocrit: 45 % (ref 37.5–51.0)
Hemoglobin: 14.9 g/dL (ref 13.0–17.7)
MCH: 28.6 pg (ref 26.6–33.0)
MCHC: 33.1 g/dL (ref 31.5–35.7)
MCV: 86 fL (ref 79–97)
Platelets: 276 x10E3/uL (ref 150–450)
RBC: 5.21 x10E6/uL (ref 4.14–5.80)
RDW: 13.7 % (ref 11.6–15.4)
WBC: 16.3 x10E3/uL — ABNORMAL HIGH (ref 3.4–10.8)

## 2024-03-27 LAB — BASIC METABOLIC PANEL WITH GFR
BUN/Creatinine Ratio: 15 (ref 9–20)
BUN: 18 mg/dL (ref 6–24)
CO2: 29 mmol/L (ref 20–29)
Calcium: 9.5 mg/dL (ref 8.7–10.2)
Chloride: 98 mmol/L (ref 96–106)
Creatinine, Ser: 1.2 mg/dL (ref 0.76–1.27)
Glucose: 103 mg/dL — ABNORMAL HIGH (ref 70–99)
Potassium: 4.4 mmol/L (ref 3.5–5.2)
Sodium: 137 mmol/L (ref 134–144)
eGFR: 74 mL/min/1.73 (ref >59)

## 2024-03-27 NOTE — Telephone Encounter (Signed)
 Phone rep called pt. He confirmed that he has not received the CPAP yet.  Pt asked the appointment be cx, and he will call back once he has CPAP

## 2024-03-27 NOTE — H&P (View-Only) (Signed)
 Primary Care Physician: Ozell Heron HERO, MD Primary Cardiologist: Lurena MARLA Red, MD Electrophysiologist: None  Referring Physician: Rosaline Bane NP   Terry [name] is a 53 y.o. male with a history of CAD/aortic atherosclerosis, OSA, HLD, CLL, DM, atrial fibrillation who presents for follow up in the [facility].  The patient was initially diagnosed with atrial fibrillation at a visit with his PCP 11/2021. Patient is on Xarelto  for stroke prevention. Seen by Rosaline Bane 02/27/24 and started on metoprolol .    Patient presents today for follow up for atrial fibrillation. He is in rate controlled afib today with symptoms of fatigue and SOB. Theses symptoms have been fairly consistent for several months. He is waiting to receive his CPAP. No bleeding issues on anticoagulation.   Today, he denies symptoms of palpitations, chest pain, orthopnea, PND, lower extremity edema, dizziness, presyncope, syncope, snoring, daytime somnolence, bleeding, or neurologic sequela. The patient is tolerating medications without difficulties and is otherwise without complaint today.    Atrial Fibrillation Risk Factors:  he does have symptoms or diagnosis of sleep apnea. he does not have a history of rheumatic fever.   Atrial Fibrillation Management history:  Previous antiarrhythmic drugs: none Previous cardioversions: none Previous ablations: none Anticoagulation history: Eliquis , Xarelto   ROS- All systems are reviewed and negative except as per the HPI above.  Past Medical History:  Diagnosis Date   Asthma    Cancer (HCC)    Diabetes mellitus without complication (HCC)    GERD (gastroesophageal reflux disease)    Hyperlipidemia    Hypertension    Opioid abuse (HCC)    following with methadone  clinic   Pneumonia     Current Outpatient Medications  Medication Sig Dispense Refill   blood glucose meter kit and supplies KIT Dispense based on patient and  insurance preference. Use up to four times daily as directed. 1 each 0   Blood Pressure Monitoring (COMFORT TOUCH BP CUFF/LARGE) MISC 1 each by Does not apply route daily as needed. 1 each 0   cyclobenzaprine  (FLEXERIL ) 10 MG tablet Take 1 tablet (10 mg total) by mouth at bedtime. 30 tablet 2   lisinopril  (ZESTRIL ) 10 MG tablet TAKE 1 TABLET(10 MG) BY MOUTH DAILY 90 tablet 0   metFORMIN  (GLUCOPHAGE -XR) 500 MG 24 hr tablet Take 1 tablet (500 mg total) by mouth daily with breakfast. 90 tablet 1   metoprolol  succinate (TOPROL  XL) 25 MG 24 hr tablet Take 1 tablet (25 mg total) by mouth daily. 90 tablet 3   modafinil  (PROVIGIL ) 200 MG tablet Take 0.5-1 tablets (100-200 mg total) by mouth daily. (Patient taking differently: Take 100-200 mg by mouth as needed.) 30 tablet 5   multivitamin-iron-minerals-folic acid  (CENTRUM) chewable tablet Chew 1 tablet by mouth daily.     Polyethylene Glycol 3350 (MIRALAX PO) Take 238 g by mouth.     rivaroxaban  (XARELTO ) 20 MG TABS tablet Take 1 tablet (20 mg total) by mouth daily with supper. 90 tablet 1   rosuvastatin  (CRESTOR ) 10 MG tablet TAKE 1 TABLET(10 MG) BY MOUTH DAILY 30 tablet 2   Semaglutide , 1 MG/DOSE, 4 MG/3ML SOPN Inject 1 mg as directed once a week. 9 mL 3   sildenafil  (VIAGRA ) 100 MG tablet TAKE 1/2 TABLET(50 MG) BY MOUTH DAILY AS NEEDED FOR ERECTILE DYSFUNCTION 30 tablet 0   Spacer/Aero-Holding Chambers (E-Z SPACER) inhaler Use as instructed 1 each 2   Spacer/Aero-Holding Chambers DEVI 1 Device by Does not apply route daily as needed.  1 each 0   tamsulosin  (FLOMAX ) 0.4 MG CAPS capsule TAKE 1 CAPSULE(0.4 MG) BY MOUTH DAILY 90 capsule 0   Testosterone  Enanthate (XYOSTED ) 100 MG/0.5ML SOAJ INJECT 0.5 ML( 100 MG TOTAL) UNDER THE SKIN ONCE A WEEK 2 mL 2   valACYclovir  (VALTREX ) 1000 MG tablet Take 2 tablets PO BID x 1 day at onset of symptoms. (Patient taking differently: Take 1,000 mg by mouth as needed. Take 2 tablets PO BID x 1 day at onset of symptoms.) 30  tablet 0   albuterol  (VENTOLIN  HFA) 108 (90 Base) MCG/ACT inhaler INHALE 1 TO 2 PUFFS INTO THE LUNGS EVERY 6 HOURS AS NEEDED FOR WHEEZING OR SHORTNESS OF BREATH 20.1 g 1   Magnesium  400 MG TABS Take 400 mg by mouth daily. (Patient not taking: Reported on 03/27/2024)     No current facility-administered medications for this encounter.    Physical Exam: BP 106/80   Pulse 91   Ht 5' 9 (1.753 m)   Wt 91.8 kg   BMI 29.89 kg/m   GEN: Well nourished, well developed in no acute distress NECK: No JVD CARDIAC: Irregularly irregular rate and rhythm, no murmurs, rubs, gallops RESPIRATORY:  Clear to auscultation without rales, wheezing or rhonchi  ABDOMEN: Soft, non-tender, non-distended EXTREMITIES:  No edema; No deformity   Wt Readings from Last 3 Encounters:  03/27/24 91.8 kg  02/27/24 90.7 kg  12/08/23 101.2 kg     EKG today demonstrates  Afib Vent. rate 91 BPM PR interval * ms QRS duration 76 ms QT/QTcB 352/432 ms   Echo 12/21/21 demonstrated   1. Left ventricular ejection fraction, by estimation, is 60 to 65%. The  left ventricle has normal function. The left ventricle has no regional  wall motion abnormalities. Left ventricular diastolic parameters are  consistent with Grade I diastolic dysfunction (impaired relaxation).   2. Right ventricular systolic function is normal. The right ventricular  size is normal. There is normal pulmonary artery systolic pressure.   3. Left atrial size was mildly dilated.   4. Right atrial size was moderately dilated.   5. The mitral valve is normal in structure. Trivial mitral valve  regurgitation. No evidence of mitral stenosis.   6. The aortic valve is tricuspid. Aortic valve regurgitation is not  visualized. No aortic stenosis is present.   7. The inferior vena cava is normal in size with greater than 50%  respiratory variability, suggesting right atrial pressure of 3 mmHg.    CHA2DS2-VASc Score = 2  The patient's score is based  upon: CHF History: 0 HTN History: 0 Diabetes History: 1 Stroke History: 0 Vascular Disease History: 1 Age Score: 0 Gender Score: 0       ASSESSMENT AND PLAN: Persistent Atrial Fibrillation (ICD10:  I48.19) The patient's CHA2DS2-VASc score is 2, indicating a 2.2% annual risk of stroke.   Patient appears to be in persistent afib, likely for a few months. We discussed rhythm control options including DDCV, AAD, and ablation. Will arrange for DCCV. Check bmet/cbc. Long term, patient is interested in ablation for rhythm control, will refer to EP.  Continue Toprol  25 mg daily Continue Xarelto  20 mg daily, no missed doses in the past 3 weeks.   Secondary Hypercoagulable State (ICD10:  D68.69) The patient is at significant risk for stroke/thromboembolism based upon his CHA2DS2-VASc Score of 2.  Continue Rivaroxaban  (Xarelto ). No bleeding issues.   OSA  The importance of adequate treatment of sleep apnea was discussed today in order to improve our  ability to maintain sinus rhythm long term. Patient waiting on CPAP to arrive.   CAD/aortic atherosclerosis No anginal symptoms Followed by Dr Wendel   Follow up with EP post DCCV to discuss ablation.    Informed Consent   Shared Decision Making/Informed Consent The risks (stroke, cardiac arrhythmias rarely resulting in the need for a temporary or permanent pacemaker, skin irritation or burns and complications associated with conscious sedation including aspiration, arrhythmia, respiratory failure and death), benefits (restoration of normal sinus rhythm) and alternatives of a direct current cardioversion were explained in detail to Mr. Virgilio and he agrees to proceed.         [facility] [address] [place], [place] [address] [phone]

## 2024-03-27 NOTE — Progress Notes (Signed)
 Primary Care Physician: Ozell Heron HERO, MD Primary Cardiologist: Lurena MARLA Red, MD Electrophysiologist: None  Referring Physician: Rosaline Bane NP   Terry [name] is a 53 y.o. male with a history of CAD/aortic atherosclerosis, OSA, HLD, CLL, DM, atrial fibrillation who presents for follow up in the [facility].  The patient was initially diagnosed with atrial fibrillation at a visit with his PCP 11/2021. Patient is on Xarelto  for stroke prevention. Seen by Rosaline Bane 02/27/24 and started on metoprolol .    Patient presents today for follow up for atrial fibrillation. He is in rate controlled afib today with symptoms of fatigue and SOB. Theses symptoms have been fairly consistent for several months. He is waiting to receive his CPAP. No bleeding issues on anticoagulation.   Today, he denies symptoms of palpitations, chest pain, orthopnea, PND, lower extremity edema, dizziness, presyncope, syncope, snoring, daytime somnolence, bleeding, or neurologic sequela. The patient is tolerating medications without difficulties and is otherwise without complaint today.    Atrial Fibrillation Risk Factors:  he does have symptoms or diagnosis of sleep apnea. he does not have a history of rheumatic fever.   Atrial Fibrillation Management history:  Previous antiarrhythmic drugs: none Previous cardioversions: none Previous ablations: none Anticoagulation history: Eliquis , Xarelto   ROS- All systems are reviewed and negative except as per the HPI above.  Past Medical History:  Diagnosis Date   Asthma    Cancer (HCC)    Diabetes mellitus without complication (HCC)    GERD (gastroesophageal reflux disease)    Hyperlipidemia    Hypertension    Opioid abuse (HCC)    following with methadone  clinic   Pneumonia     Current Outpatient Medications  Medication Sig Dispense Refill   blood glucose meter kit and supplies KIT Dispense based on patient and  insurance preference. Use up to four times daily as directed. 1 each 0   Blood Pressure Monitoring (COMFORT TOUCH BP CUFF/LARGE) MISC 1 each by Does not apply route daily as needed. 1 each 0   cyclobenzaprine  (FLEXERIL ) 10 MG tablet Take 1 tablet (10 mg total) by mouth at bedtime. 30 tablet 2   lisinopril  (ZESTRIL ) 10 MG tablet TAKE 1 TABLET(10 MG) BY MOUTH DAILY 90 tablet 0   metFORMIN  (GLUCOPHAGE -XR) 500 MG 24 hr tablet Take 1 tablet (500 mg total) by mouth daily with breakfast. 90 tablet 1   metoprolol  succinate (TOPROL  XL) 25 MG 24 hr tablet Take 1 tablet (25 mg total) by mouth daily. 90 tablet 3   modafinil  (PROVIGIL ) 200 MG tablet Take 0.5-1 tablets (100-200 mg total) by mouth daily. (Patient taking differently: Take 100-200 mg by mouth as needed.) 30 tablet 5   multivitamin-iron-minerals-folic acid  (CENTRUM) chewable tablet Chew 1 tablet by mouth daily.     Polyethylene Glycol 3350 (MIRALAX PO) Take 238 g by mouth.     rivaroxaban  (XARELTO ) 20 MG TABS tablet Take 1 tablet (20 mg total) by mouth daily with supper. 90 tablet 1   rosuvastatin  (CRESTOR ) 10 MG tablet TAKE 1 TABLET(10 MG) BY MOUTH DAILY 30 tablet 2   Semaglutide , 1 MG/DOSE, 4 MG/3ML SOPN Inject 1 mg as directed once a week. 9 mL 3   sildenafil  (VIAGRA ) 100 MG tablet TAKE 1/2 TABLET(50 MG) BY MOUTH DAILY AS NEEDED FOR ERECTILE DYSFUNCTION 30 tablet 0   Spacer/Aero-Holding Chambers (E-Z SPACER) inhaler Use as instructed 1 each 2   Spacer/Aero-Holding Chambers DEVI 1 Device by Does not apply route daily as needed.  1 each 0   tamsulosin  (FLOMAX ) 0.4 MG CAPS capsule TAKE 1 CAPSULE(0.4 MG) BY MOUTH DAILY 90 capsule 0   Testosterone  Enanthate (XYOSTED ) 100 MG/0.5ML SOAJ INJECT 0.5 ML( 100 MG TOTAL) UNDER THE SKIN ONCE A WEEK 2 mL 2   valACYclovir  (VALTREX ) 1000 MG tablet Take 2 tablets PO BID x 1 day at onset of symptoms. (Patient taking differently: Take 1,000 mg by mouth as needed. Take 2 tablets PO BID x 1 day at onset of symptoms.) 30  tablet 0   albuterol  (VENTOLIN  HFA) 108 (90 Base) MCG/ACT inhaler INHALE 1 TO 2 PUFFS INTO THE LUNGS EVERY 6 HOURS AS NEEDED FOR WHEEZING OR SHORTNESS OF BREATH 20.1 g 1   Magnesium  400 MG TABS Take 400 mg by mouth daily. (Patient not taking: Reported on 03/27/2024)     No current facility-administered medications for this encounter.    Physical Exam: BP 106/80   Pulse 91   Ht 5' 9 (1.753 m)   Wt 91.8 kg   BMI 29.89 kg/m   GEN: Well nourished, well developed in no acute distress NECK: No JVD CARDIAC: Irregularly irregular rate and rhythm, no murmurs, rubs, gallops RESPIRATORY:  Clear to auscultation without rales, wheezing or rhonchi  ABDOMEN: Soft, non-tender, non-distended EXTREMITIES:  No edema; No deformity   Wt Readings from Last 3 Encounters:  03/27/24 91.8 kg  02/27/24 90.7 kg  12/08/23 101.2 kg     EKG today demonstrates  Afib Vent. rate 91 BPM PR interval * ms QRS duration 76 ms QT/QTcB 352/432 ms   Echo 12/21/21 demonstrated   1. Left ventricular ejection fraction, by estimation, is 60 to 65%. The  left ventricle has normal function. The left ventricle has no regional  wall motion abnormalities. Left ventricular diastolic parameters are  consistent with Grade I diastolic dysfunction (impaired relaxation).   2. Right ventricular systolic function is normal. The right ventricular  size is normal. There is normal pulmonary artery systolic pressure.   3. Left atrial size was mildly dilated.   4. Right atrial size was moderately dilated.   5. The mitral valve is normal in structure. Trivial mitral valve  regurgitation. No evidence of mitral stenosis.   6. The aortic valve is tricuspid. Aortic valve regurgitation is not  visualized. No aortic stenosis is present.   7. The inferior vena cava is normal in size with greater than 50%  respiratory variability, suggesting right atrial pressure of 3 mmHg.    CHA2DS2-VASc Score = 2  The patient's score is based  upon: CHF History: 0 HTN History: 0 Diabetes History: 1 Stroke History: 0 Vascular Disease History: 1 Age Score: 0 Gender Score: 0       ASSESSMENT AND PLAN: Persistent Atrial Fibrillation (ICD10:  I48.19) The patient's CHA2DS2-VASc score is 2, indicating a 2.2% annual risk of stroke.   Patient appears to be in persistent afib, likely for a few months. We discussed rhythm control options including DDCV, AAD, and ablation. Will arrange for DCCV. Check bmet/cbc. Long term, patient is interested in ablation for rhythm control, will refer to EP.  Continue Toprol  25 mg daily Continue Xarelto  20 mg daily, no missed doses in the past 3 weeks.   Secondary Hypercoagulable State (ICD10:  D68.69) The patient is at significant risk for stroke/thromboembolism based upon his CHA2DS2-VASc Score of 2.  Continue Rivaroxaban  (Xarelto ). No bleeding issues.   OSA  The importance of adequate treatment of sleep apnea was discussed today in order to improve our  ability to maintain sinus rhythm long term. Patient waiting on CPAP to arrive.   CAD/aortic atherosclerosis No anginal symptoms Followed by Dr Wendel   Follow up with EP post DCCV to discuss ablation.    Informed Consent   Shared Decision Making/Informed Consent The risks (stroke, cardiac arrhythmias rarely resulting in the need for a temporary or permanent pacemaker, skin irritation or burns and complications associated with conscious sedation including aspiration, arrhythmia, respiratory failure and death), benefits (restoration of normal sinus rhythm) and alternatives of a direct current cardioversion were explained in detail to Mr. Virgilio and he agrees to proceed.         [facility] [address] [place], [place] [address] [phone]

## 2024-03-27 NOTE — Patient Instructions (Signed)
 Cardioversion scheduled for: Thursday, August 28th    - Arrive at the [facility] ([address]  and check in with ADMITTING at 12:00PM   - Do not eat or drink anything after midnight the night prior to your procedure.   - Take all your morning medication (except diabetic medications) with a sip of water prior to arrival.  - Do NOT miss any doses of your blood thinner - if you should miss a dose or take a dose more than 4 hours late -- please notify our office immediately.  - You will not be able to drive home after your procedure. Please ensure you have a responsible adult to drive you home. You will need someone with you for 24 hours post procedure.     - Expect to be in the procedural area approximately 2 hours.   - If you feel as if you go back into normal rhythm prior to scheduled cardioversion, please notify our office immediately.   If your procedure is canceled in the cardioversion suite you will be charged a cancellation fee.

## 2024-03-27 NOTE — Telephone Encounter (Signed)
 Cld Pt to make him aware of message from DME regarding CPAP. No answer, left detailed VM of message from DME provider along with [facility] of[facility] ph # and contact # for DME provider.

## 2024-03-28 ENCOUNTER — Ambulatory Visit (HOSPITAL_COMMUNITY): Payer: Self-pay | Admitting: Physician Assistant

## 2024-03-29 ENCOUNTER — Encounter (HOSPITAL_COMMUNITY)
Admission: RE | Disposition: A | Discharge status: Home / Self Care | Payer: Self-pay | Source: Home / Self Care | Attending: Cardiovascular Disease

## 2024-03-29 ENCOUNTER — Ambulatory Visit (HOSPITAL_COMMUNITY)
Admission: RE | Admit: 2024-03-29 | Discharge: 2024-03-29 | Disposition: A | Discharge status: Home / Self Care | Attending: Cardiovascular Disease | Admitting: Cardiovascular Disease

## 2024-03-29 ENCOUNTER — Ambulatory Visit (HOSPITAL_COMMUNITY): Admitting: Anesthesiology

## 2024-03-29 ENCOUNTER — Other Ambulatory Visit: Payer: Self-pay

## 2024-03-29 ENCOUNTER — Encounter (HOSPITAL_COMMUNITY): Payer: Self-pay | Admitting: Cardiovascular Disease

## 2024-03-29 DIAGNOSIS — E785 Hyperlipidemia, unspecified: Secondary | ICD-10-CM | POA: Diagnosis not present

## 2024-03-29 DIAGNOSIS — I10 Essential (primary) hypertension: Secondary | ICD-10-CM | POA: Insufficient documentation

## 2024-03-29 DIAGNOSIS — G4733 Obstructive sleep apnea (adult) (pediatric): Secondary | ICD-10-CM | POA: Insufficient documentation

## 2024-03-29 DIAGNOSIS — Z7901 Long term (current) use of anticoagulants: Secondary | ICD-10-CM | POA: Insufficient documentation

## 2024-03-29 DIAGNOSIS — I2510 Atherosclerotic heart disease of native coronary artery without angina pectoris: Secondary | ICD-10-CM | POA: Insufficient documentation

## 2024-03-29 DIAGNOSIS — Z7984 Long term (current) use of oral hypoglycemic drugs: Secondary | ICD-10-CM | POA: Insufficient documentation

## 2024-03-29 DIAGNOSIS — E119 Type 2 diabetes mellitus without complications: Secondary | ICD-10-CM | POA: Insufficient documentation

## 2024-03-29 DIAGNOSIS — K219 Gastro-esophageal reflux disease without esophagitis: Secondary | ICD-10-CM | POA: Insufficient documentation

## 2024-03-29 DIAGNOSIS — F1110 Opioid abuse, uncomplicated: Secondary | ICD-10-CM | POA: Insufficient documentation

## 2024-03-29 DIAGNOSIS — I4819 Other persistent atrial fibrillation: Secondary | ICD-10-CM | POA: Diagnosis present

## 2024-03-29 DIAGNOSIS — Z79899 Other long term (current) drug therapy: Secondary | ICD-10-CM | POA: Diagnosis not present

## 2024-03-29 DIAGNOSIS — D6869 Other thrombophilia: Secondary | ICD-10-CM | POA: Insufficient documentation

## 2024-03-29 DIAGNOSIS — Z856 Personal history of leukemia: Secondary | ICD-10-CM | POA: Insufficient documentation

## 2024-03-29 DIAGNOSIS — I700 Atherosclerosis of aorta: Secondary | ICD-10-CM | POA: Insufficient documentation

## 2024-03-29 DIAGNOSIS — Z7985 Long-term (current) use of injectable non-insulin antidiabetic drugs: Secondary | ICD-10-CM | POA: Diagnosis not present

## 2024-03-29 DIAGNOSIS — J45909 Unspecified asthma, uncomplicated: Secondary | ICD-10-CM | POA: Insufficient documentation

## 2024-03-29 HISTORY — PX: CARDIOVERSION: EP1203

## 2024-03-29 SURGERY — CARDIOVERSION (CATH LAB)
Anesthesia: General

## 2024-03-29 MED ORDER — LIDOCAINE 2% (20 MG/ML) 5 ML SYRINGE
INTRAMUSCULAR | Status: DC | PRN
  Administered 2024-03-29: 60 mg via INTRAVENOUS

## 2024-03-29 MED ORDER — PROPOFOL 10 MG/ML IV BOLUS
INTRAVENOUS | Status: DC | PRN
  Administered 2024-03-29: 60 mg via INTRAVENOUS

## 2024-03-29 MED ORDER — SODIUM CHLORIDE 0.9 % IV SOLN: INTRAVENOUS | Status: DC

## 2024-03-29 SURGICAL SUPPLY — 1 items: PAD DEFIB RADIO PHYSIO CONN (PAD) ×1 IMPLANT

## 2024-03-29 NOTE — Interval H&P Note (Signed)
 History and Physical Interval Note:  03/29/2024 12:55 PM  Terry [name]  has presented today for surgery, with the diagnosis of AFIB.  The various methods of treatment have been discussed with the patient and family. After consideration of risks, benefits and other options for treatment, the patient has consented to  Procedure(s): CARDIOVERSION (N/A) as a surgical intervention.  The patient's history has been reviewed, patient examined, no change in status, stable for surgery.  I have reviewed the patient's chart and labs.  Questions were answered to the patient's satisfaction.     Annabella Scarce, MD

## 2024-03-29 NOTE — Anesthesia Preprocedure Evaluation (Addendum)
 Anesthesia Evaluation  Patient identified by MRN, date of birth, ID band Patient awake    Reviewed: Allergy & Precautions, NPO status , Patient's Chart, lab work & pertinent test results, reviewed documented beta blocker date and time   Airway Mallampati: III  TM Distance: >3 FB Neck ROM: Full    Dental  (+) Teeth Intact, Dental Advisory Given   Pulmonary asthma    Pulmonary exam normal breath sounds clear to auscultation       Cardiovascular hypertension, Pt. on home beta blockers and Pt. on medications  Rhythm:Irregular Rate:Abnormal     Neuro/Psych negative neurological ROS     GI/Hepatic ,GERD  ,,(+)     substance abuse    Endo/Other  diabetes, Type 2, Oral Hypoglycemic Agents    Renal/GU negative Renal ROS     Musculoskeletal negative musculoskeletal ROS (+)  narcotic dependent  Abdominal   Peds  Hematology negative hematology ROS (+)   Anesthesia Other Findings Day of surgery medications reviewed with the patient.  Reproductive/Obstetrics                              Anesthesia Physical Anesthesia Plan  ASA: 3  Anesthesia Plan: General   Post-op Pain Management: Minimal or no pain anticipated   Induction: Intravenous  PONV Risk Score and Plan: 2 and TIVA and Treatment may vary due to age or medical condition  Airway Management Planned: Mask  Additional Equipment:   Intra-op Plan:   Post-operative Plan:   Informed Consent: I have reviewed the patients History and Physical, chart, labs and discussed the procedure including the risks, benefits and alternatives for the proposed anesthesia with the patient or authorized representative who has indicated his/her understanding and acceptance.     Dental advisory given  Plan Discussed with: CRNA  Anesthesia Plan Comments:          Anesthesia Quick Evaluation

## 2024-03-29 NOTE — Anesthesia Postprocedure Evaluation (Signed)
 Anesthesia Post Note  Patient: Terry [name]  Procedure(s) Performed: CARDIOVERSION     Patient location during evaluation: Cath Lab Anesthesia Type: General Level of consciousness: awake and alert Pain management: pain level controlled Vital Signs Assessment: post-procedure vital signs reviewed and stable Respiratory status: spontaneous breathing, nonlabored ventilation and respiratory function stable Cardiovascular status: blood pressure returned to baseline and stable Postop Assessment: no apparent nausea or vomiting Anesthetic complications: no   No notable events documented.  Last Vitals:  Vitals:   03/29/24 1316 03/29/24 1326  BP: 98/66 100/60  Pulse: (!) 53 60  Resp: 15 13  Temp:    SpO2: 95% 96%    Last Pain:  Vitals:   03/29/24 1326  TempSrc:   PainSc: 0-No pain                 Garnette FORBES Skillern

## 2024-03-29 NOTE — Discharge Instructions (Signed)

## 2024-03-29 NOTE — Transfer of Care (Signed)
 Immediate Anesthesia Transfer of Care Note  Patient: Terry [name]  Procedure(s) Performed: CARDIOVERSION  Patient Location: Cath Lab  Anesthesia Type:General  Level of Consciousness: awake, alert , and oriented  Airway & Oxygen  Therapy: Patient Spontanous Breathing and Patient connected to nasal cannula oxygen   Post-op Assessment: Report given to RN and Post -op Vital signs reviewed and stable  Post vital signs: Reviewed and stable  Last Vitals:  Vitals Value Taken Time  BP    Temp    Pulse 77 03/29/24 12:54  Resp 15 03/29/24 12:54  SpO2 89 % 03/29/24 12:54  Vitals shown include unfiled device data.  Last Pain:  Vitals:   03/29/24 1219  PainSc: 0-No pain         Complications: No notable events documented.

## 2024-03-29 NOTE — CV Procedure (Signed)
 Electrical Cardioversion Procedure Note Terry [name] [id number] [birth year]  Procedure: Electrical Cardioversion Indications:  Atrial Fibrillation  Procedure Details Consent: Risks of procedure as well as the alternatives and risks of each were explained to the (patient/caregiver).  Consent for procedure obtained. Time Out: Verified patient identification, verified procedure, site/side was marked, verified correct patient position, special equipment/implants available, medications/allergies/relevent history reviewed, required imaging and test results available.  Performed  Patient placed on cardiac monitor, pulse oximetry, supplemental oxygen  as necessary.  Sedation given: propofol  Pacer pads placed anterior and posterior chest.  Cardioverted 1 time(s).  Cardioverted at 200J.  Evaluation Findings: Post procedure EKG shows: NSR Complications: None Patient did tolerate procedure well.   Annabella Scarce, MD 03/29/2024, 1:04 PM

## 2024-03-30 ENCOUNTER — Inpatient Hospital Stay: Payer: BC Managed Care – PPO | Attending: Family Medicine

## 2024-03-30 ENCOUNTER — Encounter (HOSPITAL_COMMUNITY): Payer: Self-pay | Admitting: Cardiovascular Disease

## 2024-04-03 LAB — HM DIABETES EYE EXAM

## 2024-04-18 ENCOUNTER — Other Ambulatory Visit: Payer: Self-pay | Admitting: Family Medicine

## 2024-04-18 DIAGNOSIS — E291 Testicular hypofunction: Secondary | ICD-10-CM

## 2024-04-19 ENCOUNTER — Other Ambulatory Visit: Payer: Self-pay | Admitting: Family Medicine

## 2024-04-19 DIAGNOSIS — E1165 Type 2 diabetes mellitus with hyperglycemia: Secondary | ICD-10-CM

## 2024-04-20 ENCOUNTER — Encounter (HOSPITAL_BASED_OUTPATIENT_CLINIC_OR_DEPARTMENT_OTHER): Payer: Self-pay

## 2024-04-23 NOTE — Progress Notes (Signed)
 Electrophysiology Office Note:   Date:  04/25/2024  ID:  Terry [name], DOB 07-06-1971, MRN [id number]  Primary Cardiologist: Arun K Thukkani, MD Electrophysiologist: Fonda Kitty, MD      History of Present Illness:   Terry [name] is a 53 y.o. male with h/o CAD/aortic atherosclerosis, OSA, HLD, CLL, DM, atrial fibrillation who is being seen today for evaluation for catheter ablation at the request of [name], PA.  Discussed the use of AI scribe software for clinical note transcription with the patient, who gave verbal consent to proceed.  History of Present Illness Terry [name] is a 53 year old male with atrial fibrillation who presents for evaluation and discussion of potential ablation.  Atrial fibrillation was diagnosed approximately two to three years ago during a routine checkup when an irregular heartbeat was noted. He was started on blood thinners and has been on them since diagnosis. He does not experience specific symptoms associated with atrial fibrillation, such as fluttering sensations, but reports significant fatigue, described as 'energy was just zapped.' This fatigue improved following a cardioversion procedure performed at the end of August 2025, although he reverted back to atrial fibrillation within a month.  He was recently prescribed metoprolol  but has not been taking it consistently due to concerns about potential side effects, including fatigue. He has taken it a few times and experienced some side effects, though he is unsure if they were due to the medication or the atrial fibrillation itself.  He has undergone a sleep study and was diagnosed with sleep apnea but has not yet started using a CPAP machine. He needs to follow up to obtain the device.  He was previously taking Eliquis  but found it challenging to remember to take both doses consistently due to his busy schedule. He was changed to once daily Xarelto  and reports improved compliance.  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   Review of systems complete and found to be negative unless listed in HPI.   EP Information / Studies Reviewed:    EKG is ordered today. Personal review as below.  EKG Interpretation Date/Time:  Tuesday April 24 2024 14:09:40 EDT Ventricular Rate:  111 PR Interval:    QRS Duration:  72 QT Interval:  324 QTC Calculation: 440 R Axis:   27  Text Interpretation: Atrial fibrillation with rapid ventricular response When compared with ECG of 29-Mar-2024 13:16, Atrial fibrillation has replaced Sinus rhythm Vent. rate has increased BY  55 BPM T wave amplitude has decreased in Lateral leads Confirmed by Kitty Fonda [phone]) on 04/24/2024 2:39:42 PM   Echo 03/27/24: AF   Coronary CTA 02/10/23:  IMPRESSION: 1. Coronary calcium  score of 320. This was 65 percentile for age and sex matched control.   2. Normal coronary origin with right dominance.   3. CAD-RADS 2. Mild non-obstructive CAD (25-49%). Consider non-atherosclerotic causes of chest pain. Consider preventive therapy and risk factor modification.  Echo 12/21/21:   1. Left ventricular ejection fraction, by estimation, is 60 to 65%. The  left ventricle has normal function. The left ventricle has no regional  wall motion abnormalities. Left ventricular diastolic parameters are  consistent with Grade I diastolic  dysfunction (impaired relaxation).   2. Right ventricular systolic function is normal. The right ventricular  size is normal. There is normal pulmonary artery systolic pressure.   3. Left atrial size was mildly dilated.   4.  Right atrial size was moderately dilated.   5. The mitral valve is normal in structure. Trivial mitral valve  regurgitation. No evidence of mitral stenosis.   6. The aortic valve is tricuspid. Aortic valve regurgitation is not  visualized. No aortic stenosis is  present.   7. The inferior vena cava is normal in size with greater than 50%  respiratory variability, suggesting right atrial pressure of 3 mmHg.    Risk Assessment/Calculations:    CHA2DS2-VASc Score = 2   This indicates a 2.2% annual risk of stroke. The patient's score is based upon: CHF History: 0 HTN History: 0 Diabetes History: 1 Stroke History: 0 Vascular Disease History: 1 Age Score: 0 Gender Score: 0             Physical Exam:   VS:  BP 134/76 (BP Location: Left Arm, Patient Position: Sitting, Cuff Size: Large)   Pulse (!) 111   Ht 5' 9 (1.753 m)   Wt 209 lb 3.2 oz (94.9 kg)   SpO2 95%   BMI 30.89 kg/m    Wt Readings from Last 3 Encounters:  04/24/24 209 lb 3.2 oz (94.9 kg)  03/27/24 202 lb 6.4 oz (91.8 kg)  02/27/24 200 lb (90.7 kg)     GEN: Well nourished, well developed in no acute distress NECK: No JVD CARDIAC: Tachycardic, irregular RESPIRATORY:  Clear to auscultation without rales, wheezing or rhonchi  ABDOMEN: Soft, non-distended EXTREMITIES:  No edema; No deformity   ASSESSMENT AND PLAN:    #. Persistent Atrial Fibrillation: Symptomatic.  -Discussed treatment options today for AF including antiarrhythmic drug therapy and ablation. Discussed risks, recovery and likelihood of success with each treatment strategy. Risk, benefits, and alternatives to EP study and ablation for afib were discussed. These risks include but are not limited to stroke, bleeding, vascular damage, tamponade, perforation, damage to the esophagus, lungs, phrenic nerve and other structures, pulmonary vein stenosis, worsening renal function, coronary vasospasm and death.  Discussed potential need for repeat ablation procedures and antiarrhythmic drugs after an initial ablation. The patient understands these risk and wishes to proceed.  We will therefore proceed with catheter ablation at the next available time.  Carto, ICE, anesthesia are requested for the procedure.   We will not  obtain CT PV protocol prior to the procedure. -Some fatigue with metoprolol , not taking consistently. Change metoprolol  to diltiazem  120mg  once daily.    #. Secondary Hypercoagulable State Due To AF: -The patient is at significant risk for stroke/thromboembolism based upon his CHA2DS2-VASc Score of 2.  Continue Rivaroxaban  (Xarelto ). No bleeding issues.    #. OSA: He reports positive sleep study but has not acquired CPAP. -Educated patient on increased AF burden with untreated OSA. Encouraged CPAP.   Follow up with Dr. Kennyth 3 months after ablation.    Signed, Fonda Kennyth, MD

## 2024-04-23 NOTE — H&P (View-Only) (Signed)
 Electrophysiology Office Note:   Date:  04/25/2024  ID:  Terry [name], DOB 07-06-1971, MRN [id number]  Primary Cardiologist: Arun K Thukkani, MD Electrophysiologist: Fonda Kitty, MD      History of Present Illness:   Terry [name] is a 53 y.o. male with h/o CAD/aortic atherosclerosis, OSA, HLD, CLL, DM, atrial fibrillation who is being seen today for evaluation for catheter ablation at the request of [name], PA.  Discussed the use of AI scribe software for clinical note transcription with the patient, who gave verbal consent to proceed.  History of Present Illness Terry [name] is a 53 year old male with atrial fibrillation who presents for evaluation and discussion of potential ablation.  Atrial fibrillation was diagnosed approximately two to three years ago during a routine checkup when an irregular heartbeat was noted. He was started on blood thinners and has been on them since diagnosis. He does not experience specific symptoms associated with atrial fibrillation, such as fluttering sensations, but reports significant fatigue, described as 'energy was just zapped.' This fatigue improved following a cardioversion procedure performed at the end of August 2025, although he reverted back to atrial fibrillation within a month.  He was recently prescribed metoprolol  but has not been taking it consistently due to concerns about potential side effects, including fatigue. He has taken it a few times and experienced some side effects, though he is unsure if they were due to the medication or the atrial fibrillation itself.  He has undergone a sleep study and was diagnosed with sleep apnea but has not yet started using a CPAP machine. He needs to follow up to obtain the device.  He was previously taking Eliquis  but found it challenging to remember to take both doses consistently due to his busy schedule. He was changed to once daily Xarelto  and reports improved compliance.  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   Review of systems complete and found to be negative unless listed in HPI.   EP Information / Studies Reviewed:    EKG is ordered today. Personal review as below.  EKG Interpretation Date/Time:  Tuesday April 24 2024 14:09:40 EDT Ventricular Rate:  111 PR Interval:    QRS Duration:  72 QT Interval:  324 QTC Calculation: 440 R Axis:   27  Text Interpretation: Atrial fibrillation with rapid ventricular response When compared with ECG of 29-Mar-2024 13:16, Atrial fibrillation has replaced Sinus rhythm Vent. rate has increased BY  55 BPM T wave amplitude has decreased in Lateral leads Confirmed by Kitty Fonda [phone]) on 04/24/2024 2:39:42 PM   Echo 03/27/24: AF   Coronary CTA 02/10/23:  IMPRESSION: 1. Coronary calcium  score of 320. This was 65 percentile for age and sex matched control.   2. Normal coronary origin with right dominance.   3. CAD-RADS 2. Mild non-obstructive CAD (25-49%). Consider non-atherosclerotic causes of chest pain. Consider preventive therapy and risk factor modification.  Echo 12/21/21:   1. Left ventricular ejection fraction, by estimation, is 60 to 65%. The  left ventricle has normal function. The left ventricle has no regional  wall motion abnormalities. Left ventricular diastolic parameters are  consistent with Grade I diastolic  dysfunction (impaired relaxation).   2. Right ventricular systolic function is normal. The right ventricular  size is normal. There is normal pulmonary artery systolic pressure.   3. Left atrial size was mildly dilated.   4.  Right atrial size was moderately dilated.   5. The mitral valve is normal in structure. Trivial mitral valve  regurgitation. No evidence of mitral stenosis.   6. The aortic valve is tricuspid. Aortic valve regurgitation is not  visualized. No aortic stenosis is  present.   7. The inferior vena cava is normal in size with greater than 50%  respiratory variability, suggesting right atrial pressure of 3 mmHg.    Risk Assessment/Calculations:    CHA2DS2-VASc Score = 2   This indicates a 2.2% annual risk of stroke. The patient's score is based upon: CHF History: 0 HTN History: 0 Diabetes History: 1 Stroke History: 0 Vascular Disease History: 1 Age Score: 0 Gender Score: 0             Physical Exam:   VS:  BP 134/76 (BP Location: Left Arm, Patient Position: Sitting, Cuff Size: Large)   Pulse (!) 111   Ht 5' 9 (1.753 m)   Wt 209 lb 3.2 oz (94.9 kg)   SpO2 95%   BMI 30.89 kg/m    Wt Readings from Last 3 Encounters:  04/24/24 209 lb 3.2 oz (94.9 kg)  03/27/24 202 lb 6.4 oz (91.8 kg)  02/27/24 200 lb (90.7 kg)     GEN: Well nourished, well developed in no acute distress NECK: No JVD CARDIAC: Tachycardic, irregular RESPIRATORY:  Clear to auscultation without rales, wheezing or rhonchi  ABDOMEN: Soft, non-distended EXTREMITIES:  No edema; No deformity   ASSESSMENT AND PLAN:    #. Persistent Atrial Fibrillation: Symptomatic.  -Discussed treatment options today for AF including antiarrhythmic drug therapy and ablation. Discussed risks, recovery and likelihood of success with each treatment strategy. Risk, benefits, and alternatives to EP study and ablation for afib were discussed. These risks include but are not limited to stroke, bleeding, vascular damage, tamponade, perforation, damage to the esophagus, lungs, phrenic nerve and other structures, pulmonary vein stenosis, worsening renal function, coronary vasospasm and death.  Discussed potential need for repeat ablation procedures and antiarrhythmic drugs after an initial ablation. The patient understands these risk and wishes to proceed.  We will therefore proceed with catheter ablation at the next available time.  Carto, ICE, anesthesia are requested for the procedure.   We will not  obtain CT PV protocol prior to the procedure. -Some fatigue with metoprolol , not taking consistently. Change metoprolol  to diltiazem  120mg  once daily.    #. Secondary Hypercoagulable State Due To AF: -The patient is at significant risk for stroke/thromboembolism based upon his CHA2DS2-VASc Score of 2.  Continue Rivaroxaban  (Xarelto ). No bleeding issues.    #. OSA: He reports positive sleep study but has not acquired CPAP. -Educated patient on increased AF burden with untreated OSA. Encouraged CPAP.   Follow up with Dr. Kennyth 3 months after ablation.    Signed, Fonda Kennyth, MD

## 2024-04-24 ENCOUNTER — Encounter: Payer: Self-pay | Admitting: Cardiology

## 2024-04-24 ENCOUNTER — Ambulatory Visit: Attending: Cardiology | Admitting: Cardiology

## 2024-04-24 VITALS — BP 134/76 | HR 111 | Ht 69.0 in | Wt 209.2 lb

## 2024-04-24 DIAGNOSIS — G4733 Obstructive sleep apnea (adult) (pediatric): Secondary | ICD-10-CM | POA: Diagnosis not present

## 2024-04-24 DIAGNOSIS — D6869 Other thrombophilia: Secondary | ICD-10-CM

## 2024-04-24 DIAGNOSIS — I4819 Other persistent atrial fibrillation: Secondary | ICD-10-CM

## 2024-04-24 MED ORDER — DILTIAZEM HCL ER COATED BEADS 120 MG PO CP24: 120.0000 mg | ORAL_CAPSULE | Freq: Every day | ORAL | 3 refills | Status: AC

## 2024-04-24 NOTE — Patient Instructions (Signed)
 Medication Instructions:  Your physician has recommended you make the following change in your medication:  1) STOP taking Toprol  XL (metoprolol  succinate)  2) START taking Cardizem  (diltiazem ) 120 mg daily   *If you need a refill on your cardiac medications before your next appointment, please call your pharmacy*  Testing/Procedures: Ablation Your physician has recommended that you have an ablation. Catheter ablation is a medical procedure used to treat some cardiac arrhythmias (irregular heartbeats). During catheter ablation, a long, thin, flexible tube is put into a blood vessel in your groin (upper thigh), or neck. This tube is called an ablation catheter. It is then guided to your heart through the blood vessel. Radio frequency waves destroy small areas of heart tissue where abnormal heartbeats may cause an arrhythmia to start.   You are scheduled for Atrial Fibrillation Ablation on ,   with Dr. Sidra Kitty.Please arrive at the Main Entrance A at [facility]: [address] [place], [place] [address] at 12:30 PM   What To Expect:  Labs: you will need to have lab work drawn within 30 days of your procedure. Please go to any [facility] location to have these drawn - no appointment is needed. You will receive procedure instructions either through MyChart or in the mail 4-6 week prior to your procedure.  After your procedure we recommend no driving for 4 days, no lifting over 5 lbs for 7 days, and no work or strenuous activity for 7 days.  Please contact our office at [phone] if you have any questions.   Follow-Up: We will contact you to schedule your post-procedure appointments.

## 2024-05-01 ENCOUNTER — Other Ambulatory Visit: Payer: Self-pay

## 2024-05-01 DIAGNOSIS — I4819 Other persistent atrial fibrillation: Secondary | ICD-10-CM

## 2024-05-02 ENCOUNTER — Telehealth: Payer: Self-pay

## 2024-05-02 NOTE — Telephone Encounter (Signed)
Work up complete. 

## 2024-05-02 NOTE — Telephone Encounter (Signed)
-----   Message from Nurse Carlyle C sent at 05/01/2024  2:41 PM EDT ----- Regarding: 10/03 afib ablation  Precert:  MD: Kennyth Type of ablation: A-fib Diagnosis: A-fib CPT code: A-fib (06343) Ablation scheduled (date/time): 10/3 at 12:30  Procedure:  Added to calendar? Yes Orders entered? Yes Letter complete? Yes Scheduled with cath lab? Yes Any medications to hold? No Labs ordered (CBC, BMET, PT/INR if on warfarin): Yes Mapping system: Doesn't matter CARTO/OPAL rep notified? No Cardiac CT needed? No Dye allergy? No Pre-meds ordered and instructions given? No, not needed Letter method: MyChart H&P: 9/23 Device: No  Follow-up:  Cassie/Angel, please schedule Routine.  Covering RN - please send this message to [employer], EP scheduler, EP Scheduling pool, EP [employer], and CT scheduler ([place] Lynch/Stephanie Mogg), if indicated.

## 2024-05-03 LAB — CBC
Hematocrit: 49.7 % (ref 37.5–51.0)
Hemoglobin: 16.7 g/dL (ref 13.0–17.7)
MCH: 28.7 pg (ref 26.6–33.0)
MCHC: 33.6 g/dL (ref 31.5–35.7)
MCV: 85 fL (ref 79–97)
Platelets: 332 x10E3/uL (ref 150–450)
RBC: 5.82 x10E6/uL — ABNORMAL HIGH (ref 4.14–5.80)
RDW: 12.1 % (ref 11.6–15.4)
WBC: 15.9 x10E3/uL — ABNORMAL HIGH (ref 3.4–10.8)

## 2024-05-03 LAB — BASIC METABOLIC PANEL WITH GFR
BUN/Creatinine Ratio: 12 (ref 9–20)
BUN: 13 mg/dL (ref 6–24)
CO2: 22 mmol/L (ref 20–29)
Calcium: 9.9 mg/dL (ref 8.7–10.2)
Chloride: 99 mmol/L (ref 96–106)
Creatinine, Ser: 1.07 mg/dL (ref 0.76–1.27)
Glucose: 165 mg/dL — AB (ref 70–99)
Potassium: 4.6 mmol/L (ref 3.5–5.2)
Sodium: 137 mmol/L (ref 134–144)
eGFR: 85 mL/min/1.73 (ref >59)

## 2024-05-04 ENCOUNTER — Ambulatory Visit (HOSPITAL_COMMUNITY): Admitting: Anesthesiology

## 2024-05-04 ENCOUNTER — Ambulatory Visit (HOSPITAL_COMMUNITY)
Admission: RE | Admit: 2024-05-04 | Discharge: 2024-05-05 | Disposition: A | Discharge status: Home / Self Care | Attending: Cardiology | Admitting: Cardiology

## 2024-05-04 ENCOUNTER — Ambulatory Visit (HOSPITAL_COMMUNITY)
Admission: RE | Disposition: A | Discharge status: Home / Self Care | Payer: Self-pay | Source: Home / Self Care | Attending: Cardiology

## 2024-05-04 ENCOUNTER — Other Ambulatory Visit: Payer: Self-pay

## 2024-05-04 DIAGNOSIS — Z7984 Long term (current) use of oral hypoglycemic drugs: Secondary | ICD-10-CM | POA: Diagnosis not present

## 2024-05-04 DIAGNOSIS — I4891 Unspecified atrial fibrillation: Secondary | ICD-10-CM | POA: Diagnosis present

## 2024-05-04 DIAGNOSIS — I2510 Atherosclerotic heart disease of native coronary artery without angina pectoris: Secondary | ICD-10-CM | POA: Diagnosis not present

## 2024-05-04 DIAGNOSIS — G4733 Obstructive sleep apnea (adult) (pediatric): Secondary | ICD-10-CM | POA: Diagnosis not present

## 2024-05-04 DIAGNOSIS — I4819 Other persistent atrial fibrillation: Secondary | ICD-10-CM | POA: Insufficient documentation

## 2024-05-04 DIAGNOSIS — Z79899 Other long term (current) drug therapy: Secondary | ICD-10-CM | POA: Diagnosis not present

## 2024-05-04 DIAGNOSIS — D6869 Other thrombophilia: Secondary | ICD-10-CM | POA: Insufficient documentation

## 2024-05-04 DIAGNOSIS — I700 Atherosclerosis of aorta: Secondary | ICD-10-CM | POA: Diagnosis not present

## 2024-05-04 DIAGNOSIS — E119 Type 2 diabetes mellitus without complications: Secondary | ICD-10-CM | POA: Insufficient documentation

## 2024-05-04 DIAGNOSIS — I10 Essential (primary) hypertension: Secondary | ICD-10-CM | POA: Insufficient documentation

## 2024-05-04 DIAGNOSIS — C9110 Chronic lymphocytic leukemia of B-cell type not having achieved remission: Secondary | ICD-10-CM | POA: Insufficient documentation

## 2024-05-04 DIAGNOSIS — Z7901 Long term (current) use of anticoagulants: Secondary | ICD-10-CM | POA: Insufficient documentation

## 2024-05-04 DIAGNOSIS — E785 Hyperlipidemia, unspecified: Secondary | ICD-10-CM | POA: Diagnosis not present

## 2024-05-04 HISTORY — PX: ATRIAL FIBRILLATION ABLATION: EP1191

## 2024-05-04 LAB — GLUCOSE, CAPILLARY
Glucose-Capillary: 117 mg/dL — ABNORMAL HIGH (ref 70–99)
Glucose-Capillary: 131 mg/dL — ABNORMAL HIGH (ref 70–99)

## 2024-05-04 LAB — POCT ACTIVATED CLOTTING TIME: Activated Clotting Time: 320 s

## 2024-05-04 SURGERY — ATRIAL FIBRILLATION ABLATION
Anesthesia: General

## 2024-05-04 MED ORDER — LIDOCAINE 2% (20 MG/ML) 5 ML SYRINGE
INTRAMUSCULAR | Status: DC | PRN
  Administered 2024-05-04: 40 mg via INTRAVENOUS

## 2024-05-04 MED ORDER — HEPARIN (PORCINE) IN NACL 1000-0.9 UT/500ML-% IV SOLN
INTRAVENOUS | Status: DC | PRN
  Administered 2024-05-04 (×3): 500 mL

## 2024-05-04 MED ORDER — ACETAMINOPHEN 325 MG PO TABS: 650.0000 mg | ORAL_TABLET | ORAL | Status: DC | PRN

## 2024-05-04 MED ORDER — PROTAMINE SULFATE 10 MG/ML IV SOLN
INTRAVENOUS | Status: DC | PRN
  Administered 2024-05-04: 35 mg via INTRAVENOUS

## 2024-05-04 MED ORDER — SODIUM CHLORIDE 0.9 % IV SOLN: INTRAVENOUS | Status: DC

## 2024-05-04 MED ORDER — FENTANYL CITRATE (PF) 250 MCG/5ML IJ SOLN
INTRAMUSCULAR | Status: DC | PRN
  Administered 2024-05-04: 50 ug via INTRAVENOUS

## 2024-05-04 MED ORDER — EPHEDRINE SULFATE-NACL 50-0.9 MG/10ML-% IV SOSY
PREFILLED_SYRINGE | INTRAVENOUS | Status: DC | PRN
  Administered 2024-05-04: 5 mg via INTRAVENOUS

## 2024-05-04 MED ORDER — RIVAROXABAN 20 MG PO TABS: 20.0000 mg | ORAL_TABLET | Freq: Every day | ORAL | Status: DC

## 2024-05-04 MED ORDER — ONDANSETRON HCL 4 MG/2ML IJ SOLN
INTRAMUSCULAR | Status: DC | PRN
  Administered 2024-05-04: 4 mg via INTRAVENOUS

## 2024-05-04 MED ORDER — DILTIAZEM HCL ER COATED BEADS 120 MG PO CP24
120.0000 mg | ORAL_CAPSULE | Freq: Every day | ORAL | Status: DC
  Administered 2024-05-04 – 2024-05-05 (×2): 120 mg via ORAL
  Filled 2024-05-04 (×2): qty 1

## 2024-05-04 MED ORDER — METFORMIN HCL ER 500 MG PO TB24
500.0000 mg | ORAL_TABLET | Freq: Every day | ORAL | Status: DC
  Administered 2024-05-05: 500 mg via ORAL
  Filled 2024-05-04: qty 1

## 2024-05-04 MED ORDER — TAMSULOSIN HCL 0.4 MG PO CAPS
0.4000 mg | ORAL_CAPSULE | Freq: Every day | ORAL | Status: DC
  Administered 2024-05-04: 0.4 mg via ORAL
  Filled 2024-05-04: qty 1

## 2024-05-04 MED ORDER — HEPARIN SODIUM (PORCINE) 1000 UNIT/ML IJ SOLN
INTRAMUSCULAR | Status: AC
  Filled 2024-05-04: qty 20

## 2024-05-04 MED ORDER — SODIUM CHLORIDE 0.9 % IV SOLN: 250.0000 mL | INTRAVENOUS | Status: DC | PRN

## 2024-05-04 MED ORDER — RIVAROXABAN 20 MG PO TABS
20.0000 mg | ORAL_TABLET | Freq: Once | ORAL | Status: AC
  Administered 2024-05-04: 20 mg via ORAL
  Filled 2024-05-04: qty 1

## 2024-05-04 MED ORDER — FENTANYL CITRATE (PF) 100 MCG/2ML IJ SOLN
INTRAMUSCULAR | Status: AC
  Filled 2024-05-04: qty 2

## 2024-05-04 MED ORDER — ALBUTEROL SULFATE (2.5 MG/3ML) 0.083% IN NEBU: 3.0000 mL | INHALATION_SOLUTION | Freq: Four times a day (QID) | RESPIRATORY_TRACT | Status: DC | PRN

## 2024-05-04 MED ORDER — SUGAMMADEX SODIUM 200 MG/2ML IV SOLN
INTRAVENOUS | Status: DC | PRN
  Administered 2024-05-04: 200 mg via INTRAVENOUS

## 2024-05-04 MED ORDER — PROPOFOL 10 MG/ML IV BOLUS
INTRAVENOUS | Status: DC | PRN
  Administered 2024-05-04: 140 mg via INTRAVENOUS

## 2024-05-04 MED ORDER — AMISULPRIDE (ANTIEMETIC) 5 MG/2ML IV SOLN: 10.0000 mg | Freq: Once | INTRAVENOUS | Status: DC | PRN

## 2024-05-04 MED ORDER — HEPARIN SODIUM (PORCINE) 1000 UNIT/ML IJ SOLN
INTRAMUSCULAR | Status: DC | PRN
  Administered 2024-05-04: 16000 [IU] via INTRAVENOUS

## 2024-05-04 MED ORDER — SODIUM CHLORIDE 0.9% FLUSH: 3.0000 mL | INTRAVENOUS | Status: DC | PRN

## 2024-05-04 MED ORDER — ROSUVASTATIN CALCIUM 5 MG PO TABS
10.0000 mg | ORAL_TABLET | Freq: Every day | ORAL | Status: DC
  Administered 2024-05-04 – 2024-05-05 (×2): 10 mg via ORAL
  Filled 2024-05-04 (×2): qty 2

## 2024-05-04 MED ORDER — PHENYLEPHRINE HCL-NACL 20-0.9 MG/250ML-% IV SOLN
INTRAVENOUS | Status: DC | PRN
  Administered 2024-05-04: 30 ug/min via INTRAVENOUS

## 2024-05-04 MED ORDER — ATROPINE SULFATE 1 MG/10ML IJ SOSY
PREFILLED_SYRINGE | INTRAMUSCULAR | Status: DC | PRN
  Administered 2024-05-04: 1 mg via INTRAVENOUS

## 2024-05-04 MED ORDER — ONDANSETRON HCL 4 MG/2ML IJ SOLN: 4.0000 mg | Freq: Once | INTRAMUSCULAR | Status: DC | PRN

## 2024-05-04 MED ORDER — MIDAZOLAM HCL 5 MG/5ML IJ SOLN
INTRAMUSCULAR | Status: AC
  Filled 2024-05-04: qty 5

## 2024-05-04 MED ORDER — MIDAZOLAM HCL 2 MG/2ML IJ SOLN
INTRAMUSCULAR | Status: DC | PRN
  Administered 2024-05-04: 2 mg via INTRAVENOUS

## 2024-05-04 MED ORDER — ROCURONIUM BROMIDE 10 MG/ML (PF) SYRINGE
PREFILLED_SYRINGE | INTRAVENOUS | Status: DC | PRN
  Administered 2024-05-04: 50 mg via INTRAVENOUS

## 2024-05-04 MED ORDER — PHENYLEPHRINE 80 MCG/ML (10ML) SYRINGE FOR IV PUSH (FOR BLOOD PRESSURE SUPPORT)
PREFILLED_SYRINGE | INTRAVENOUS | Status: DC | PRN
  Administered 2024-05-04 (×2): 160 ug via INTRAVENOUS
  Administered 2024-05-04: 80 ug via INTRAVENOUS
  Administered 2024-05-04: 120 ug via INTRAVENOUS
  Administered 2024-05-04 (×2): 160 ug via INTRAVENOUS

## 2024-05-04 MED ORDER — SODIUM CHLORIDE 0.9% FLUSH
3.0000 mL | Freq: Two times a day (BID) | INTRAVENOUS | Status: DC
  Administered 2024-05-04 – 2024-05-05 (×2): 3 mL via INTRAVENOUS

## 2024-05-04 MED ORDER — ONDANSETRON HCL 4 MG/2ML IJ SOLN: 4.0000 mg | Freq: Four times a day (QID) | INTRAMUSCULAR | Status: DC | PRN

## 2024-05-04 MED ORDER — LISINOPRIL 10 MG PO TABS
10.0000 mg | ORAL_TABLET | Freq: Every day | ORAL | Status: DC
  Administered 2024-05-05: 10 mg via ORAL
  Filled 2024-05-04: qty 1

## 2024-05-04 SURGICAL SUPPLY — 18 items
CABLE FARASTAR GEN2 SNGL USE (CABLE) IMPLANT
CATH FARAWAVE 2.0 31 (CATHETERS) IMPLANT
CATH GE 8FR SOUNDSTAR (CATHETERS) IMPLANT
CATH OCTARAY 2.0 F 3-3-3-3-3 (CATHETERS) IMPLANT
CATH WEBSTER BI DIR CS D-F CRV (CATHETERS) IMPLANT
CLOSURE PERCLOSE PROSTYLE (Vascular Products) IMPLANT
COVER SWIFTLINK CONNECTOR (BAG) ×1 IMPLANT
DEVICE CLOSURE MYNXGRIP 6/7F (Vascular Products) IMPLANT
DILATOR VESSEL 38 20CM 16FR (INTRODUCER) IMPLANT
GUIDEWIRE INQWIRE 1.5J.035X260 (WIRE) IMPLANT
KIT VERSACROSS CNCT FARADRIVE (KITS) IMPLANT
PACK EP LF (CUSTOM PROCEDURE TRAY) ×1 IMPLANT
PAD DEFIB RADIO PHYSIO CONN (PAD) ×1 IMPLANT
PATCH CARTO3 (PAD) IMPLANT
SHEATH FARADRIVE STEERABLE (SHEATH) IMPLANT
SHEATH PINNACLE 8F 10CM (SHEATH) IMPLANT
SHEATH PINNACLE 9F 10CM (SHEATH) IMPLANT
SHEATH PROBE COVER 6X72 (BAG) IMPLANT

## 2024-05-04 NOTE — Anesthesia Procedure Notes (Addendum)
 Procedure Name: Intubation Date/Time: 05/04/2024 11:50 AM  Performed by: Capitola Ladson J, CRNAPre-anesthesia Checklist: Patient identified, Emergency Drugs available, Suction available and Patient being monitored Patient Re-evaluated:Patient Re-evaluated prior to induction Oxygen  Delivery Method: Circle System Utilized Preoxygenation: Pre-oxygenation with 100% oxygen  Induction Type: IV induction Ventilation: Mask ventilation without difficulty Laryngoscope Size: Miller and 3 Grade View: Grade II Tube type: Oral Tube size: 7.5 mm Number of attempts: 1 Airway Equipment and Method: Stylet and Oral airway Placement Confirmation: ETT inserted through vocal cords under direct vision, positive ETCO2 and breath sounds checked- equal and bilateral Secured at: 23 cm Tube secured with: Tape Dental Injury: Teeth and Oropharynx as per pre-operative assessment

## 2024-05-04 NOTE — Interval H&P Note (Signed)
 History and Physical Interval Note:  05/04/2024 11:14 AM  Terry [name]  has presented today for surgery, with the diagnosis of symptomatic persistent atrial fibrillation.  The various methods of treatment have been discussed with the patient and family. After consideration of risks, benefits and other options for treatment, the patient has consented to  Procedure(s): ATRIAL FIBRILLATION ABLATION (N/A) as a surgical intervention.  The patient's history has been reviewed, patient examined, no change in status, stable for surgery.  I have reviewed the patient's chart and labs.  Questions were answered to the patient's satisfaction.     Fonda Kitty

## 2024-05-04 NOTE — Plan of Care (Signed)
  Problem: Education: Goal: Understanding of disease, treatment, and recovery process will improve Outcome: Progressing   Problem: Activity: Goal: Ability to return to baseline activity level will improve Outcome: Progressing   Problem: Cardiac: Goal: Vascular access site(s) Level 0-1 will be maintained Outcome: Progressing   Problem: Health Behavior/ Discharge Planning: Goal: Ability to safely manage health related needs after discharge Outcome: Progressing   Problem: Education: Goal: Knowledge of General Education information will improve Description: Including pain rating scale, medication(s)/side effects and non-pharmacologic comfort measures Outcome: Progressing   Problem: Health Behavior/Discharge Planning: Goal: Ability to manage health-related needs will improve Outcome: Progressing   Problem: Clinical Measurements: Goal: Ability to maintain clinical measurements within normal limits will improve Outcome: Progressing   Problem: Activity: Goal: Risk for activity intolerance will decrease Outcome: Progressing   Problem: Nutrition: Goal: Adequate nutrition will be maintained Outcome: Progressing   Problem: Coping: Goal: Level of anxiety will decrease Outcome: Progressing   Problem: Pain Managment: Goal: General experience of comfort will improve and/or be controlled Outcome: Progressing   Problem: Safety: Goal: Ability to remain free from injury will improve Outcome: Progressing

## 2024-05-04 NOTE — Progress Notes (Signed)
 Patient bedrest ended at 1700, attempted to ambulate patient to bathroom, patient stood up at side of bed and RN checked bilateral groin sites, noted to have some bleeding from right groin, pressure held, dressing changed and bleeding subsided, no hematoma noted. Patient then laid flat for an hour. MD Kennyth notified and came to bedside. Patient to be admitted.  Groin sites checked continuously, still clean,dry, intact and soft at this time.

## 2024-05-04 NOTE — Discharge Instructions (Signed)

## 2024-05-04 NOTE — Transfer of Care (Signed)
 Immediate Anesthesia Transfer of Care Note  Patient: Terry [name]  Procedure(s) Performed: ATRIAL FIBRILLATION ABLATION  Patient Location: PACU  Anesthesia Type:General  Level of Consciousness: awake, alert , and oriented  Airway & Oxygen  Therapy: Patient Spontanous Breathing and Patient connected to face mask oxygen   Post-op Assessment: Report given to RN and Post -op Vital signs reviewed and stable  Post vital signs: Reviewed and stable  Last Vitals:  Vitals Value Taken Time  BP 100/74 05/04/24 13:20  Temp 36.6 C 05/04/24 13:20  Pulse 81 05/04/24 13:26  Resp 19 05/04/24 13:26  SpO2 98 % 05/04/24 13:26  Vitals shown include unfiled device data.  Last Pain:  Vitals:   05/04/24 1320  TempSrc: Oral  PainSc: 0-No pain      Patients Stated Pain Goal: 4 (05/04/24 1100)  Complications: There were no known notable events for this encounter.

## 2024-05-04 NOTE — Anesthesia Postprocedure Evaluation (Signed)
 Anesthesia Post Note  Patient: Terry [name]  Procedure(s) Performed: ATRIAL FIBRILLATION ABLATION     Patient location during evaluation: PACU Anesthesia Type: General Level of consciousness: awake and alert Pain management: pain level controlled Vital Signs Assessment: post-procedure vital signs reviewed and stable Respiratory status: spontaneous breathing, nonlabored ventilation, respiratory function stable and patient connected to nasal cannula oxygen  Cardiovascular status: blood pressure returned to baseline and stable Postop Assessment: no apparent nausea or vomiting Anesthetic complications: no   There were no known notable events for this encounter.  Last Vitals:  Vitals:   05/04/24 1415 05/04/24 1430  BP: (!) 104/90 100/66  Pulse: 84 86  Resp: (!) 8 (!) 21  Temp:    SpO2: 96% 97%    Last Pain:  Vitals:   05/04/24 1406  TempSrc:   PainSc: 0-No pain                 Franky JONETTA Bald

## 2024-05-04 NOTE — Anesthesia Preprocedure Evaluation (Addendum)
 Anesthesia Evaluation  Patient identified by MRN, date of birth, ID band Patient awake    Reviewed: Allergy & Precautions, NPO status , Patient's Chart, lab work & pertinent test results  Airway Mallampati: III  TM Distance: >3 FB Neck ROM: Full    Dental  (+) Teeth Intact, Dental Advisory Given   Pulmonary asthma    breath sounds clear to auscultation       Cardiovascular hypertension, Pt. on medications and Pt. on home beta blockers  Rhythm:Irregular Rate:Normal  Echo:  1. Left ventricular ejection fraction, by estimation, is 60 to 65%. The  left ventricle has normal function. The left ventricle has no regional  wall motion abnormalities. Left ventricular diastolic parameters are  consistent with Grade I diastolic  dysfunction (impaired relaxation).   2. Right ventricular systolic function is normal. The right ventricular  size is normal. There is normal pulmonary artery systolic pressure.   3. Left atrial size was mildly dilated.   4. Right atrial size was moderately dilated.   5. The mitral valve is normal in structure. Trivial mitral valve  regurgitation. No evidence of mitral stenosis.   6. The aortic valve is tricuspid. Aortic valve regurgitation is not  visualized. No aortic stenosis is present.   7. The inferior vena cava is normal in size with greater than 50%  respiratory variability, suggesting right atrial pressure of 3 mmHg.     Neuro/Psych  PSYCHIATRIC DISORDERS      negative neurological ROS     GI/Hepatic Neg liver ROS,GERD  ,,  Endo/Other  diabetes, Type 2, Oral Hypoglycemic Agents    Renal/GU negative Renal ROS     Musculoskeletal negative musculoskeletal ROS (+)    Abdominal   Peds  Hematology negative hematology ROS (+)   Anesthesia Other Findings   Reproductive/Obstetrics negative OB ROS                              Anesthesia Physical Anesthesia Plan  ASA:  3  Anesthesia Plan: General   Post-op Pain Management: Minimal or no pain anticipated   Induction: Intravenous  PONV Risk Score and Plan: 0 and Ondansetron  and Treatment may vary due to age or medical condition  Airway Management Planned: Oral ETT  Additional Equipment: None  Intra-op Plan:   Post-operative Plan: Extubation in OR  Informed Consent: I have reviewed the patients History and Physical, chart, labs and discussed the procedure including the risks, benefits and alternatives for the proposed anesthesia with the patient or authorized representative who has indicated his/her understanding and acceptance.     Dental advisory given  Plan Discussed with: CRNA  Anesthesia Plan Comments:          Anesthesia Quick Evaluation

## 2024-05-05 ENCOUNTER — Encounter (HOSPITAL_COMMUNITY): Payer: Self-pay | Admitting: Cardiology

## 2024-05-05 DIAGNOSIS — I4819 Other persistent atrial fibrillation: Secondary | ICD-10-CM | POA: Diagnosis not present

## 2024-05-05 NOTE — Discharge Summary (Signed)
 ELECTROPHYSIOLOGY PROCEDURE DISCHARGE SUMMARY    Patient ID: Terry [name],  MRN: [id number], DOB/AGE: 1970/10/22 53 y.o.  Admit date: 05/04/2024 Discharge date: 05/05/2024  Primary Care Physician: Ozell Heron HERO, MD  Primary Cardiologist: Lurena MARLA Red, MD  Electrophysiologist: Dr. Kennyth   Primary Discharge Diagnosis:  Atrial Fibrillation  Secondary Discharge Diagnosis:  HTN HLD CAD CLL  Procedures This Admission:  1.  Pulmonary vein isolation, 3D electroanatomic mapping of atrial fibrillation and EP study on 05/04/2024 by Dr. Kennyth. EP study showed: baseline intervals were: PR , QRS 75ms, QT , RR , AH 51ms, HV 46ms. VA wenckebach was . AV wenckebach was . AVN ERP was 500/247ms. Extra-stimuli were delivered and decremental pacing was performed without induction of any arrhythmias.  ICE exam showed stable LV function and no change in baseline pericardial effusion.   CONCLUSIONS: 1. Successful PVI. 2. Intracardiac echo reveals normal LV size and function, trivial pericardial effusion. 3. No early apparent complications. 4. Resume Xarelto  in recovery area.    Brief HPI: Terry [name] is a 53 y.o. 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 They have failed medical therapy with Lopressor  (fatigue). Risks, benefits, and alternatives to catheter ablation of Atrial Fibrillation were reviewed with the patient who wished to proceed.   The patient has been on uninterrupted anticoagulation for more than 3 weeks and did not require TEE.  Hospital Course:  The patient was admitted and underwent EPS and ablation of Atrial Fibrillation with details as outlined above. They were monitored on telemetry overnight which demonstrated NSR. Groin was without complication on the day of discharge. The patient was examined and considered to be stable for discharge. Wound care  and restrictions were reviewed with the patient. The patient will be seen back by Afib Clinic in 4 weeks and EP APP in 12 weeks for post ablation follow up.   Discharge Vitals: Vitals:   05/04/24 2122 05/05/24 0005 05/05/24 0405 05/05/24 0825  BP: 122/79 111/62 104/71 124/77  Pulse:  93 75   Resp:  16 20 19   Temp:  98.2 F (36.8 C) 98.5 F (36.9 C) 98.7 F (37.1 C)  TempSrc:  Oral Oral Oral  SpO2:  97% 97%   Weight:      Height:        Discharge Medications:  Allergies as of 05/05/2024   No Known Allergies      Medication List     STOP taking these medications    metoprolol  succinate 25 MG 24 hr tablet Commonly known as: Toprol  XL       TAKE these medications    albuterol  108 (90 Base) MCG/ACT inhaler Commonly known as: VENTOLIN  HFA INHALE 1 TO 2 PUFFS INTO THE LUNGS EVERY 6 HOURS AS NEEDED FOR WHEEZING OR SHORTNESS OF BREATH   blood glucose meter kit and supplies Kit Dispense based on patient and insurance preference. Use up to four times daily as directed.   Comfort Touch BP Cuff/Large Misc 1 each by Does not apply route daily as needed.   cyclobenzaprine  10 MG tablet Commonly known as: FLEXERIL  Take 1 tablet (10 mg total) by mouth at bedtime.   diltiazem  120 MG 24 hr capsule Commonly known as: CARDIZEM  CD Take 1 capsule (120 mg total) by mouth daily.   E-Z Spacer inhaler Use as instructed   Spacer/Aero-Holding Raguel French 1 Device by Does not apply route daily as needed.  lisinopril  10 MG tablet Commonly known as: ZESTRIL  TAKE 1 TABLET(10 MG) BY MOUTH DAILY   Magnesium  400 MG Tabs Take 400 mg by mouth daily.   metFORMIN  500 MG 24 hr tablet Commonly known as: GLUCOPHAGE -XR TAKE 1 TABLET(500 MG) BY MOUTH DAILY WITH BREAKFAST   MIRALAX PO Take 238 g by mouth.   modafinil  200 MG tablet Commonly known as: PROVIGIL  Take 0.5-1 tablets (100-200 mg total) by mouth daily. What changed:  when to take this reasons to take this    multivitamin-iron-minerals-folic acid  chewable tablet Chew 1 tablet by mouth daily.   rivaroxaban  20 MG Tabs tablet Commonly known as: Xarelto  Take 1 tablet (20 mg total) by mouth daily with supper.   rosuvastatin  10 MG tablet Commonly known as: CRESTOR  TAKE 1 TABLET(10 MG) BY MOUTH DAILY   sildenafil  100 MG tablet Commonly known as: VIAGRA  TAKE 1/2 TABLET(50 MG) BY MOUTH DAILY AS NEEDED FOR ERECTILE DYSFUNCTION   tamsulosin  0.4 MG Caps capsule Commonly known as: FLOMAX  TAKE 1 CAPSULE(0.4 MG) BY MOUTH DAILY   valACYclovir  1000 MG tablet Commonly known as: VALTREX  Take 2 tablets PO BID x 1 day at onset of symptoms. What changed:  how much to take how to take this when to take this reasons to take this   Xyosted  100 MG/0.5ML Soaj Generic drug: Testosterone  Enanthate INJECT 0.5 ML( 100 MG TOTAL) UNDER THE SKIN ONCE A WEEK        Disposition: Home with usual follow up as in AVS  Duration of Discharge Encounter:  APP time: 20 minutes  Signed, Laymon CHRISTELLA Qua, PA-C  05/05/2024 9:16 AM  \

## 2024-05-05 NOTE — Progress Notes (Addendum)
 Discharge   PT verbalized discharge POC.  Additional education reviewed and included in AVS. NO TOC meds.   IV removed drs intact. Dsg to left groing CDI.  Tele removed CCMD/ HOLLY.  Transferring to lounge [facility] [employer]

## 2024-05-05 NOTE — Progress Notes (Signed)
   Rounding Note    Patient Name: Terry [name] Date of Encounter: 05/05/2024  [facility] Cardiologist: Arun K Thukkani, MD   Subjective   No acute events overnight.  Feels well this morning.  Family at bedside.  Vital Signs    Vitals:   05/04/24 2122 05/05/24 0005 05/05/24 0405 05/05/24 0825  BP: 122/79 111/62 104/71 124/77  Pulse:  93 75   Resp:  16 20 19   Temp:  98.2 F (36.8 C) 98.5 F (36.9 C) 98.7 F (37.1 C)  TempSrc:  Oral Oral Oral  SpO2:  97% 97%   Weight:      Height:        Intake/Output Summary (Last 24 hours) at 05/05/2024 0840 Last data filed at 05/04/2024 1248 Gross per 24 hour  Intake --  Output 5 ml  Net -5 ml      05/04/2024   11:04 AM 04/24/2024    2:06 PM 03/27/2024    8:27 AM  Last 3 Weights  Weight (lbs) 205 lb 209 lb 3.2 oz 202 lb 6.4 oz  Weight (kg) 92.987 kg 94.892 kg 91.808 kg      Telemetry    Sinus rhythm- Personally Reviewed  Physical Exam   GEN: No acute distress.   Cardiac: RRR, no murmurs, rubs, or gallops.  Femoral venous access sites healing well.  No hematoma or pain. Respiratory: Clear to auscultation bilaterally. Psych: Normal affect   Assessment & Plan    #Atrial fibrillation Doing well after catheter ablation yesterday.  Groin sites healing well. Okay to discharge.    Ole T. Cindie, MD, [facility], [facility] Cardiac Electrophysiology

## 2024-05-06 ENCOUNTER — Ambulatory Visit: Payer: Self-pay | Admitting: Cardiology

## 2024-05-06 ENCOUNTER — Encounter (HOSPITAL_COMMUNITY): Payer: Self-pay | Admitting: Cardiology

## 2024-05-07 MED FILL — Midazolam HCl Inj 2 MG/2ML (Base Equivalent): INTRAMUSCULAR | Qty: 2 | Status: AC

## 2024-05-07 MED FILL — Fentanyl Citrate Preservative Free (PF) Inj 100 MCG/2ML: INTRAMUSCULAR | Qty: 1 | Status: AC

## 2024-05-16 ENCOUNTER — Encounter: Payer: Self-pay | Admitting: *Deleted

## 2024-05-16 ENCOUNTER — Ambulatory Visit: Admitting: Neurology

## 2024-06-01 ENCOUNTER — Ambulatory Visit (HOSPITAL_COMMUNITY)
Admission: RE | Admit: 2024-06-01 | Discharge: 2024-06-01 | Disposition: A | Discharge status: Home / Self Care | Source: Ambulatory Visit | Attending: Physician Assistant | Admitting: Physician Assistant

## 2024-06-01 VITALS — BP 114/76 | HR 69 | Ht 69.0 in | Wt 217.4 lb

## 2024-06-01 DIAGNOSIS — D6869 Other thrombophilia: Secondary | ICD-10-CM | POA: Diagnosis not present

## 2024-06-01 DIAGNOSIS — I4819 Other persistent atrial fibrillation: Secondary | ICD-10-CM

## 2024-06-01 DIAGNOSIS — I10 Essential (primary) hypertension: Secondary | ICD-10-CM

## 2024-06-01 DIAGNOSIS — I4891 Unspecified atrial fibrillation: Secondary | ICD-10-CM

## 2024-06-01 MED ORDER — LISINOPRIL 10 MG PO TABS: 10.0000 mg | ORAL_TABLET | Freq: Every day | ORAL | 0 refills | Status: AC

## 2024-06-01 NOTE — Progress Notes (Signed)
 Primary Care Physician: Ozell Heron HERO, MD Primary Cardiologist: Lurena MARLA Red, MD Electrophysiologist: Fonda Kitty, MD  Referring Physician: Rosaline Bane NP   Terry [name] is a 52 y.o. male with a history of CAD/aortic atherosclerosis, OSA, HLD, CLL, DM, atrial fibrillation who presents for follow up in the [facility].  The patient was initially diagnosed with atrial fibrillation at a visit with his PCP 11/2021. Patient is on Xarelto  for stroke prevention. Seen by Rosaline Bane 02/27/24 and started on metoprolol . He underwent DCCV on 03/29/24 but was back in afib at his visit with Dr Kitty. He is s/p afib ablation 05/04/24.  Patient returns for follow up for atrial fibrillation. He reports that he has done well since his ablation. He is in SR today and feels well. He denies any interim symptoms of afib. No chest pain or groin issues. No bleeding issues on anticoagulation.   Today, he  denies symptoms of palpitations, chest pain, shortness of breath, orthopnea, PND, lower extremity edema, dizziness, presyncope, syncope, bleeding, or neurologic sequela. The patient is tolerating medications without difficulties and is otherwise without complaint today.    Atrial Fibrillation Risk Factors:  he does have symptoms or diagnosis of sleep apnea. he does not have a history of rheumatic fever.   Atrial Fibrillation Management history:  Previous antiarrhythmic drugs: none Previous cardioversions: none Previous ablations: 05/04/24 Anticoagulation history: Eliquis , Xarelto   ROS- All systems are reviewed and negative except as per the HPI above.  Past Medical History:  Diagnosis Date   Asthma    Cancer (HCC)    Diabetes mellitus without complication (HCC)    GERD (gastroesophageal reflux disease)    Hyperlipidemia    Hypertension    Opioid abuse (HCC)    following with methadone  clinic   Pneumonia     Current Outpatient Medications   Medication Sig Dispense Refill   albuterol  (VENTOLIN  HFA) 108 (90 Base) MCG/ACT inhaler INHALE 1 TO 2 PUFFS INTO THE LUNGS EVERY 6 HOURS AS NEEDED FOR WHEEZING OR SHORTNESS OF BREATH (Patient taking differently: 2 puffs as needed.) 20.1 g 1   blood glucose meter kit and supplies KIT Dispense based on patient and insurance preference. Use up to four times daily as directed. 1 each 0   Blood Pressure Monitoring (COMFORT TOUCH BP CUFF/LARGE) MISC 1 each by Does not apply route daily as needed. 1 each 0   cyclobenzaprine  (FLEXERIL ) 10 MG tablet Take 1 tablet (10 mg total) by mouth at bedtime. (Patient taking differently: Take 10 mg by mouth as needed.) 30 tablet 2   diltiazem  (CARDIZEM  CD) 120 MG 24 hr capsule Take 1 capsule (120 mg total) by mouth daily. 90 capsule 3   metFORMIN  (GLUCOPHAGE -XR) 500 MG 24 hr tablet TAKE 1 TABLET(500 MG) BY MOUTH DAILY WITH BREAKFAST 90 tablet 1   modafinil  (PROVIGIL ) 200 MG tablet Take 0.5-1 tablets (100-200 mg total) by mouth daily. 30 tablet 5   multivitamin-iron-minerals-folic acid  (CENTRUM) chewable tablet Chew 1 tablet by mouth daily.     Polyethylene Glycol 3350 (MIRALAX PO) Take 238 g by mouth. (Patient taking differently: Take 238 g by mouth as needed.)     rivaroxaban  (XARELTO ) 20 MG TABS tablet Take 1 tablet (20 mg total) by mouth daily with supper. 90 tablet 1   rosuvastatin  (CRESTOR ) 10 MG tablet TAKE 1 TABLET(10 MG) BY MOUTH DAILY 30 tablet 2   sildenafil  (VIAGRA ) 100 MG tablet TAKE 1/2 TABLET(50 MG) BY MOUTH DAILY AS NEEDED  FOR ERECTILE DYSFUNCTION (Patient taking differently: Take 50 mg by mouth as needed. TAKE 1/2 TABLET(50 MG) BY MOUTH DAILY AS NEEDED FOR ERECTILE DYSFUNCTION) 30 tablet 0   Spacer/Aero-Holding Chambers (E-Z SPACER) inhaler Use as instructed 1 each 2   Spacer/Aero-Holding Chambers DEVI 1 Device by Does not apply route daily as needed. 1 each 0   tamsulosin  (FLOMAX ) 0.4 MG CAPS capsule TAKE 1 CAPSULE(0.4 MG) BY MOUTH DAILY 90 capsule 0    Testosterone  Enanthate (XYOSTED ) 100 MG/0.5ML SOAJ INJECT 0.5 ML( 100 MG TOTAL) UNDER THE SKIN ONCE A WEEK 2 mL 2   valACYclovir  (VALTREX ) 1000 MG tablet Take 2 tablets PO BID x 1 day at onset of symptoms. 30 tablet 0   lisinopril  (ZESTRIL ) 10 MG tablet Take 1 tablet (10 mg total) by mouth daily. 90 tablet 0   No current facility-administered medications for this encounter.    Physical Exam: BP 114/76   Pulse 69   Ht 5' 9 (1.753 m)   Wt 98.6 kg   BMI 32.10 kg/m   GEN: Well nourished, well developed in no acute distress CARDIAC: Regular rate and rhythm, no murmurs, rubs, gallops RESPIRATORY:  Clear to auscultation without rales, wheezing or rhonchi  ABDOMEN: Soft, non-tender, non-distended EXTREMITIES:  No edema; No deformity    Wt Readings from Last 3 Encounters:  06/01/24 98.6 kg  05/04/24 93 kg  04/24/24 94.9 kg     EKG today demonstrates  SR Vent. rate 69 BPM PR interval 148 ms QRS duration 84 ms QT/QTcB 386/413 ms   Echo 12/21/21 demonstrated   1. Left ventricular ejection fraction, by estimation, is 60 to 65%. The  left ventricle has normal function. The left ventricle has no regional  wall motion abnormalities. Left ventricular diastolic parameters are  consistent with Grade I diastolic dysfunction (impaired relaxation).   2. Right ventricular systolic function is normal. The right ventricular  size is normal. There is normal pulmonary artery systolic pressure.   3. Left atrial size was mildly dilated.   4. Right atrial size was moderately dilated.   5. The mitral valve is normal in structure. Trivial mitral valve  regurgitation. No evidence of mitral stenosis.   6. The aortic valve is tricuspid. Aortic valve regurgitation is not  visualized. No aortic stenosis is present.   7. The inferior vena cava is normal in size with greater than 50%  respiratory variability, suggesting right atrial pressure of 3 mmHg.    CHA2DS2-VASc Score = 2  The patient's score is  based upon: CHF History: 0 HTN History: 0 Diabetes History: 1 Stroke History: 0 Vascular Disease History: 1 Age Score: 0 Gender Score: 0       ASSESSMENT AND PLAN: Persistent Atrial Fibrillation (ICD10:  I48.19) The patient's CHA2DS2-VASc score is 2, indicating a 2.2% annual risk of stroke.   S/p afib ablation 05/04/24 Patient appears to be maintaining SR Continue diltiazem  120 mg daily Continue Xarelto  20 mg daily with no missed doses for 3 months post ablation.   Secondary Hypercoagulable State (ICD10:  D68.69) The patient is at significant risk for stroke/thromboembolism based upon his CHA2DS2-VASc Score of 2.  Continue Rivaroxaban  (Xarelto ). No bleeding issues.   CAD No anginal symptoms Followed by Dr Wendel  [facility]  Encouraged him to reach out to medical equipment company to get CPAP.    Follow up with Daphne Barrack as scheduled.    [facility] [address] [place], [place] [address] [phone]

## 2024-06-04 ENCOUNTER — Encounter: Payer: Self-pay | Admitting: Radiology

## 2024-06-12 ENCOUNTER — Telehealth: Payer: Self-pay | Admitting: Neurology

## 2024-06-12 NOTE — Telephone Encounter (Signed)
 Called pt and LVM stating that he is needing to schedule his Initial Cpap visit. DME in pt's SnapShot. Between dates are 07/08/2024-09/06/2024

## 2024-06-19 NOTE — Telephone Encounter (Signed)
 Second attempt made on today to try and schedule pt's initial cpap f/u.DME in pt's SnapShot. Between dates are 07/08/2024-09/06/2024

## 2024-07-11 ENCOUNTER — Other Ambulatory Visit: Payer: Self-pay | Admitting: Family Medicine

## 2024-07-11 ENCOUNTER — Ambulatory Visit: Payer: Self-pay

## 2024-07-11 NOTE — Telephone Encounter (Signed)
 FYI Only or Action Required?: FYI only for provider: ED advised.- unsure if he will go. States he just needs the script and a follow up appt  Patient was last seen in primary care on 10/07/2023 by Ozell Heron HERO, MD.  Called Nurse Triage reporting Urine Output.  Symptoms began yesterday.  Interventions attempted: Nothing.  Symptoms are: gradually worsening.  Triage Disposition: Go to ED Now (Notify PCP)  Patient/caregiver understands and will follow disposition?: Unsure  Copied from CRM #[id number]. Topic: Clinical - Red Word Triage >> Jul 11, 2024  4:48 PM Jayma L wrote: Red Word that prompted transfer to Nurse Triage: can't urinate. Started yesterday. Tamsulosin  0.4 mgs been without 2 days Reason for Disposition  [1] Unable to urinate (or only a few drops) > 4 hours AND [2] bladder feels very full (e.g., palpable bladder or strong urge to urinate)  Answer Assessment - Initial Assessment Questions Decreased urination about half of what he normally does. States he ran out of tamsulosin  and got busy and forgot to call it in before. He states he just needs the script filled and a follow up appt with Dr. Ozell. Due to decreased ability to urinate and a full bladder, RN's recommendation was for pt to go to the ER to be evaluated to make sure his bladder isn't too full of urine. Pt stated understanding. Please send script for tamsulosin .    1. SYMPTOM: What's the main symptom you're concerned about? (e.g., frequency, incontinence)     Decreased urination 2. ONSET: When did the  decreased urination  start?     Last 2 days, he states he knows tomorrow will be worse 3. PAIN: Is there any pain? If Yes, ask: How bad is it? (Scale: 1-10; mild, moderate, severe)     no 4. CAUSE: What do you think is causing the symptoms?     He ran out of tamsulosin  5. OTHER SYMPTOMS: Do you have any other symptoms? (e.g., blood in urine, fever, flank pain, pain with urination)     Bladder feels  very full  Protocols used: Urinary Symptoms-A-AH

## 2024-07-12 NOTE — Telephone Encounter (Signed)
 Patient informed of the message below and voiced understanding

## 2024-07-12 NOTE — Telephone Encounter (Signed)
 Pt has missed multiple visits with me, please have him schedule an appointment after he is evaluated in the ED so we can follow up.

## 2024-07-20 ENCOUNTER — Other Ambulatory Visit: Payer: Self-pay | Admitting: Family Medicine

## 2024-07-20 DIAGNOSIS — E7800 Pure hypercholesterolemia, unspecified: Secondary | ICD-10-CM

## 2024-07-23 ENCOUNTER — Encounter: Payer: Self-pay | Admitting: Family Medicine

## 2024-07-23 ENCOUNTER — Ambulatory Visit: Admitting: Family Medicine

## 2024-07-23 VITALS — BP 120/60 | HR 90 | Temp 98.1°F | Ht 68.5 in | Wt 222.0 lb

## 2024-07-23 DIAGNOSIS — Z7985 Long-term (current) use of injectable non-insulin antidiabetic drugs: Secondary | ICD-10-CM

## 2024-07-23 DIAGNOSIS — Z23 Encounter for immunization: Secondary | ICD-10-CM | POA: Diagnosis not present

## 2024-07-23 DIAGNOSIS — E1165 Type 2 diabetes mellitus with hyperglycemia: Secondary | ICD-10-CM | POA: Diagnosis not present

## 2024-07-23 DIAGNOSIS — E1169 Type 2 diabetes mellitus with other specified complication: Secondary | ICD-10-CM | POA: Diagnosis not present

## 2024-07-23 DIAGNOSIS — Z0000 Encounter for general adult medical examination without abnormal findings: Secondary | ICD-10-CM | POA: Diagnosis not present

## 2024-07-23 DIAGNOSIS — R7989 Other specified abnormal findings of blood chemistry: Secondary | ICD-10-CM

## 2024-07-23 DIAGNOSIS — E785 Hyperlipidemia, unspecified: Secondary | ICD-10-CM

## 2024-07-23 DIAGNOSIS — N400 Enlarged prostate without lower urinary tract symptoms: Secondary | ICD-10-CM | POA: Diagnosis not present

## 2024-07-23 LAB — POCT GLYCOSYLATED HEMOGLOBIN (HGB A1C): Hemoglobin A1C: 6.6 % — AB (ref 4.0–5.6)

## 2024-07-23 MED ORDER — TAMSULOSIN HCL 0.4 MG PO CAPS: 0.4000 mg | ORAL_CAPSULE | Freq: Every day | ORAL | 1 refills | Status: AC

## 2024-07-23 MED ORDER — TIRZEPATIDE 2.5 MG/0.5ML ~~LOC~~ SOAJ: 2.5000 mg | SUBCUTANEOUS | 2 refills | Status: AC

## 2024-07-23 NOTE — Patient Instructions (Signed)
 Health Maintenance, Male  Adopting a healthy lifestyle and getting preventive care are important in promoting health and wellness. Ask your health care provider about:  The right schedule for you to have regular tests and exams.  Things you can do on your own to prevent diseases and keep yourself healthy.  What should I know about diet, weight, and exercise?  Eat a healthy diet    Eat a diet that includes plenty of vegetables, fruits, low-fat dairy products, and lean protein.  Do not eat a lot of foods that are high in solid fats, added sugars, or sodium.  Maintain a healthy weight  Body mass index (BMI) is a measurement that can be used to identify possible weight problems. It estimates body fat based on height and weight. Your health care provider can help determine your BMI and help you achieve or maintain a healthy weight.  Get regular exercise  Get regular exercise. This is one of the most important things you can do for your health. Most adults should:  Exercise for at least 150 minutes each week. The exercise should increase your heart rate and make you sweat (moderate-intensity exercise).  Do strengthening exercises at least twice a week. This is in addition to the moderate-intensity exercise.  Spend less time sitting. Even light physical activity can be beneficial.  Watch cholesterol and blood lipids  Have your blood tested for lipids and cholesterol at 51 years of age, then have this test every 5 years.  You may need to have your cholesterol levels checked more often if:  Your lipid or cholesterol levels are high.  You are older than 51 years of age.  You are at high risk for heart disease.  What should I know about cancer screening?  Many types of cancers can be detected early and may often be prevented. Depending on your health history and family history, you may need to have cancer screening at various ages. This may include screening for:  Colorectal cancer.  Prostate cancer.  Skin cancer.  Lung  cancer.  What should I know about heart disease, diabetes, and high blood pressure?  Blood pressure and heart disease  High blood pressure causes heart disease and increases the risk of stroke. This is more likely to develop in people who have high blood pressure readings or are overweight.  Talk with your health care provider about your target blood pressure readings.  Have your blood pressure checked:  Every 3-5 years if you are 24-52 years of age.  Every year if you are 3 years old or older.  If you are between the ages of 60 and 72 and are a current or former smoker, ask your health care provider if you should have a one-time screening for abdominal aortic aneurysm (AAA).  Diabetes  Have regular diabetes screenings. This checks your fasting blood sugar level. Have the screening done:  Once every three years after age 66 if you are at a normal weight and have a low risk for diabetes.  More often and at a younger age if you are overweight or have a high risk for diabetes.  What should I know about preventing infection?  Hepatitis B  If you have a higher risk for hepatitis B, you should be screened for this virus. Talk with your health care provider to find out if you are at risk for hepatitis B infection.  Hepatitis C  Blood testing is recommended for:  Everyone born from [birth year] through 1963.  Anyone  with known risk factors for hepatitis C.  Sexually transmitted infections (STIs)  You should be screened each year for STIs, including gonorrhea and chlamydia, if:  You are sexually active and are younger than 51 years of age.  You are older than 51 years of age and your health care provider tells you that you are at risk for this type of infection.  Your sexual activity has changed since you were last screened, and you are at increased risk for chlamydia or gonorrhea. Ask your health care provider if you are at risk.  Ask your health care provider about whether you are at high risk for HIV. Your health care provider  may recommend a prescription medicine to help prevent HIV infection. If you choose to take medicine to prevent HIV, you should first get tested for HIV. You should then be tested every 3 months for as long as you are taking the medicine.  Follow these instructions at home:  Alcohol use  Do not drink alcohol if your health care provider tells you not to drink.  If you drink alcohol:  Limit how much you have to 0-2 drinks a day.  Know how much alcohol is in your drink. In the [place] one drink equals one 12 oz bottle of beer (355 mL), one 5 oz glass of wine (148 mL), or one 1 oz glass of hard liquor (44 mL).  Lifestyle  Do not use any products that contain nicotine or tobacco. These products include cigarettes, chewing tobacco, and vaping devices, such as e-cigarettes. If you need help quitting, ask your health care provider.  Do not use street drugs.  Do not share needles.  Ask your health care provider for help if you need support or information about quitting drugs.  General instructions  Schedule regular health, dental, and eye exams.  Stay current with your vaccines.  Tell your health care provider if:  You often feel depressed.  You have ever been abused or do not feel safe at home.  Summary  Adopting a healthy lifestyle and getting preventive care are important in promoting health and wellness.  Follow your health care provider's instructions about healthy diet, exercising, and getting tested or screened for diseases.  Follow your health care provider's instructions on monitoring your cholesterol and blood pressure.  This information is not intended to replace advice given to you by your health care provider. Make sure you discuss any questions you have with your health care provider.  Document Revised: 12/08/2020 Document Reviewed: 12/08/2020  Elsevier Patient Education  2024 [employer].

## 2024-07-23 NOTE — Progress Notes (Unsigned)
 "  Complete physical exam  Patient: Terry [name]   DOB: [birth year]   53 y.o. Male  MRN: [id number]  Subjective:    Chief Complaint  Patient presents with   Annual Exam    Terry [name] is a 53 y.o. male who presents today for a complete physical exam. He reports consuming a {diet types:17450} diet. {types:19826} He generally feels {DESC; WELL/FAIRLY WELL/POORLY:18703}. He reports sleeping {DESC; WELL/FAIRLY WELL/POORLY:18703}. He {does/does not:200015} have additional problems to discuss today.    Most recent fall risk assessment:     No data to display           Most recent depression screenings:    07/23/2024    2:52 PM 02/08/2023    9:44 AM  PHQ 2/9 Scores  PHQ - 2 Score 0 0  PHQ- 9 Score 3 1      Data saved with a previous flowsheet row definition    {VISON DENTAL STD PSA (Optional):27386}  {History (Optional):23778}  Patient Care Team: Ozell Heron HERO, MD as PCP - General (Family Medicine) Thukkani, Arun K, MD as PCP - Cardiology (Cardiology) Kennyth Chew, MD as PCP - Electrophysiology (Cardiology)   Show/hide medication list[1]  ROS     Objective:     BP 120/60   Pulse 90   Temp 98.1 F (36.7 C) (Oral)   Ht 5' 8.5 (1.74 m)   Wt 222 lb (100.7 kg)   SpO2 97%   BMI 33.26 kg/m  {Vitals History (Optional):23777}  Physical Exam Vitals reviewed.  Constitutional:      Appearance: Normal appearance. He is well-groomed. He is obese.  HENT:     Right Ear: Tympanic membrane and ear canal normal.     Left Ear: Tympanic membrane and ear canal normal.     Mouth/Throat:     Mouth: Mucous membranes are moist.     Pharynx: No posterior oropharyngeal erythema.  Eyes:     Extraocular Movements: Extraocular movements intact.     Conjunctiva/sclera: Conjunctivae normal.  Neck:     Thyroid : No thyromegaly.  Cardiovascular:     Rate and Rhythm: Normal rate and regular rhythm.     Heart sounds: S1 normal and S2 normal. No murmur  heard. Pulmonary:     Effort: Pulmonary effort is normal.     Breath sounds: Normal breath sounds and air entry. No rales.  Abdominal:     General: Abdomen is flat. Bowel sounds are normal.  Musculoskeletal:     Right lower leg: No edema.     Left lower leg: No edema.  Lymphadenopathy:     Cervical: No cervical adenopathy.  Neurological:     General: No focal deficit present.     Mental Status: He is alert and oriented to person, place, and time.     Gait: Gait is intact.  Psychiatric:        Mood and Affect: Mood and affect normal.      Results for orders placed or performed in visit on 07/23/24  POC HgB A1c  Result Value Ref Range   Hemoglobin A1C 6.6 (A) 4.0 - 5.6 %   HbA1c POC (<> result, manual entry)     HbA1c, POC (prediabetic range)     HbA1c, POC (controlled diabetic range)     {Show previous labs (optional):23779}    Assessment & Plan:    Routine Health Maintenance and Physical Exam  Immunization History  Administered Date(s) Administered   Influenza, Seasonal, Injecte, Preservative Fre  05/11/2023   Influenza,inj,Quad PF,6+ Mos 10/13/2018, 09/21/2019, 09/15/2020, 05/13/2021   Moderna Sars-Covid-2 Vaccination 11/01/2019, 11/30/2019, 04/02/2020   PFIZER(Purple Top)SARS-COV-2 Vaccination 10/19/2019, 11/09/2019, 08/05/2020   Pneumococcal Conjugate-13 10/13/2018, 09/21/2019    Health Maintenance  Topic Date Due   Diabetic kidney evaluation - Urine ACR  Never done   DTaP/Tdap/Td (1 - Tdap) Never done   Hepatitis B Vaccines 19-59 Average Risk (1 of 3 - 19+ 3-dose series) Never done   Zoster Vaccines- Shingrix  (1 of 2) Never done   Pneumococcal Vaccine: 50+ Years (2 of 2 - PPSV23, PCV20, or PCV21) 11/16/2019   FOOT EXAM  02/08/2024   Influenza Vaccine  03/02/2024   COVID-19 Vaccine (7 - 2025-26 season) 04/02/2024   HEMOGLOBIN A1C  01/21/2025   OPHTHALMOLOGY EXAM  04/03/2025   Diabetic kidney evaluation - eGFR measurement  05/02/2025   Fecal DNA (Cologuard)   01/12/2026   Hepatitis C Screening  Completed   HIV Screening  Completed   HPV VACCINES  Aged Out   Meningococcal B Vaccine  Aged Out    Discussed health benefits of physical activity, and encouraged him to engage in regular exercise appropriate for his age and condition.  Controlled type 2 diabetes mellitus with hyperglycemia, without long-term current use of insulin (HCC) -     POCT glycosylated hemoglobin (Hb A1C) -     Collection capillary blood specimen -     Tirzepatide ; Inject 2.5 mg into the skin once a week.  Dispense: 2 mL; Refill: 2 -     Comprehensive metabolic panel with GFR; Future -     Microalbumin / creatinine urine ratio; Future -     Ambulatory referral to Podiatry  Hyperlipidemia associated with type 2 diabetes mellitus (HCC) -     Lipid panel; Future  Low testosterone  in male -     Testosterone ; Future  Benign prostatic hyperplasia without lower urinary tract symptoms -     Tamsulosin  HCl; Take 1 capsule (0.4 mg total) by mouth daily after breakfast.  Dispense: 90 capsule; Refill: 1  Immunization due -     Flu vaccine trivalent PF, 6mos and older(Flulaval,Afluria,Fluarix,Fluzone) -     Varicella-zoster vaccine IM; Future  Routine general medical examination at a health care facility    Return in about 3 months (around 10/21/2024) for medication refills.     Heron CHRISTELLA Sharper, MD     [1]  Outpatient Medications Prior to Visit  Medication Sig   albuterol  (VENTOLIN  HFA) 108 (90 Base) MCG/ACT inhaler INHALE 1 TO 2 PUFFS INTO THE LUNGS EVERY 6 HOURS AS NEEDED FOR WHEEZING OR SHORTNESS OF BREATH (Patient taking differently: 2 puffs as needed.)   blood glucose meter kit and supplies KIT Dispense based on patient and insurance preference. Use up to four times daily as directed.   Blood Pressure Monitoring (COMFORT TOUCH BP CUFF/LARGE) MISC 1 each by Does not apply route daily as needed.   cyclobenzaprine  (FLEXERIL ) 10 MG tablet Take 1 tablet (10 mg total)  by mouth at bedtime. (Patient taking differently: Take 10 mg by mouth as needed.)   diltiazem  (CARDIZEM  CD) 120 MG 24 hr capsule Take 1 capsule (120 mg total) by mouth daily.   lisinopril  (ZESTRIL ) 10 MG tablet Take 1 tablet (10 mg total) by mouth daily.   metFORMIN  (GLUCOPHAGE -XR) 500 MG 24 hr tablet TAKE 1 TABLET(500 MG) BY MOUTH DAILY WITH BREAKFAST   modafinil  (PROVIGIL ) 200 MG tablet Take 0.5-1 tablets (100-200 mg total) by mouth daily.  multivitamin-iron-minerals-folic acid  (CENTRUM) chewable tablet Chew 1 tablet by mouth daily.   Polyethylene Glycol 3350 (MIRALAX PO) Take 238 g by mouth. (Patient taking differently: Take 238 g by mouth as needed.)   rivaroxaban  (XARELTO ) 20 MG TABS tablet Take 1 tablet (20 mg total) by mouth daily with supper.   rosuvastatin  (CRESTOR ) 10 MG tablet TAKE 1 TABLET(10 MG) BY MOUTH DAILY   sildenafil  (VIAGRA ) 100 MG tablet TAKE 1/2 TABLET(50 MG) BY MOUTH DAILY AS NEEDED FOR ERECTILE DYSFUNCTION (Patient taking differently: Take 50 mg by mouth as needed. TAKE 1/2 TABLET(50 MG) BY MOUTH DAILY AS NEEDED FOR ERECTILE DYSFUNCTION)   Spacer/Aero-Holding Chambers (E-Z SPACER) inhaler Use as instructed   Spacer/Aero-Holding Chambers DEVI 1 Device by Does not apply route daily as needed.   Testosterone  Enanthate (XYOSTED ) 100 MG/0.5ML SOAJ INJECT 0.5 ML( 100 MG TOTAL) UNDER THE SKIN ONCE A WEEK   valACYclovir  (VALTREX ) 1000 MG tablet Take 2 tablets PO BID x 1 day at onset of symptoms.   [DISCONTINUED] tamsulosin  (FLOMAX ) 0.4 MG CAPS capsule TAKE 1 CAPSULE(0.4 MG) BY MOUTH DAILY   No facility-administered medications prior to visit.   "

## 2024-07-25 ENCOUNTER — Other Ambulatory Visit: Payer: Self-pay | Admitting: Family Medicine

## 2024-07-25 DIAGNOSIS — E7800 Pure hypercholesterolemia, unspecified: Secondary | ICD-10-CM

## 2024-07-27 ENCOUNTER — Other Ambulatory Visit

## 2024-08-02 NOTE — Progress Notes (Deleted)
" °  Electrophysiology Office Note:   Date:  08/02/2024  ID:  Terry [name], DOB 09-27-1970, MRN [id number]  Primary Cardiologist: Lurena MARLA Red, MD Primary Heart Failure: None Electrophysiologist: Fonda Kitty, MD  {Click to update primary MD,subspecialty MD or APP then REFRESH:1}    History of Present Illness:   Terry [name] is a 54 y.o. male with h/o AF, HTN, HLD, CAD / aortic atherosclerosis, DM II, OSA, CLL seen today for routine electrophysiology followup.   Since last being seen in our clinic the patient reports doing ***.    He*** denies chest pain, palpitations, dyspnea, PND, orthopnea, nausea, vomiting, dizziness, syncope, edema, weight gain, or early satiety.   Review of systems complete and found to be negative unless listed in HPI.   EP Information / Studies Reviewed:    EKG is ordered today. Personal review as below.       Arrhythmia / AAD / Pertinent EP Studies AF DCCV 03/29/24 > 200j with conversion to NSR  EPS 05/04/24 > PVI ablation    Risk Assessment/Calculations:    CHA2DS2-VASc Score = 2  {Confirm score is correct.  If not, click here to update score.  REFRESH note.  :1} This indicates a 2.2% annual risk of stroke. The patient's score is based upon: CHF History: 0 HTN History: 0 Diabetes History: 1 Stroke History: 0 Vascular Disease History: 1 Age Score: 0 Gender Score: 0   {This patient has a significant risk of stroke if diagnosed with atrial fibrillation.  Please consider VKA or DOAC agent for anticoagulation if the bleeding risk is acceptable.   You can also use the SmartPhrase .[email or web address] for documentation.   :[id number]} No BP recorded.  {Refresh Note OR Click here to enter BP  :1}***        Physical Exam:   VS:  There were no vitals taken for this visit.   Wt Readings from Last 3 Encounters:  07/23/24 222 lb (100.7 kg)  06/01/24 217 lb 6.4 oz (98.6 kg)  05/04/24 205 lb (93 kg)     GEN: Well nourished, well developed in no  acute distress NECK: No JVD; No carotid bruits CARDIAC: {EPRHYTHM:28826}, no murmurs, rubs, gallops RESPIRATORY:  Clear to auscultation without rales, wheezing or rhonchi  ABDOMEN: Soft, non-tender, non-distended EXTREMITIES:  No edema; No deformity   ASSESSMENT AND PLAN:    Persistent Atrial Fibrillation  CHA2DS2-VASc 2, PVI ablation 04/2024  -OAC for stroke prophylaxis -Diltiazem  120 mg daily  -EKG with ***NSR  -no symptom burden post ablation  -monitors with ***  Secondary Hypercoagulable State  -continue Xarelto   OSA  -CPAP compliance encouraged ***    Follow up with {EPMDS:28135::EP Team} {EPFOLLOW LE:71826}  Signed, Terry Barrack, NP-C, AGACNP-BC [facility] - Electrophysiology  08/02/2024, 7:07 PM  "

## 2024-08-03 ENCOUNTER — Ambulatory Visit: Attending: Pulmonary Disease | Admitting: Pulmonary Disease

## 2024-08-03 DIAGNOSIS — I4819 Other persistent atrial fibrillation: Secondary | ICD-10-CM

## 2024-08-03 DIAGNOSIS — I10 Essential (primary) hypertension: Secondary | ICD-10-CM

## 2024-09-28 ENCOUNTER — Other Ambulatory Visit: Payer: BC Managed Care – PPO

## 2024-09-28 ENCOUNTER — Ambulatory Visit: Payer: BC Managed Care – PPO | Admitting: Hematology and Oncology
